# Patient Record
Sex: Male | Born: 1950 | Race: White | Hispanic: No | Marital: Married | Smoking: Current every day smoker
Health system: Southern US, Community
[De-identification: ages and names within clinical notes are randomized; demographics above are authoritative.]

---

## 2000-04-16 ENCOUNTER — Inpatient Hospital Stay (HOSPITAL_COMMUNITY): Admission: EM | Admit: 2000-04-16 | Discharge: 2000-04-17 | Payer: Self-pay | Admitting: Emergency Medicine

## 2000-04-16 ENCOUNTER — Encounter: Payer: Self-pay | Admitting: Orthopedic Surgery

## 2000-04-16 ENCOUNTER — Encounter: Payer: Self-pay | Admitting: *Deleted

## 2000-04-17 ENCOUNTER — Encounter: Payer: Self-pay | Admitting: Cardiology

## 2000-11-18 ENCOUNTER — Ambulatory Visit (HOSPITAL_BASED_OUTPATIENT_CLINIC_OR_DEPARTMENT_OTHER): Admission: RE | Admit: 2000-11-18 | Discharge: 2000-11-18 | Payer: Self-pay | Admitting: Internal Medicine

## 2002-12-22 ENCOUNTER — Encounter: Payer: Self-pay | Admitting: Orthopedic Surgery

## 2002-12-22 ENCOUNTER — Encounter: Admission: RE | Admit: 2002-12-22 | Discharge: 2002-12-22 | Payer: Self-pay | Admitting: Orthopedic Surgery

## 2002-12-23 ENCOUNTER — Ambulatory Visit (HOSPITAL_BASED_OUTPATIENT_CLINIC_OR_DEPARTMENT_OTHER): Admission: RE | Admit: 2002-12-23 | Discharge: 2002-12-23 | Payer: Self-pay | Admitting: Orthopedic Surgery

## 2003-03-02 ENCOUNTER — Encounter (INDEPENDENT_AMBULATORY_CARE_PROVIDER_SITE_OTHER): Payer: Self-pay | Admitting: *Deleted

## 2003-03-02 ENCOUNTER — Ambulatory Visit (HOSPITAL_BASED_OUTPATIENT_CLINIC_OR_DEPARTMENT_OTHER): Admission: RE | Admit: 2003-03-02 | Discharge: 2003-03-02 | Payer: Self-pay | Admitting: Orthopedic Surgery

## 2003-07-20 ENCOUNTER — Ambulatory Visit (HOSPITAL_COMMUNITY): Admission: RE | Admit: 2003-07-20 | Discharge: 2003-07-20 | Payer: Self-pay | Admitting: General Surgery

## 2003-07-20 ENCOUNTER — Encounter (INDEPENDENT_AMBULATORY_CARE_PROVIDER_SITE_OTHER): Payer: Self-pay | Admitting: *Deleted

## 2004-01-02 ENCOUNTER — Inpatient Hospital Stay (HOSPITAL_COMMUNITY): Admission: EM | Admit: 2004-01-02 | Discharge: 2004-01-09 | Payer: Self-pay | Admitting: Emergency Medicine

## 2004-01-09 ENCOUNTER — Inpatient Hospital Stay (HOSPITAL_COMMUNITY)
Admission: RE | Admit: 2004-01-09 | Discharge: 2004-01-13 | Payer: Self-pay | Admitting: Physical Medicine & Rehabilitation

## 2004-01-26 ENCOUNTER — Encounter: Admission: RE | Admit: 2004-01-26 | Discharge: 2004-01-26 | Payer: Self-pay | Admitting: Internal Medicine

## 2004-01-27 ENCOUNTER — Inpatient Hospital Stay (HOSPITAL_COMMUNITY): Admission: EM | Admit: 2004-01-27 | Discharge: 2004-02-02 | Payer: Self-pay | Admitting: Emergency Medicine

## 2004-02-21 ENCOUNTER — Ambulatory Visit (HOSPITAL_COMMUNITY): Admission: RE | Admit: 2004-02-21 | Discharge: 2004-02-21 | Payer: Self-pay | Admitting: Orthopedic Surgery

## 2004-08-28 ENCOUNTER — Ambulatory Visit (HOSPITAL_COMMUNITY): Admission: RE | Admit: 2004-08-28 | Discharge: 2004-08-28 | Payer: Self-pay | Admitting: Orthopedic Surgery

## 2005-05-12 ENCOUNTER — Encounter: Admission: RE | Admit: 2005-05-12 | Discharge: 2005-05-12 | Payer: Self-pay | Admitting: Orthopedic Surgery

## 2005-05-15 ENCOUNTER — Ambulatory Visit (HOSPITAL_BASED_OUTPATIENT_CLINIC_OR_DEPARTMENT_OTHER): Admission: RE | Admit: 2005-05-15 | Discharge: 2005-05-15 | Payer: Self-pay | Admitting: *Deleted

## 2005-05-15 ENCOUNTER — Ambulatory Visit (HOSPITAL_COMMUNITY): Admission: RE | Admit: 2005-05-15 | Discharge: 2005-05-15 | Payer: Self-pay | Admitting: *Deleted

## 2005-06-29 IMAGING — CR DG CHEST 2V
3 series · 3 of 3 positions shown · non-contrast
Comparison: none

CLINICAL DATA: Shortness of breath.
TWO-VIEW CHEST
AP upright and lateral views compared to an earlier portable.
Heart size upper normal.  No congestive heart failure.  No definite right lower lobe pneumonia, as was questioned previously.
IMPRESSION 
No definite active process.  No definite  right lower lobe pneumonia .

[view not recorded (1 of 3)]
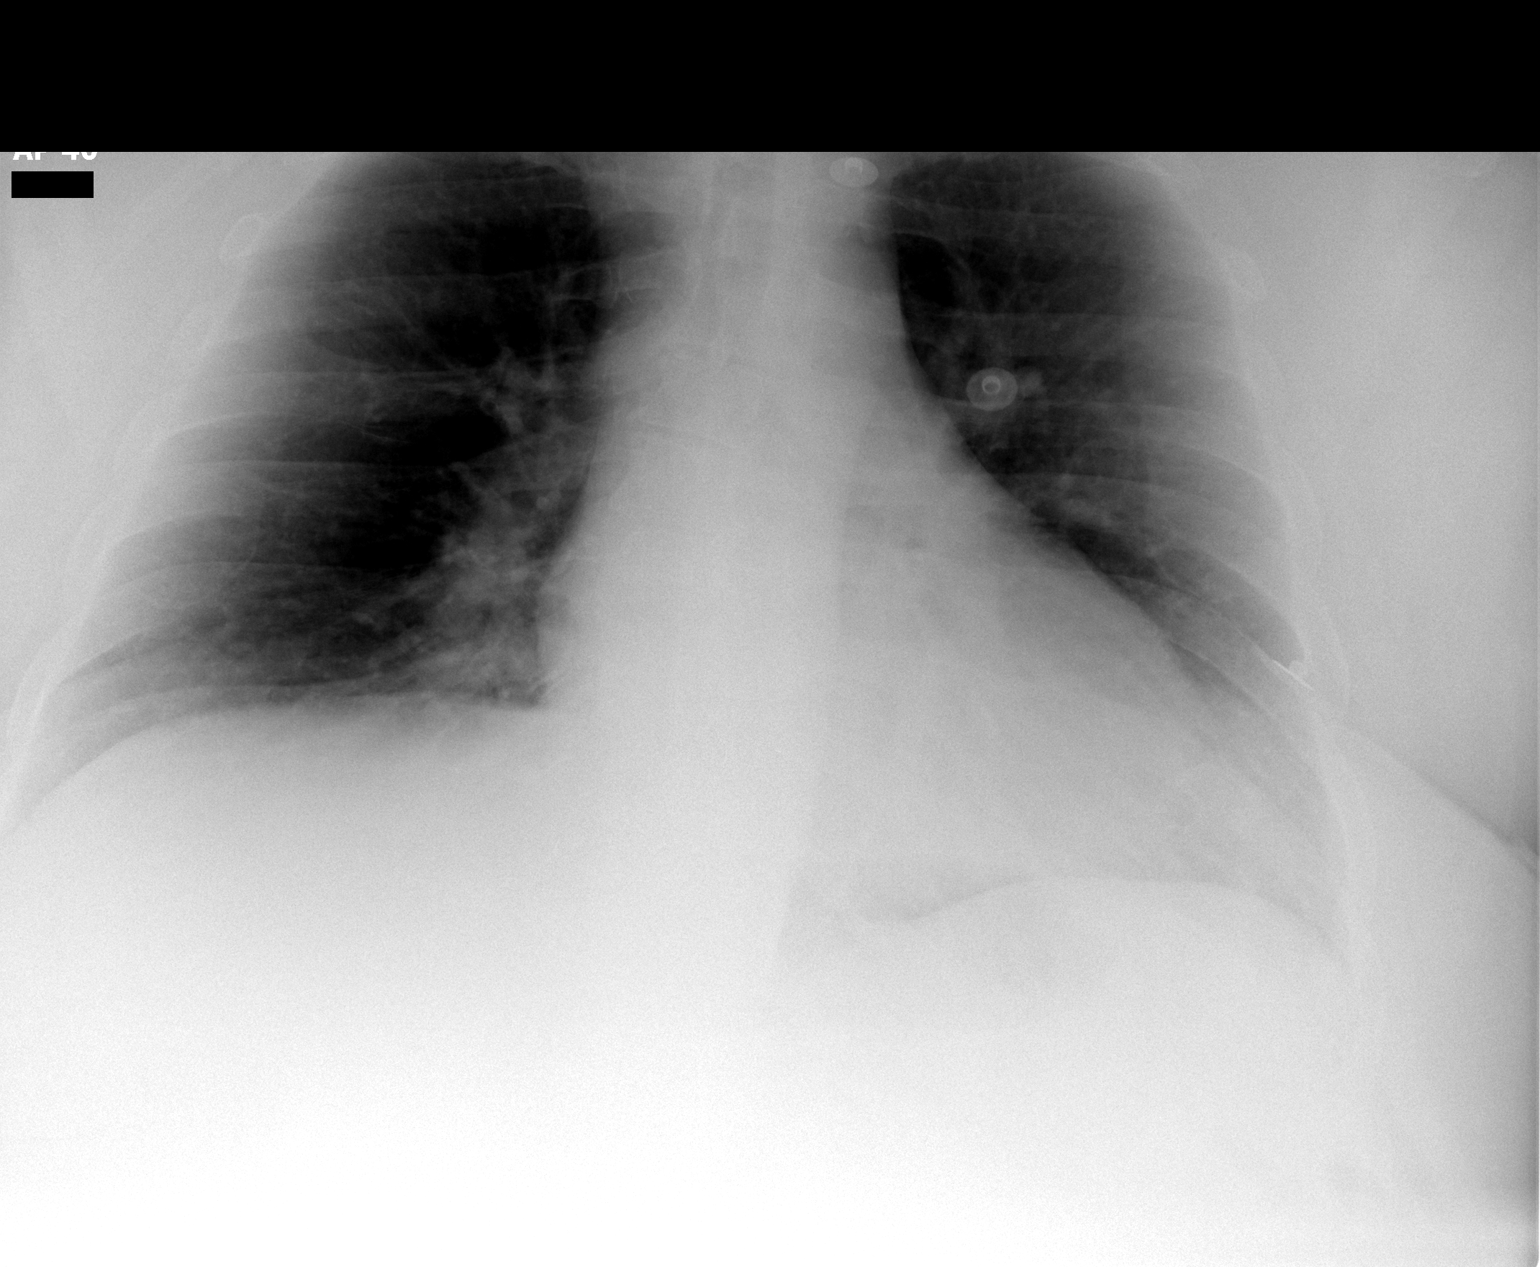

[view not recorded (2 of 3)]
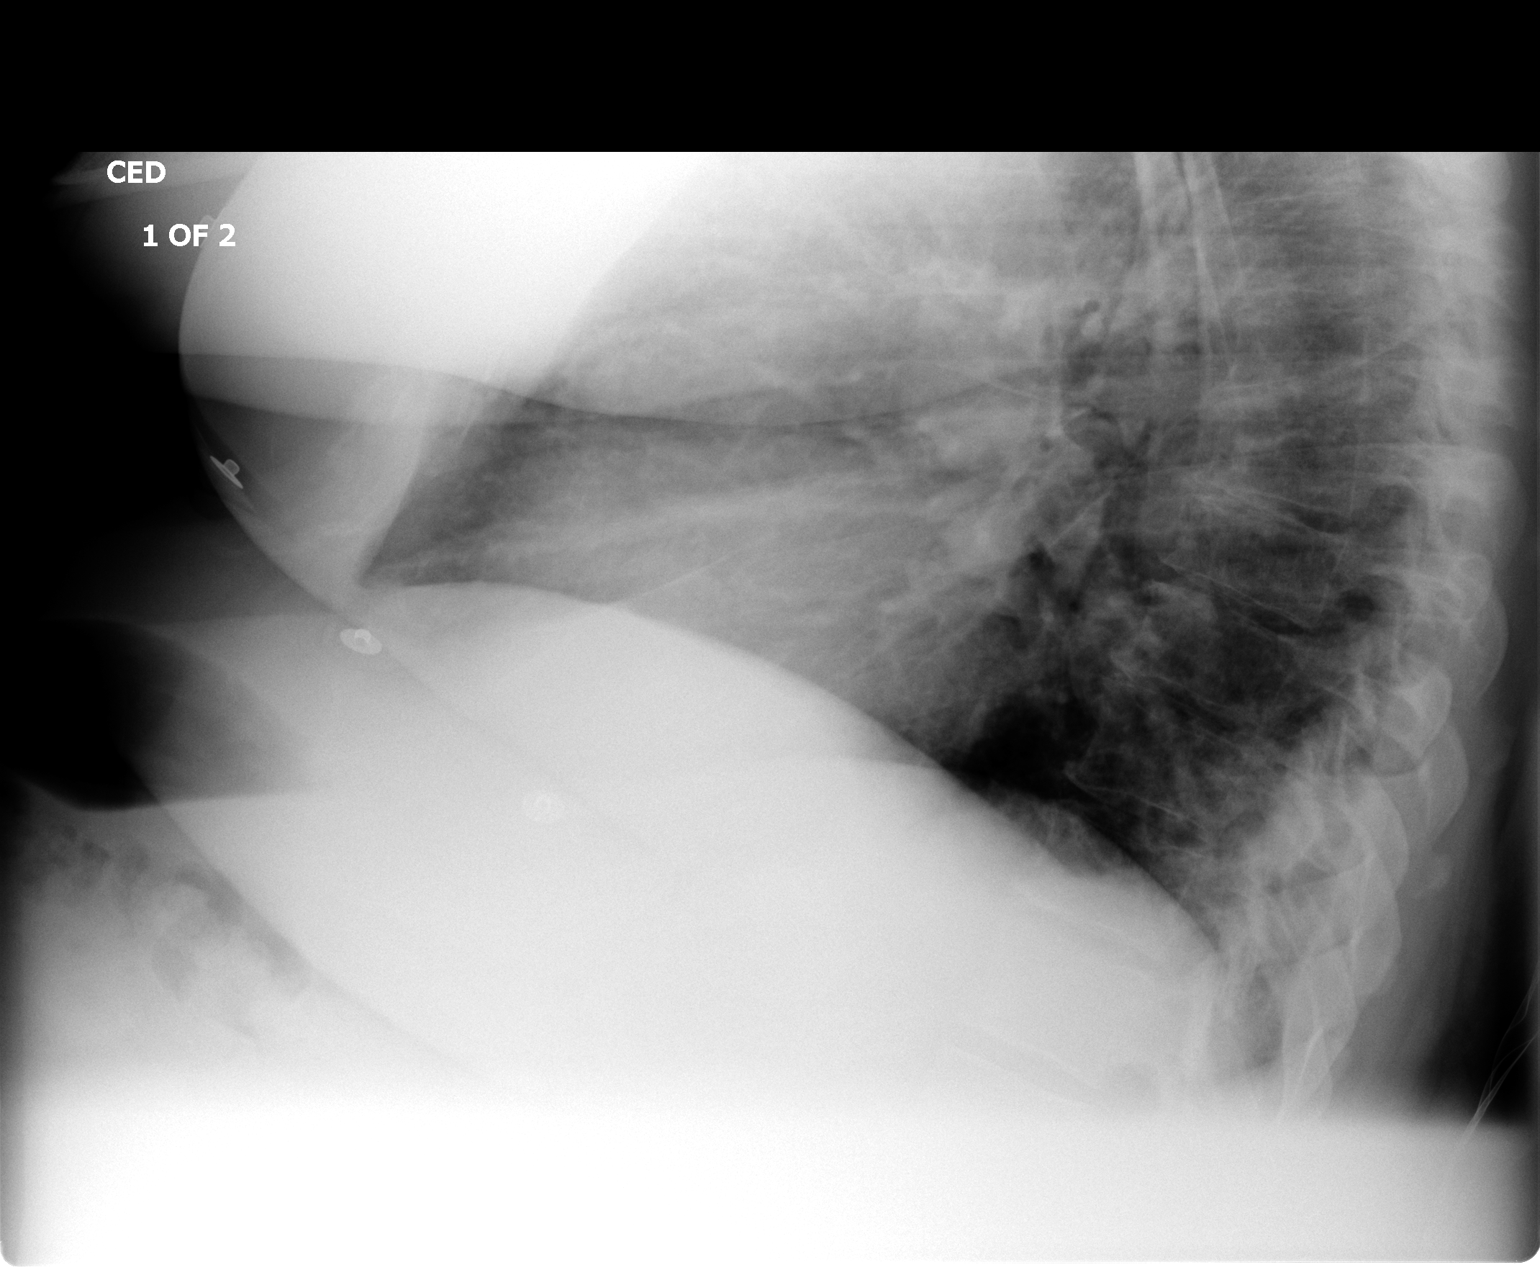

[view not recorded (3 of 3)]
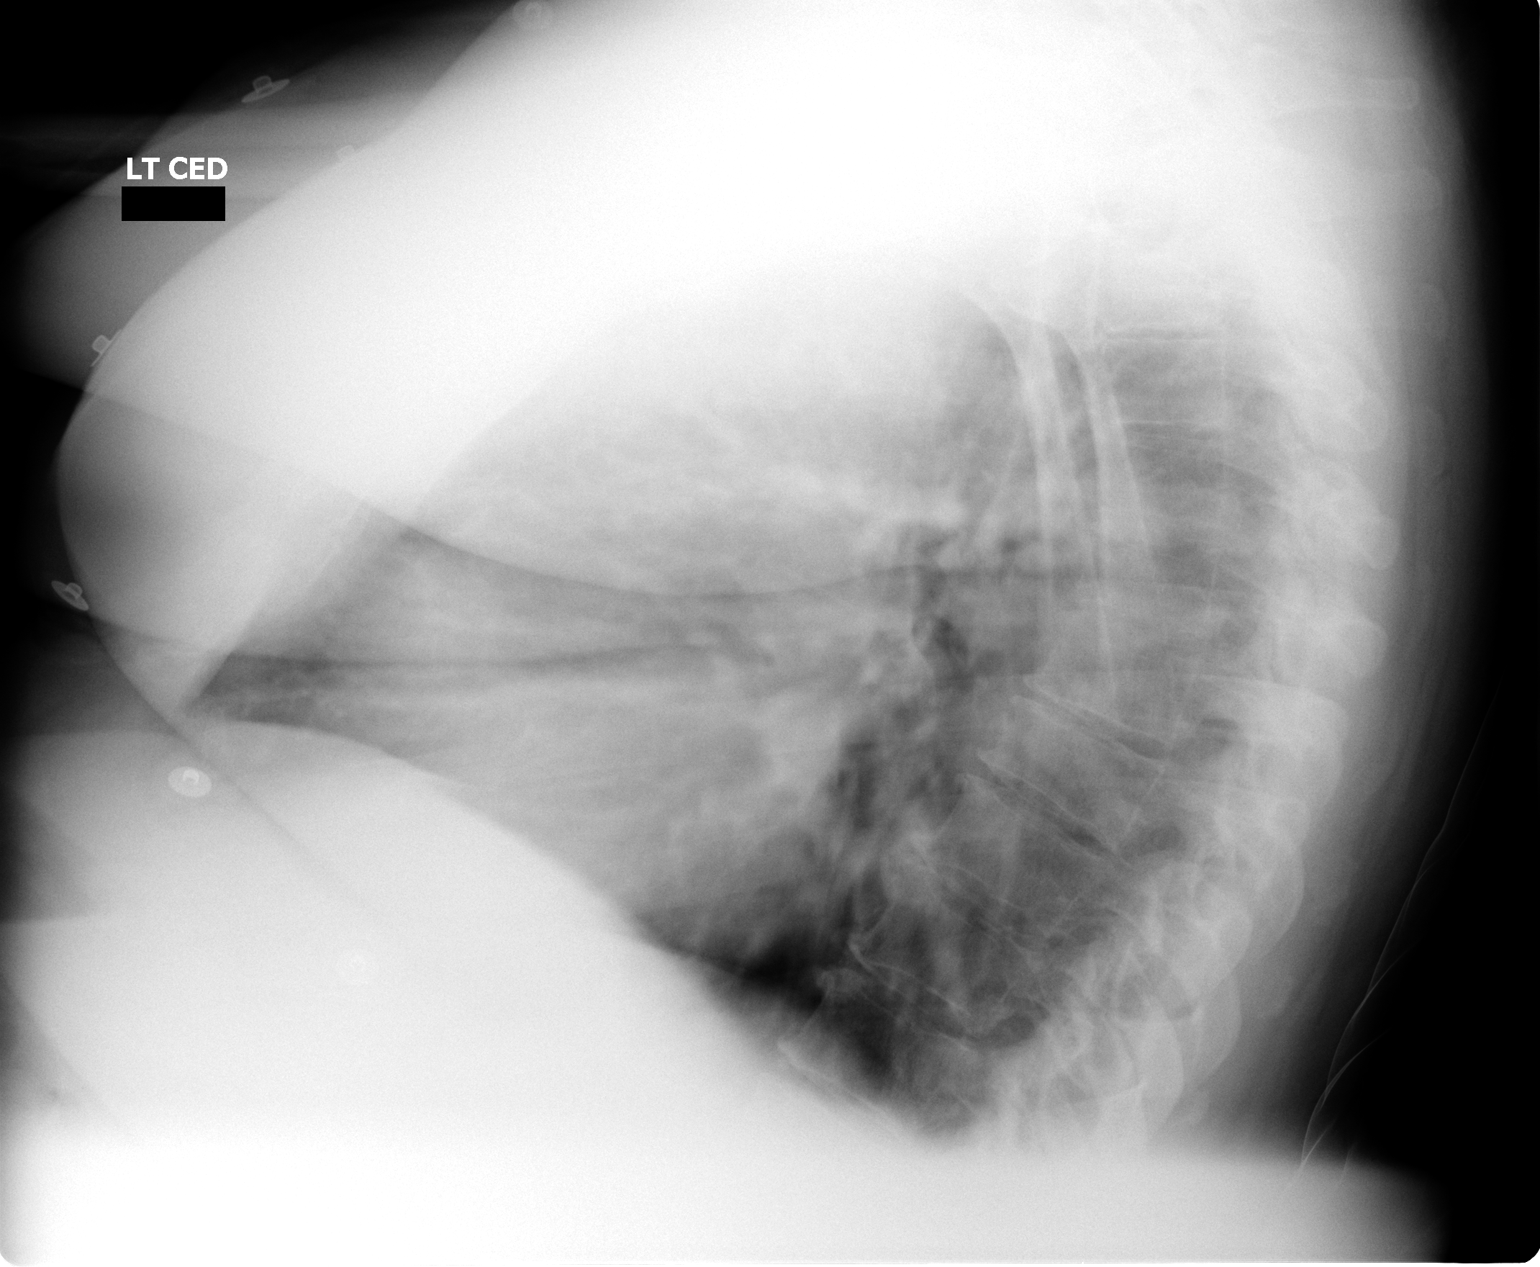

[3 of 3 positions shown; findings below may reference images not displayed]

## 2005-07-04 IMAGING — CR DG TIBIA/FIBULA 2V*L*
2 series · 2 of 2 positions shown · non-contrast
Comparison: none

CLINICAL DATA: Tibial fracture with rod placement.
 LEFT TIBIA AND FIBULA
 There is an oblique fracture of the proximal shaft of the left tibia with an intramedullary rod present.  When compared with the prior study dated 01/04/04, there has been no significant change in position and alignment of the tibial fracture.  There is mild callus formation now seen.  There is also a fracture of the left fibular head which is near anatomically aligned in position with slight callus formation seen associated with this fracture as well.  Intramedullary rod appears intact.  
 IMPRESSION
 1.  Fracture of the proximal left tibial shaft without significant change in positioning and alignment when compared with the 01/04/04 study.  There is mild callus formation noted.  
 2.  Proximal left fibular fracture with mild callus formation present as well.

[view not recorded (1 of 2)]
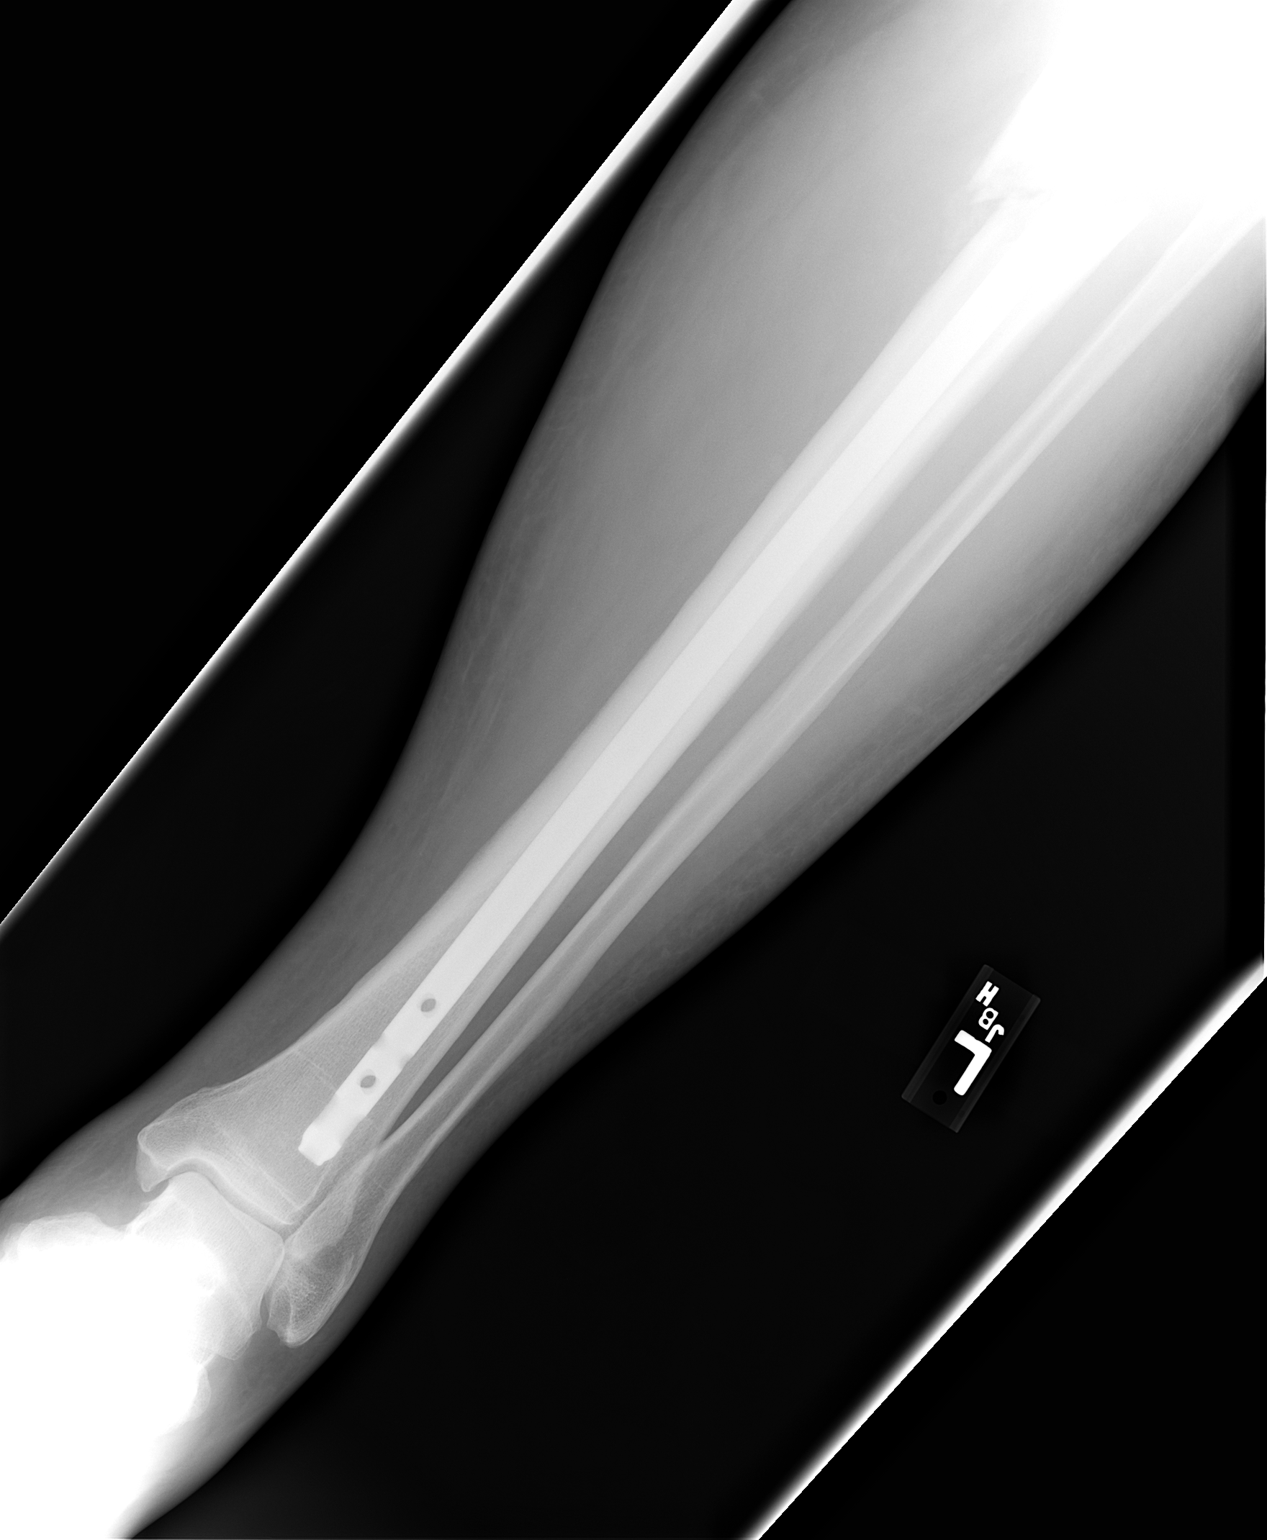

[view not recorded (2 of 2)]
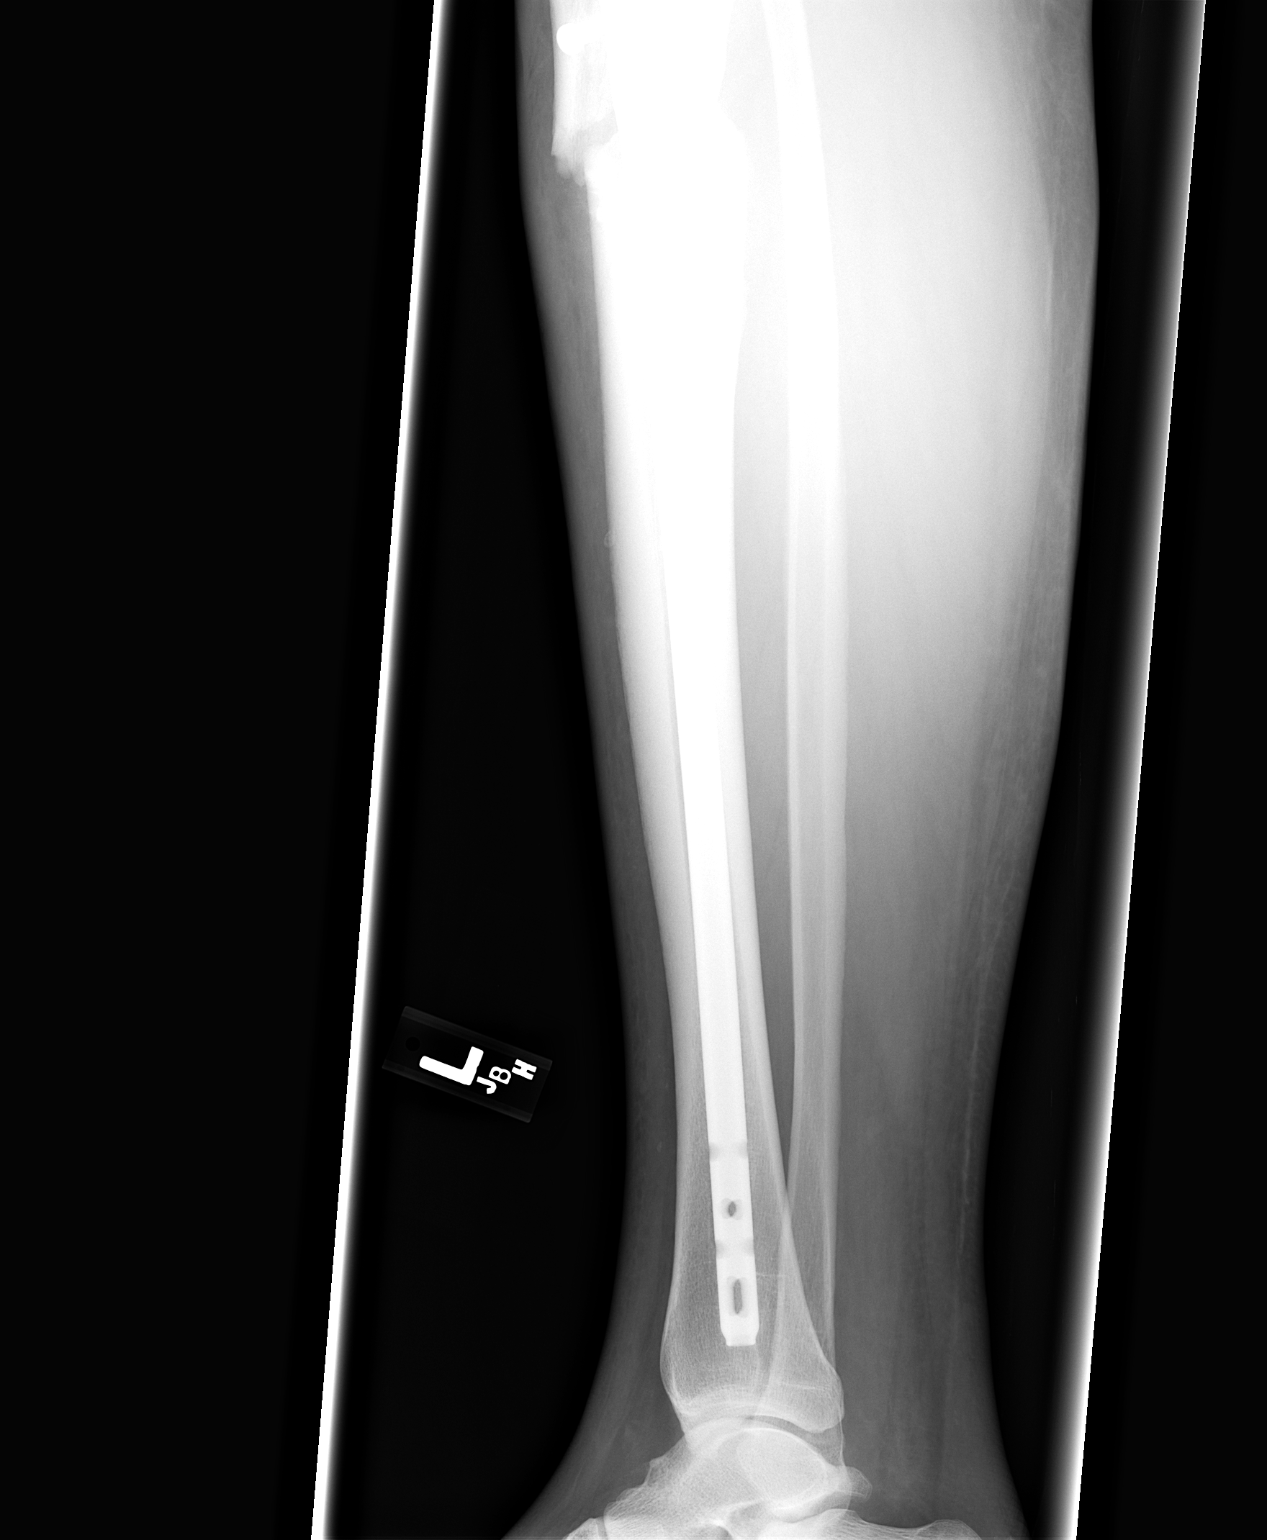

[2 of 2 positions shown; findings below may reference images not displayed]

## 2005-12-08 ENCOUNTER — Emergency Department (HOSPITAL_COMMUNITY): Admission: EM | Admit: 2005-12-08 | Discharge: 2005-12-08 | Payer: Self-pay | Admitting: Emergency Medicine

## 2009-04-24 ENCOUNTER — Encounter: Admission: RE | Admit: 2009-04-24 | Discharge: 2009-04-24 | Payer: Self-pay | Admitting: Orthopedic Surgery

## 2010-12-13 NOTE — Op Note (Signed)
NAMEMAOR, MECKEL NO.:  [id number]   MEDICAL RECORD NO.:  [id number]          PATIENT TYPE:  OIB   LOCATION:  2889                         FACILITY:  [facility]   PHYSICIAN:  Loreta Ave, M.D. DATE OF BIRTH:  [birth year]   DATE OF PROCEDURE:  08/28/2004  DATE OF DISCHARGE:                                 OPERATIVE REPORT   PREOPERATIVE DIAGNOSES:  1.  Status post open reduction, internal fixation left proximal tibial      fracture with an IM rod with 2 proximal interlock screws.  2.  Posttraumatic arthrofibrosis left knee.   POSTOPERATIVE DIAGNOSES:  1.  Status post open reduction, internal fixation left proximal tibial      fracture with an IM rod with 2 proximal interlock screws.  2.  Posttraumatic arthrofibrosis left knee.  3.  Healed fracture.   PROCEDURE:  1.  Left knee exam under anesthesia.  2.  Gentle manipulation.  3.  Open removal of symptomatic proximal interlocking screws x2.   SURGEON:  Loreta Ave, M.D.   ASSISTANT:  Genene Churn. Denton Meek.   ANESTHESIA:  General.   BLOOD LOSS:  Minimal.   SPECIMENS:  None.   CULTURES:  None.   COMPLICATIONS:  None.   DRESSINGS:  Sterile compressive dressing.   Note:  Removed hardware was given to the patient.   PROCEDURE:  The patient was brought to the operating room and after adequate  anesthesia had been obtained his left knee was examined.  Alignment-slight  varus, not at the knee, but at the fracture site and slight flexion of the  fracture site.  The knee, itself had some adhesions, but I could get less  than 5-degrees of flexion contracture, almost full extension, still a little  bit of flexion at the fracture site.  Excellent stability.  Full further  flexion.   Prepped and draped in the usual sterile fashion.  I used the 2 incisions  over the proximal, medial, tibial screws.  The skin and subcutaneous tissue  divided.  Blunt and sharp dissection used to expose the tibia.  A  little  clear fluid around the distal screw from bursal swelling which was excised.  Both screws were then identified and removed in their entirety without  difficulty.   The wounds were irrigated and closed with Vicryl and Nylon.  Marginal edges  of the wound injected with Marcaine.  Sterile compressive dressing applied.  Anesthesia reversed.  Brought to the recovery room.  Tolerated surgery well,  no complications.      DFM/MEDQ  D:  08/28/2004  T:  08/28/2004  Job:  [id number]

## 2010-12-13 NOTE — Op Note (Signed)
NAME:  Darren Hunt, Darren Hunt                           ACCOUNT NO.:  [id number]   MEDICAL RECORD NO.:  [id number]                   PATIENT TYPE:  INP   LOCATION:  5742                                 FACILITY:  [facility]   PHYSICIAN:  Loreta Ave, M.D.              DATE OF BIRTH:  November 13, 1950   DATE OF PROCEDURE:  01/04/2004  DATE OF DISCHARGE:                                 OPERATIVE REPORT   PREOPERATIVE DIAGNOSES:  1. Displaced unstable proximal tibial shaft and fibular shaft fractures,     left.  2. Nondisplaced fibular shaft fracture, right.   POSTOPERATIVE DIAGNOSES:  1. Displaced unstable proximal tibial shaft and fibular shaft fractures,     left.  2. Nondisplaced fibular shaft fracture, right.   OPERATION PERFORMED:  1. Examination under anesthesia, right knee and assessment of fracture there     with closed treatment.  2. Reduction and fixation of tibial shaft fracture on the left with a Smith-     Nephew 10 mm by 38 cm intramedullary nail with proximal interlock screws     times two.  Closed reduction of fibular shaft fracture.   SURGEON:  Loreta Ave, M.D.   ASSISTANT:  Arlys John D. Petrarca, P.A.-C.   ANESTHESIA:  General.   ESTIMATED BLOOD LOSS:  Minimal.   TOURNIQUET TIME:  One hour.   SPECIMENS:  None.   CULTURES:  None.   COMPLICATIONS:  None.   DRESSING:  Soft compressive with short leg splint on the left.   DESCRIPTION OF PROCEDURE:  The patient was brought to the operating room and  after adequate anesthesia had been obtained, the right knee examined.  Full  motion, good stability.  All ligaments stable.  Attention was turned to the  left where there was posterior instability of the proximal tibia and fibular  shaft fracture.  Tourniquet applied upper aspect of leg.  Prepped and draped  in the usual sterile fashion.  Exsanguinated with elevation and Esmarch.  Tourniquet inflated to 350 mmHg.  Straight incision of patella to tibial  tubercle.  Skin  and subcutaneous tissue, patellar tendon incised.  Tibia  exposed.  The canal entered.  A guidewire was passed across the proximal  tibia and the fracture reduced and then down into the distal tibia with  fluoroscopic guidance.  Reaming up to 11.5 mm for a 10 mm rod which was  measured at 38 cm.  Holding the fracture reduced, the rod was driven down  across the fracture giving good fixation within the shaft distally.  With  the appropriate guides, two interlocking screws were placed proximally under  fluoroscopic guidance, predrilled and then two 30 mm screws were utilized.  The overall construct was examined.  Very good reduction of the tibia and  the fibular fractures restoring normal rotation and length.  Although a  little bit of rotation was allowable distally, I elected not to interlock  this distally so that I could get compression at the fracture site where he  would be at risk for nonunion if I locked it statically.  I had reasonable  enough rotational stability and anatomic alignment to allow this approach.  The wound was irrigated.  The patellar tendon was closed with 0 Vicryl.  Skin and subcutaneous tissue with Vicryl and staples.  Other wounds were  closed with staples as well.  Margins of the wound were injected with  Marcaine.  Sterile compressive dressing and short leg splint applied.  Tourniquet deflated and removed.  Anesthesia reversed.  Brought to recovery  room.  Tolerated surgery well.  No complications.                                               Loreta Ave, M.D.    DFM/MEDQ  D:  01/04/2004  T:  01/05/2004  Job:  [id number]
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
## 2010-12-13 NOTE — Op Note (Signed)
NAME:  Darren Hunt, Darren Hunt                           ACCOUNT NO.:  [id number]   MEDICAL RECORD NO.:  [id number]                   PATIENT TYPE:  AMB   LOCATION:  DSC                                  FACILITY:  [facility]   PHYSICIAN:  Loreta Ave, M.D.              DATE OF BIRTH:  Jul 12, 1951   DATE OF PROCEDURE:  03/02/2003  DATE OF DISCHARGE:                                 OPERATIVE REPORT   PREOPERATIVE DIAGNOSES:  1. Right carpal tunnel syndrome.  2. Symptomatic ganglion cyst, dorsal aspect phalangeal interphalangeal joint     right long finger.   POSTOPERATIVE DIAGNOSES:  1. Right carpal tunnel syndrome.  2. Symptomatic ganglion cyst, dorsal aspect phalangeal interphalangeal joint     right long finger.   PROCEDURE:  1. Right carpal tunnel release.  2. Excision ganglion cyst, PIP joint long finger, dorsal aspect PIP joint.   SURGEON:  Loreta Ave, M.D.   ASSISTANT:  Arlys John D. Petrarca, P.A.-C.   ANESTHESIA:  IV regional.   SPECIMENS:  Excised ganglion.   CULTURES:  None.   COMPLICATIONS:  None.   DRESSING:  Soft compressive with a bulky hand dressing and splint.   DESCRIPTION OF PROCEDURE:  The patient was brought to the operating room and  placed on the operating table in the supine position. After adequate  anesthesia had been obtained the tourniquet was applied to the upper  aspect  of the right arm. The patient was prepped and draped in the usual sterile  fashion and exsanguinated with elevation and Esmarch. The tourniquet was  inflated.   The carpal tunnel was approached with a curved incision of the carpal tunnel  extending slightly ulnarward at the distal wrist crease. The skin and  subcutaneous tissue were divided. The retinaculum over the  carpal tunnel  were visualized and incised under direct visualization from the forearm  fascia proximally to the palmar arch distally.   Thickened chronic  tenosynovial tissue  in the canal was decompressed. The  median nerve had moderate to marked constriction with hourglass constriction  to the middle of the canal. This improved  after  carpal tunnel release and  epineurotomy. The entire median nerve, digital branches and bundle branches  were identified, protected and decompressed. The wound was irrigated. The  wound was closed with nylon. The margins were injected with Marcaine.   Attention was then turned to the long finger. Over the dorsal aspect of the  PIP joint there was a 5 to 6-mm well circumscribed cyst just  ulnar to the  midline. Approached with a transverse incision. The cyst was immediately  evident and excised in its entirety, arising from the PIP joint between  the  central slip and the lateral bands of  the extensor tendon. These were both  protected and maintained throughout.   A small window was made in the capsule at the site of excision  after it was  excised in its entirety. The wound was irrigated and closed with nylon. The  margins were injected with Marcaine without epinephrine. A sterile  compressive dressing with a bulky hand dressing  and splint were applied.   Anesthesia was reversed. The patient was brought to the recovery room. He  tolerated the surgery well without complications.                                                 Loreta Ave, M.D.    DFM/MEDQ  D:  03/02/2003  T:  03/03/2003  Job:  [id number]
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
## 2010-12-13 NOTE — Discharge Summary (Signed)
[facility]  Patient:    Darren Hunt, Darren Hunt                        MRN: [id number] Adm. Date:  [id number] Disc. Date: [id number] Attending:  Colbert Ewing Dictator:   Lavella Hammock, P.A.-C. CC:         Loreta Ave, M.D., [facility]  Dr. Katrinka Blazing, Primary Care, [place], [facility]   Referring Physician Discharge [place]  DATE OF BIRTH:  06-08-1951  PROCEDURE:  Dobutamine echocardiogram.  HOSPITAL COURSE:  [name] is a 60 year old male with no known history of coronary artery disease who had knee surgery on the day of [facility].  He denied feeling short of breath; he just remembers waking up with knee pain.  His oxygen saturation decreased into the 80s and he was wheezing. Chest x-ray showed mild CHF.  He was transported to [facility] and received 20 mg of IV Lasix.  He was seen by the cardiologist per Dr. Helaine Chess request and admitted for further evaluation.  He ruled out for an MI and he lost approximately 1.7 pounds in 24 hours.  It was felt that he has possibly sleep apnea as well as obesity hypoventilation syndrome.  He also had mild pulmonary edema.  His EKG was repeated and had no significant abnormalities.  He had a dobutamine echo, which showed no significant abnormalities.  Because of this and because his symptoms had resolved he was considered stable for discharge on April 17, 2000.  He was seen by Dr. Eulah Pont in the hospital and is to follow up with Dr. Eulah Pont next week.  LABORATORY DATA:  Chest x-ray:  Heart size upper limits of normal, currently no edema.  Hemoglobin 17.1, hematocrit 49.1, WBC 11.4 and platelets 229,000.  Sodium 135, potassium 4.1, chloride 95, CO2 30, BUN 15, creatinine 0.8, and glucose 104. Serial CK MBs and troponin Is negative.  Total cholesterol 162, triglycerides 158, HDL 32 and LDL 98.  TSH 0.648.  DISCHARGE CONDITION:  Improved.  CONSULTS:  Loreta Ave, M.D. with orthopedics.  COMPLICATIONS:  None.  DISCHARGE DIAGNOSES: 1. Mild congestive heart failure, probably multifactorial. 2. Possible obstructive sleep apnea. 3. 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 4. Status post arthroscopic knee surgery on the right, April 17, 2000. 5. History of two previous knee surgeries secondary to degenerative joint    disease. 6. History of asthma. 7. History of nausea secondary to Vicodin. 8. Ongoing tobacco use.  DISCHARGE INSTRUCTIONS: 1. His activity level is to be determined by orthopedics. 2. He is to follow a low-fat diet. 3. He is to call the office if he has any additional difficulties. 4. He is to follow up with Dr. Eulah Pont as scheduled. 5. He is to have a sleep study arranged through Dr. Katrinka Blazing. 6. He is to follow up with Dr. Katrinka Blazing and call for an appointment. 7. He is to follow up with Dr. Antoine Poche and call for an appointment.  DISCHARGE MEDICATIONS: 1. Lortab p.r.n. 2. Coated aspirin 325 mg q. day. DD:  04/17/00 TD:  04/17/00 Job: [id number]

## 2010-12-13 NOTE — Discharge Summary (Signed)
NAME:  [name], Darren Hunt                           ACCOUNT NO.:  [id number]   MEDICAL RECORD NO.:  [id number]                   PATIENT TYPE:  IPS   LOCATION:  4006                                 FACILITY:  [facility]   PHYSICIAN:  Ellwood Dense, M.D.                DATE OF BIRTH:  [birth year]   DATE OF ADMISSION:  01/09/2004  DATE OF DISCHARGE:  01/12/2004                                 DISCHARGE SUMMARY   ADDENDUM:  Admission date remains the same, June 14. Discharge was changed  from June 17 to June 18 to entertain the transportation needed for discharge  to home. His remaining hospital course remained unchanged.      Mariam Dollar, P.A.                     Ellwood Dense, M.D.    DA/MEDQ  D:  01/11/2004  T:  01/12/2004  Job:  [id number]

## 2010-12-13 NOTE — Op Note (Signed)
   NAME:  Darren Hunt, Darren Hunt                           ACCOUNT NO.:  [id number]   MEDICAL RECORD NO.:  [id number]                   PATIENT TYPE:  AMB   LOCATION:  DSC                                  FACILITY:  [facility]   PHYSICIAN:  Loreta Ave, M.D.              DATE OF BIRTH:  April 22, 1951   DATE OF PROCEDURE:  12/23/2002  DATE OF DISCHARGE:                                 OPERATIVE REPORT   PREOPERATIVE DIAGNOSIS:  Left carpal tunnel syndrome.   POSTOPERATIVE DIAGNOSIS:  Left carpal tunnel syndrome.   OPERATIVE PROCEDURE:  Left carpal tunnel release.   SURGEON:  Loreta Ave, M.D.   ASSISTANT:  Arlys John D. Petrarca, P.A.-C.   ANESTHESIA:  IV regional.   SPECIMENS:  None.   CULTURES:  None.   COMPLICATIONS:  None.   DRESSING:  Soft compressive with bulky hand dressing and splint.   PROCEDURE:  The patient was brought to the operating room and placed on the  operating table in the supine position. After adequate anesthesia had been  obtained, the left arm was prepped and draped in the usual sterile fashion.  A small incision over the thenar eminence margin heading slightly ulnarward  at the distal wrist crease.  Skin and subcutaneous tissue divided avoiding  injury to the palmar branch of the median nerve.  Retinaculum over the  carpal tunnel identified and incised under direct visualization from the  forearm fascia to approximately the palmar arch distally.  Digital branch  and motor branches identified, protected, and decompressed.  Epineurotomy.  Moderate-to-marked constriction of the nerve in the middle of the canal  improved after epineurotomy and carpal tunnel release.  No other  abnormalities seen.  Wound irrigated  Skin closed with nylon.  Margins of  the wound injected with Marcaine.  Sterile compressive dressing with bulky  hand dressing and splint applied.  Anesthesia reversed.  Brought to the  recovery room.  Tolerated surgery well with no complications.                                               Loreta Ave, M.D.    DFM/MEDQ  D:  12/23/2002  T:  12/24/2002  Job:  [id number]

## 2010-12-13 NOTE — Op Note (Signed)
NAME:  Darren Hunt, Darren Hunt                           ACCOUNT NO.:  [id number]   MEDICAL RECORD NO.:  [id number]                   PATIENT TYPE:  AMB   LOCATION:  DAY                                  FACILITY:  [facility]   PHYSICIAN:  Angelia Mould. Derrell Lolling, M.D.             DATE OF BIRTH:  1950-10-16   DATE OF PROCEDURE:  07/20/2003  DATE OF DISCHARGE:                                 OPERATIVE REPORT   PREOPERATIVE DIAGNOSIS:  Chronic soft tissue infection, abdominal wall.   POSTOPERATIVE DIAGNOSIS:  Chronic soft tissue infection, abdominal wall.   OPERATION PERFORMED:  Excision of 8 x 2.5 cm area of skin and subcutaneous  tissue of the abdominal wall (left lower quadrant).   SURGEON:  Angelia Mould. Derrell Lolling, M.D.   OPERATIVE INDICATION:  This is a 60 year old morbidly obese white man who  has had intermittent pain, swelling, and drainage from an area of the skin  in the left lower quadrant of his abdominal wall for 1-1/2 years.  He has  been on antibiotics at least twice, and it will improve but then will recur.  On exam he is a pleasant gentleman who weighs 350 pounds.  His abdomen is  obese, soft, and nontender.  In the left lower quadrant above the inguinal  crease there is an area of chronic skin discoloration and an open draining  sinus.  I do not see any active cellulitis or inguinal adenopathy, and there  is no hernia.  He is brought to the operating room electively for excision  of this area.   OPERATIVE TECHNIQUE:  The patient was placed supine on the operating table.  He was monitored and sedated by the anesthesia department.  The abdominal  wall was prepped and draped in a sterile fashion.  The open area had minimal  drainage but was cultured for routine culture.  A transverse elliptical  incision was made 8 cm long by 2.5 cm wide.  We excised skin and  subcutaneous tissue.  The infection process was relatively superficial.  There was no underlying necrotic fat and no adenopathy.  The  specimen was  sent for routine histology.  Hemostasis was excellent and achieved with  electrocautery.  The wound was copiously irrigated with saline.  I closed  the wound loosely with three interrupted sutures of 3-0 nylon.  Clean  bandages were placed and the patient taken to the recovery room in stable  condition.  Estimated blood loss was about 10 mL.  Complications:  None.  Sponge, needle, and instrument counts were correct.                                               Angelia Mould. Derrell Lolling, M.D.    HMI/MEDQ  D:  07/20/2003  T:  07/20/2003  Job:  [id number]   cc:   Ike Bene, M.D.  [address]  [place]  [place] [address]  Fax: [phone]

## 2010-12-13 NOTE — Op Note (Signed)
Darren Hunt, NIJJAR NO.:  [id number]   MEDICAL RECORD NO.:  [id number]          PATIENT TYPE:  AMB   LOCATION:  DSC                          FACILITY:  [facility]   PHYSICIAN:  Loreta Ave, M.D. DATE OF BIRTH:  Dec 29, 1950   DATE OF PROCEDURE:  05/15/2005  DATE OF DISCHARGE:                                 OPERATIVE REPORT   PREOPERATIVE DIAGNOSIS:  Symptomatic painful ganglion cyst, dorsal aspect  right hand ring finger PIP joint.   POSTOPERATIVE DIAGNOSIS:  Symptomatic painful ganglion cyst, dorsal aspect  right hand ring finger PIP joint.   OPERATIVE PROCEDURE:  Excision of symptomatic painful cyst, dorsal aspect  PIP joint right ring finger.   SURGEON:  Loreta Ave, M.D.   ASSISTANT:  Genene Churn. Barry Dienes, PA.   ANESTHESIA:  IV regional.   SPECIMENS:  Excised ganglion.   CULTURES:  None.   COMPLICATIONS:  None.   DRESSING:  Self-compressive.   DESCRIPTION OF PROCEDURE:  The patient was brought to the operating room and  after adequate anesthesia had been obtained, he was prepped and draped in  the usual sterile fashion.  The cyst, which was on the dorsal aspect of the  PIP joint ring finger, slightly ulnar, was approached with a transverse  incision and excised in it's entirety.  This was traced down through a  longitudinal split in the central slip extensor tendon apparatus and  completely excised the cyst and the neck coming out of the joint.  Extensor  tendon protected throughout.  Care was taken to be sure the cyst was excised  in it's entirety, a very benign appearance.  The wound was irrigated and  then closed with interrupted nylon.  A sterile compression dressing was  applied.  Anesthesia was reversed.  He was brought to the recovery room.  He  tolerated the surgery well with no complications.      Loreta Ave, M.D.  Electronically Signed     DFM/MEDQ  D:  05/15/2005  T:  05/15/2005  Job:  [id number]

## 2010-12-13 NOTE — Discharge Summary (Signed)
Darren Hunt, Darren Hunt                           ACCOUNT NO.:  [id number]   MEDICAL RECORD NO.:  [id number]                   PATIENT TYPE:  INP   LOCATION:  5523                                 FACILITY:  [facility]   PHYSICIAN:  Ike Bene, M.D.              DATE OF BIRTH:  October 27, 1950   DATE OF ADMISSION:  01/27/2004  DATE OF DISCHARGE:  02/02/2004                                 DISCHARGE SUMMARY   DISCHARGE DIAGNOSES:  1. Deep venous thrombosis.  2. Pulmonary embolism.  3. Congestive heart failure.  4. Hypertension.  5. Morbid obesity.   Mr. Centrella was admitted acutely on July 2 after developing fairly severe  dyspnea.  His acute illness was predated by having bilateral tibial  fractures with surgical repair.  He was on home therapy.  He had been on  Coumadin prior to admission to the hospital.   Despite Coumadin anticoagulation, the patient still developed deep venous  thrombosis and subsequently pulmonary embolism.  It was felt that his INR  was subtherapeutic prior to admission.  Although it was certainly somewhat  elevated, his INR was 1.8 at the time of admission.   The patient was maintained with oxygen support and heparin until his  Coumadin could be increased to the range of 3-4 INR.   The patient had no significant complications.   He has significant sleep apnea and sleeps with CPAP at night that was  arranged during hospitalization.   The patient did develop a thrombocytopenia secondary to heparin and was  changed to Refludan and was maintained on that until his INR was  therapeutic.   The patient had overlapped three full days with therapeutic INR and systemic  anticoagulation.  Decision was made on July 8 that he had been adequately  covered and could be discharged to home.  He is having no significant  dyspnea.  Lung exam was completely normal.  He has been able to maintain  good O2 saturations.   The patient was discharged on the following meds:  1.  Coumadin 10 mg daily except Monday and Thursday he takes 7.5.  2. Lasix 40 mg daily.  3. Kay Ciel  20 mEq daily.  4. Cymbalta 60 mg daily.  5. Atacan 16 mg daily.  6. Morphine 15 mg twice daily as before hospitalization.  7. Oxygen two liters per minute.   The patient was to have his pro time checked by Dr. Merril Abbe four days  after discharge.  He was discharged in markedly improved condition.                                                Ike Bene, M.D.    RM/MEDQ  D:  03/12/2004  T:  03/12/2004  Job:  [id number]
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
## 2012-02-10 DIAGNOSIS — I10 Essential (primary) hypertension: Secondary | ICD-10-CM

## 2012-02-10 DIAGNOSIS — R0602 Shortness of breath: Secondary | ICD-10-CM

## 2012-02-10 DIAGNOSIS — E119 Type 2 diabetes mellitus without complications: Secondary | ICD-10-CM

## 2012-02-10 HISTORY — DX: Shortness of breath: R06.02

## 2012-02-10 HISTORY — DX: Type 2 diabetes mellitus without complications: E11.9

## 2012-02-10 HISTORY — DX: Essential (primary) hypertension: I10

## 2016-07-11 DIAGNOSIS — E1140 Type 2 diabetes mellitus with diabetic neuropathy, unspecified: Secondary | ICD-10-CM

## 2016-07-11 DIAGNOSIS — E782 Mixed hyperlipidemia: Secondary | ICD-10-CM

## 2016-07-11 DIAGNOSIS — G8929 Other chronic pain: Secondary | ICD-10-CM

## 2016-07-11 DIAGNOSIS — E785 Hyperlipidemia, unspecified: Secondary | ICD-10-CM | POA: Insufficient documentation

## 2016-07-11 DIAGNOSIS — M25561 Pain in right knee: Secondary | ICD-10-CM | POA: Insufficient documentation

## 2016-07-11 HISTORY — DX: Other chronic pain: G89.29

## 2016-07-11 HISTORY — DX: Mixed hyperlipidemia: E78.2

## 2016-07-11 HISTORY — DX: Type 2 diabetes mellitus with diabetic neuropathy, unspecified: E11.40

## 2016-08-12 DIAGNOSIS — I723 Aneurysm of iliac artery: Secondary | ICD-10-CM | POA: Insufficient documentation

## 2016-08-12 DIAGNOSIS — I6523 Occlusion and stenosis of bilateral carotid arteries: Secondary | ICD-10-CM | POA: Insufficient documentation

## 2016-08-12 DIAGNOSIS — I878 Other specified disorders of veins: Secondary | ICD-10-CM

## 2016-08-12 HISTORY — DX: Occlusion and stenosis of bilateral carotid arteries: I65.23

## 2016-08-12 HISTORY — DX: Aneurysm of iliac artery: I72.3

## 2016-08-12 HISTORY — DX: Other specified disorders of veins: I87.8

## 2017-05-26 DIAGNOSIS — K529 Noninfective gastroenteritis and colitis, unspecified: Secondary | ICD-10-CM | POA: Insufficient documentation

## 2017-05-26 DIAGNOSIS — G4733 Obstructive sleep apnea (adult) (pediatric): Secondary | ICD-10-CM | POA: Insufficient documentation

## 2017-05-26 HISTORY — DX: Noninfective gastroenteritis and colitis, unspecified: K52.9

## 2017-05-26 HISTORY — DX: Obstructive sleep apnea (adult) (pediatric): G47.33

## 2018-02-01 DIAGNOSIS — M6701 Short Achilles tendon (acquired), right ankle: Secondary | ICD-10-CM

## 2018-02-01 DIAGNOSIS — L84 Corns and callosities: Secondary | ICD-10-CM | POA: Insufficient documentation

## 2018-02-01 DIAGNOSIS — M2040 Other hammer toe(s) (acquired), unspecified foot: Secondary | ICD-10-CM | POA: Insufficient documentation

## 2018-02-01 HISTORY — DX: Corns and callosities: L84

## 2018-02-01 HISTORY — DX: Short Achilles tendon (acquired), right ankle: M67.01

## 2018-02-26 ENCOUNTER — Other Ambulatory Visit: Payer: Self-pay | Admitting: Orthopedic Surgery

## 2018-02-26 DIAGNOSIS — M542 Cervicalgia: Secondary | ICD-10-CM

## 2018-02-26 DIAGNOSIS — M25511 Pain in right shoulder: Secondary | ICD-10-CM

## 2018-02-26 DIAGNOSIS — M75100 Unspecified rotator cuff tear or rupture of unspecified shoulder, not specified as traumatic: Secondary | ICD-10-CM

## 2018-03-05 ENCOUNTER — Ambulatory Visit
Admission: RE | Admit: 2018-03-05 | Discharge: 2018-03-05 | Disposition: A | Discharge status: Home / Self Care | Payer: Self-pay | Source: Ambulatory Visit | Attending: Orthopedic Surgery | Admitting: Orthopedic Surgery

## 2018-03-05 DIAGNOSIS — M542 Cervicalgia: Secondary | ICD-10-CM

## 2018-03-05 DIAGNOSIS — M25511 Pain in right shoulder: Secondary | ICD-10-CM

## 2018-03-05 DIAGNOSIS — M75100 Unspecified rotator cuff tear or rupture of unspecified shoulder, not specified as traumatic: Secondary | ICD-10-CM

## 2018-05-10 DIAGNOSIS — M21071 Valgus deformity, not elsewhere classified, right ankle: Secondary | ICD-10-CM

## 2018-05-10 HISTORY — DX: Valgus deformity, not elsewhere classified, right ankle: M21.071

## 2018-09-28 DIAGNOSIS — B351 Tinea unguium: Secondary | ICD-10-CM | POA: Insufficient documentation

## 2018-09-28 HISTORY — DX: Tinea unguium: B35.1

## 2018-12-29 DIAGNOSIS — G8929 Other chronic pain: Secondary | ICD-10-CM | POA: Insufficient documentation

## 2018-12-29 DIAGNOSIS — M25512 Pain in left shoulder: Secondary | ICD-10-CM | POA: Insufficient documentation

## 2018-12-29 HISTORY — DX: Other chronic pain: G89.29

## 2019-07-06 ENCOUNTER — Encounter: Payer: Self-pay | Admitting: Physician Assistant

## 2019-07-06 ENCOUNTER — Other Ambulatory Visit: Payer: Self-pay

## 2019-07-06 ENCOUNTER — Ambulatory Visit: Payer: Medicare Other | Admitting: Physician Assistant

## 2019-07-06 ENCOUNTER — Ambulatory Visit: Payer: Self-pay

## 2019-07-06 DIAGNOSIS — M1711 Unilateral primary osteoarthritis, right knee: Secondary | ICD-10-CM | POA: Diagnosis not present

## 2019-07-06 DIAGNOSIS — J449 Chronic obstructive pulmonary disease, unspecified: Secondary | ICD-10-CM

## 2019-07-06 DIAGNOSIS — I509 Heart failure, unspecified: Secondary | ICD-10-CM

## 2019-07-06 DIAGNOSIS — Z720 Tobacco use: Secondary | ICD-10-CM

## 2019-07-06 DIAGNOSIS — I2699 Other pulmonary embolism without acute cor pulmonale: Secondary | ICD-10-CM

## 2019-07-06 HISTORY — DX: Other pulmonary embolism without acute cor pulmonale: I26.99

## 2019-07-06 HISTORY — DX: Chronic obstructive pulmonary disease, unspecified: J44.9

## 2019-07-06 HISTORY — DX: Morbid (severe) obesity due to excess calories: E66.01

## 2019-07-06 HISTORY — DX: Heart failure, unspecified: I50.9

## 2019-07-06 HISTORY — DX: Tobacco use: Z72.0

## 2019-07-06 MED ORDER — METHYLPREDNISOLONE ACETATE 40 MG/ML IJ SUSP
40.0000 mg | INTRAMUSCULAR | Status: AC | PRN
  Administered 2019-07-06: 40 mg via INTRA_ARTICULAR

## 2019-07-06 MED ORDER — LIDOCAINE HCL 1 % IJ SOLN
5.0000 mL | INTRAMUSCULAR | Status: AC | PRN
  Administered 2019-07-06: 5 mL

## 2019-07-06 NOTE — Progress Notes (Signed)
Office Visit Note   Patient: Darren Hunt           Date of Birth: 13-Sep-1950           MRN: [id number] Visit Date: 07/06/2019              Requested by: No referring provider defined for this encounter. PCP: Patient, No Pcp Per   Assessment & Plan: Visit Diagnoses:  1. Primary osteoarthritis of right knee     Plan: We will have him monitor his glucose levels over the next few days closely.  He will follow-up as needed for the right knee.  He may benefit from a supplemental injection in the near future.  Understands he needs to wait least 3 months between cortisone injections in the knee.  Questions encouraged and answered.  Follow-Up Instructions: Return if symptoms worsen or fail to improve.   Orders:  Orders Placed This Encounter  Procedures  . Large Joint Inj: R knee  . XR Knee 1-2 Views Right   No orders of the defined types were placed in this encounter.     Procedures: Large Joint Inj: R knee on 07/06/2019 4:26 PM Indications: pain Details: 22 G 1.5 in needle, superolateral approach  Arthrogram: No  Medications: 5 mL lidocaine 1 %; 40 mg methylPREDNISolone acetate 40 MG/ML Aspirate: 30 mL yellow Outcome: tolerated well, no immediate complications Procedure, treatment alternatives, risks and benefits explained, specific risks discussed. Consent was given by the patient. Immediately prior to procedure a time out was called to verify the correct patient, procedure, equipment, support staff and site/side marked as required. Patient was prepped and draped in the usual sterile fashion.       Clinical Data: No additional findings.   Subjective: Chief Complaint  Patient presents with  . Right Knee - Pain    HPI Patient is a 68 year old male were seen for the first time for right knee pain.  He states 1 month ago with his father and that he was spending more time working on cleaning his and developed knee pain.  States knee pain radiates down to his right  foot.  Has a history of right knee arthroscopy.  He is also had cortisone injections in both knees..  He describes no mechanical symptoms of the right knee.  Medical history is pertinent for morbid obesity, obstructive lung disease, sleep apnea, diabetes, tobacco abuse, congestive heart failure, iliac artery aneurysm and bilateral carotid artery stenosis.  He states that his diabetes is under good control with hemoglobin A1c around 6. Review of Systems Denies any fevers chills.  Objective: Vital Signs: There were no vitals taken for this visit.  Physical Exam Constitutional:      Appearance: He is not ill-appearing or diaphoretic.  Pulmonary:     Effort: Pulmonary effort is normal.  Neurological:     Mental Status: He is alert and oriented to person, place, and time.     Ortho Exam Bilateral knees good range of motion.  Patellofemoral crepitus bilaterally.  He has tenderness along medial joint line of the right knee.  No instability valgus varus stressing of either knee.  Slight effusion right knee only.  Brawny changes of both lower legs.  Right foot no impending ulcers rashes or skin lesions. Significant callus under the first metatarsal head region.  No abnormal warmth of either foot. Specialty Comments:  No specialty comments available.  Imaging: Xr Knee 1-2 Views Right  Result Date: 07/06/2019 Right knee varus deformity.  Medial joint line with near bone-on-bone changes.  Osteophytes of the lateral compartment.  Moderate patellofemoral changes.  No acute fractures.  Knee is well located    PMFS History: Patient Active Problem List   Diagnosis Date Noted  . Morbid obesity ([place]) 07/06/2019  . Congestive heart failure ([place]) 07/06/2019  . Obstructive lung disease ([place]) 07/06/2019  . Pulmonary embolism ([place]) 07/06/2019  . Tobacco abuse 07/06/2019  . Chronic pain of both hips 12/29/2018  . Chronic pain of both shoulders 12/29/2018  . Onychomycosis due to dermatophyte 09/28/2018   . Abduction deformity of foot, right 05/10/2018  . Contracture of both Achilles tendons 02/01/2018  . Other acquired hammer toe 02/01/2018  . Pre-ulcerative calluses 02/01/2018  . Chronic diarrhea 05/26/2017  . Obstructive sleep apnea (adult) (pediatric) 05/26/2017  . Aneurysm of iliac artery ([facility]) 08/12/2016  . Bilateral carotid artery stenosis 08/12/2016  . Venous stasis 08/12/2016  . Chronic pain of both knees 07/11/2016  . Mixed hyperlipidemia 07/11/2016  . Type 2 diabetes mellitus ([place]) 02/10/2012  . Essential hypertension 02/10/2012  . Shortness of breath 02/10/2012   History reviewed. No pertinent past medical history.  History reviewed. No pertinent family history.  History reviewed. No pertinent surgical history. Social History   Occupational History  . Not on file  Tobacco Use  . Smoking status: Not on file  Substance and Sexual Activity  . Alcohol use: Not on file  . Drug use: Not on file  . Sexual activity: Not on file

## 2019-12-21 ENCOUNTER — Telehealth: Payer: Self-pay | Admitting: Orthopaedic Surgery

## 2019-12-22 ENCOUNTER — Telehealth: Payer: Self-pay | Admitting: Orthopaedic Surgery

## 2019-12-22 NOTE — Telephone Encounter (Signed)
Can you do me a favor and schedule him sometime next week with Magnus Ivan

## 2019-12-22 NOTE — Telephone Encounter (Signed)
Patient's daughter called.   She is not satisfied with the next available being 6/7. Said the fluid build up in his knee needs to be checked on sooner.   Call back: [phone]

## 2019-12-29 ENCOUNTER — Telehealth: Payer: Self-pay | Admitting: Orthopaedic Surgery

## 2019-12-29 NOTE — Telephone Encounter (Signed)
Patient's daughter called. Would like for patient to have gel injections. Her call back number is [phone]

## 2019-12-29 NOTE — Telephone Encounter (Signed)
Right knee please

## 2019-12-30 NOTE — Telephone Encounter (Signed)
Noted  

## 2020-01-02 ENCOUNTER — Telehealth: Payer: Self-pay

## 2020-01-02 NOTE — Telephone Encounter (Signed)
Approved for Monovisc, right knee. [place] Once OOP has been met, patient is covered at 100%. Co-pay of $40.00 PA required PA Approval# [id number] Valid 01/02/2020- 07/03/2020  Appt. 01/04/2020

## 2020-01-02 NOTE — Telephone Encounter (Signed)
Submitted VOB for Monovisc, right knee. 

## 2020-01-04 ENCOUNTER — Other Ambulatory Visit: Payer: Self-pay

## 2020-01-04 ENCOUNTER — Ambulatory Visit (INDEPENDENT_AMBULATORY_CARE_PROVIDER_SITE_OTHER): Payer: Medicare PPO | Admitting: Physician Assistant

## 2020-01-04 ENCOUNTER — Encounter: Payer: Self-pay | Admitting: Physician Assistant

## 2020-01-04 DIAGNOSIS — M1711 Unilateral primary osteoarthritis, right knee: Secondary | ICD-10-CM

## 2020-01-04 MED ORDER — HYALURONAN 88 MG/4ML IX SOSY
88.0000 mg | PREFILLED_SYRINGE | INTRA_ARTICULAR | Status: AC | PRN
  Administered 2020-01-04: 88 mg via INTRA_ARTICULAR

## 2020-01-04 NOTE — Progress Notes (Signed)
   Procedure Note  Patient: Darren Hunt             Date of Birth: 1950/12/30           MRN: [id number]             Visit Date: 01/04/2020 HPI: Darren Hunt comes in today for right knee Monovisc injection.  He does state that the cortisone injection helped for some time.  He has had no new injury to the right knee.  He is having severe pain in the knee. He had significant arthritis of the right knee He also ask about his right foot which he has an ulcer on and has seen a podiatrist for.  Patient has diabetic.  Reports that he had cellulitis involving the right foot at home was recently on antibiotics for this.  Physical exam: Right knee positive effusion no abnormal warmth erythema.  Overall good range of motion Right foot plantar aspect of foot first metatarsal head region he has a grade 1 ulceration.  There is no expressible purulence.  Significant callus over this area.  Procedures: Visit Diagnoses:  1. Primary osteoarthritis of right knee     Large Joint Inj on 01/04/2020 1:23 PM Indications: pain Details: 22 G 1.5 in needle, anterolateral approach  Arthrogram: No  Medications: 88 mg Hyaluronan 88 MG/4ML Outcome: tolerated well, no immediate complications Procedure, treatment alternatives, risks and benefits explained, specific risks discussed. Consent was given by the patient. Immediately prior to procedure a time out was called to verify the correct patient, procedure, equipment, support staff and site/side marked as required. Patient was prepped and draped in the usual sterile fashion.     Plan: I have him follow-up with Dr. Lajoyce Corners for treatment of the right foot ulceration under the first metatarsal head.  In the interim I would like for him to soak the foot once daily in solution of Dial soap soaks for 15 to 20 minutes.  We will see him back in 6 weeks for follow-up of the right knee.  He did ask about evaluation left knee.  At next office visit would like AP and lateral views of  the left knee.  Questions were encouraged and

## 2020-01-09 ENCOUNTER — Ambulatory Visit (INDEPENDENT_AMBULATORY_CARE_PROVIDER_SITE_OTHER): Payer: Medicare PPO | Admitting: Orthopedic Surgery

## 2020-01-09 ENCOUNTER — Other Ambulatory Visit: Payer: Self-pay

## 2020-01-09 DIAGNOSIS — L97411 Non-pressure chronic ulcer of right heel and midfoot limited to breakdown of skin: Secondary | ICD-10-CM | POA: Diagnosis not present

## 2020-01-09 DIAGNOSIS — I872 Venous insufficiency (chronic) (peripheral): Secondary | ICD-10-CM | POA: Diagnosis not present

## 2020-01-09 DIAGNOSIS — M6701 Short Achilles tendon (acquired), right ankle: Secondary | ICD-10-CM

## 2020-01-09 DIAGNOSIS — E08621 Diabetes mellitus due to underlying condition with foot ulcer: Secondary | ICD-10-CM | POA: Diagnosis not present

## 2020-01-10 ENCOUNTER — Encounter: Payer: Self-pay | Admitting: Orthopedic Surgery

## 2020-01-10 NOTE — Progress Notes (Signed)
Office Visit Note   Patient: Darren Hunt           Date of Birth: [birth year]           MRN: [id number] Visit Date: 01/09/2020              Requested by: No referring provider defined for this encounter. PCP: Patient, No Pcp Per  Chief Complaint  Patient presents with   Right Foot - Pain      HPI: Patient is a 69 year old gentleman who is seen for initial evaluation for a diabetic insensate neuropathic ulcer beneath the right first metatarsal head.  Ulcer has been present for 6 months.  Patient denies any drainage she states it is painful.  Patient states he has been treated with antibiotics for cellulitis from a podiatrist.  Patient states his last hemoglobin A1c was 7.4.  Assessment & Plan: Visit Diagnoses:  1. Diabetic ulcer of right midfoot associated with diabetes mellitus due to underlying condition, limited to breakdown of skin (HCC)   2. Achilles tendon contracture, right   3. Venous stasis dermatitis of both lower extremities     Plan: Ulcer was debrided of skin and soft tissue.  Patient was given instructions and demonstrated Achilles stretching to do 5 times a day a minute at a time reevaluate in 4 weeks patient may require a gastrocnemius recession to prevent recurrent ulceration.  Follow-Up Instructions: Return in about 4 weeks (around 02/06/2020).   Ortho Exam  Patient is alert, oriented, no adenopathy, well-dressed, normal affect, normal respiratory effort. Examination patient has a palpable pulse he has brawny edema in both legs with venous insufficiency his calf is 49 cm in circumference patient has a Wagner grade 1 ulcer beneath the right first metatarsal head.  Ulcer is 10 mm in diameter prior to debridement.  After informed consent a 10 blade knife was used to debride the skin and soft tissue back to healthy viable granulation tissue after debridement the ulcer is 2 cm in diameter 3 mm deep.  Patient does have Achilles contracture with dorsiflexion 20  degrees short of neutral with the knee extended.  Imaging: No results found. No images are attached to the encounter.  Labs: No results found for: HGBA1C, ESRSEDRATE, CRP, LABURIC, REPTSTATUS, GRAMSTAIN, CULT, LABORGA   No results found for: ALBUMIN, PREALBUMIN, LABURIC  No results found for: MG No results found for: VD25OH  No results found for: PREALBUMIN No flowsheet data found.   There is no height or weight on file to calculate BMI.  Orders:  No orders of the defined types were placed in this encounter.  No orders of the defined types were placed in this encounter.    Procedures: No procedures performed  Clinical Data: No additional findings.  ROS:  All other systems negative, except as noted in the HPI. Review of Systems  Objective: Vital Signs: There were no vitals taken for this visit.  Specialty Comments:  No specialty comments available.  PMFS History: Patient Active Problem List   Diagnosis Date Noted   Morbid obesity (HCC) 07/06/2019   Congestive heart failure (HCC) 07/06/2019   Obstructive lung disease (HCC) 07/06/2019   Pulmonary embolism (HCC) 07/06/2019   Tobacco abuse 07/06/2019   Chronic pain of both hips 12/29/2018   Chronic pain of both shoulders 12/29/2018   Onychomycosis due to dermatophyte 09/28/2018   Abduction deformity of foot, right 05/10/2018   Contracture of both Achilles tendons 02/01/2018   Other acquired hammer toe 02/01/2018  Pre-ulcerative calluses 02/01/2018   Chronic diarrhea 05/26/2017   Obstructive sleep apnea (adult) (pediatric) 05/26/2017   Aneurysm of iliac artery (HCC) 08/12/2016   Bilateral carotid artery stenosis 08/12/2016   Venous stasis 08/12/2016   Chronic pain of both knees 07/11/2016   Mixed hyperlipidemia 07/11/2016   Type 2 diabetes mellitus ([place]) 02/10/2012   Essential hypertension 02/10/2012   Shortness of breath 02/10/2012   History reviewed. No pertinent past medical  history.  History reviewed. No pertinent family history.  History reviewed. No pertinent surgical history. Social History   Occupational History   Not on file  Tobacco Use   Smoking status: Not on file  Substance and Sexual Activity   Alcohol use: Not on file   Drug use: Not on file   Sexual activity: Not on file

## 2020-02-06 ENCOUNTER — Ambulatory Visit: Payer: Medicare PPO | Admitting: Orthopedic Surgery

## 2020-02-23 ENCOUNTER — Telehealth: Payer: Self-pay

## 2020-02-23 ENCOUNTER — Encounter: Payer: Self-pay | Admitting: Orthopaedic Surgery

## 2020-02-23 ENCOUNTER — Ambulatory Visit: Payer: Medicare PPO | Admitting: Orthopaedic Surgery

## 2020-02-23 DIAGNOSIS — M1711 Unilateral primary osteoarthritis, right knee: Secondary | ICD-10-CM | POA: Diagnosis not present

## 2020-02-23 NOTE — Telephone Encounter (Signed)
Can we get auth for left knee gel injection? 

## 2020-02-23 NOTE — Progress Notes (Signed)
Office Visit Note   Patient: Darren Hunt           Date of Birth: [birth year]           MRN: [id number] Visit Date: 02/23/2020              Requested by: No referring provider defined for this encounter. PCP: Patient, No Pcp Per   Assessment & Plan: Visit Diagnoses:  1. Primary osteoarthritis of right knee     Plan: I would not recommend any more steroid injections and for his knee since they have not really helped much and they do increase his blood glucose.  He has done well with his Monovisc injection for the right knee so I do feel it is appropriate to order this for his left knee to treat the pain from osteoarthritis given the failure of other conservative treatment measures.  He will continue to work on blood glucose control.  Hopefully we will see him back in the near future for a Monovisc injection in the left knee.  At that visit I would like him also to be weighed in a BMI calculated.  Follow-Up Instructions: No follow-ups on file.   Orders:  No orders of the defined types were placed in this encounter.  No orders of the defined types were placed in this encounter.     Procedures: No procedures performed   Clinical Data: No additional findings.   Subjective: Chief Complaint  Patient presents with  . Right Foot - Follow-up  . Right Leg - Follow-up  . Left Leg - Follow-up  Patient is a very pleasant individual with bilateral knee osteoarthritis.  We actually placed a Monovisc hyaluronic acid injection in the right knee in June of this year.  He is requesting something similar for his left knee.  He does have a history of a left tibial shaft fracture and there is an intramedullary nail in his left kidney.  X-rays previously of the left knee also shows tricompartment arthritic changes.  He does have venous stasis changes in both legs.  He is a diabetic and reports not great control.  Anytime he had steroid injections that is really bumped up his blood glucose.  He  reports bilateral knee stiffness.  HPI  Review of Systems He currently denies any headache, chest pain, shortness of breath, fever, chills, nausea, vomiting  Objective: Vital Signs: There were no vitals taken for this visit.  Physical Exam He is alert and orient x3 and in no acute distress Ortho Exam Examination of both knees shows varus malalignment with patellofemoral crepitation and significant medial joint line tenderness of both knees.  Both legs have significant venous stasis changes with no open wounds.  He does have neuropathy of both feet. Specialty Comments:  No specialty comments available.  Imaging: No results found. Previous x-rays of both knees show tricompartmental arthritis.  PMFS History: Patient Active Problem List   Diagnosis Date Noted  . Morbid obesity (HCC) 07/06/2019  . Congestive heart failure (HCC) 07/06/2019  . Obstructive lung disease (HCC) 07/06/2019  . Pulmonary embolism (HCC) 07/06/2019  . Tobacco abuse 07/06/2019  . Chronic pain of both hips 12/29/2018  . Chronic pain of both shoulders 12/29/2018  . Onychomycosis due to dermatophyte 09/28/2018  . Abduction deformity of foot, right 05/10/2018  . Contracture of both Achilles tendons 02/01/2018  . Other acquired hammer toe 02/01/2018  . Pre-ulcerative calluses 02/01/2018  . Chronic diarrhea 05/26/2017  . Obstructive sleep apnea (adult) (pediatric)  05/26/2017  . Aneurysm of iliac artery (HCC) 08/12/2016  . Bilateral carotid artery stenosis 08/12/2016  . Venous stasis 08/12/2016  . Chronic pain of both knees 07/11/2016  . Mixed hyperlipidemia 07/11/2016  . Type 2 diabetes mellitus (HCC) 02/10/2012  . Essential hypertension 02/10/2012  . Shortness of breath 02/10/2012   History reviewed. No pertinent past medical history.  History reviewed. No pertinent family history.  History reviewed. No pertinent surgical history. Social History   Occupational History  . Not on file  Tobacco Use  .  Smoking status: Not on file  Substance and Sexual Activity  . Alcohol use: Not on file  . Drug use: Not on file  . Sexual activity: Not on file

## 2020-02-24 NOTE — Telephone Encounter (Signed)
Noted  

## 2020-02-27 ENCOUNTER — Telehealth: Payer: Self-pay

## 2020-02-27 NOTE — Telephone Encounter (Signed)
Submitted for VOB for Monovisc-Left knee 

## 2020-02-28 ENCOUNTER — Telehealth: Payer: Self-pay

## 2020-02-28 NOTE — Telephone Encounter (Signed)
Approved for Monovisc-Left knee Dr. Kathrin Greathouse and Bill $40 copay No OOP [employer] auth # [id number] Dates: 07/31/19-07/30/20

## 2020-02-28 NOTE — Telephone Encounter (Signed)
Tried calling pt to schedule...line was busy

## 2020-03-01 NOTE — Telephone Encounter (Signed)
FYI  Unable to reach pt to schedule. Every time I call line is always busy

## 2020-06-20 ENCOUNTER — Telehealth: Payer: Self-pay | Admitting: Nurse Practitioner

## 2020-06-20 NOTE — Telephone Encounter (Signed)
Called to Discuss with patient about Covid symptoms and the use of the monoclonal antibody infusion for those with mild to moderate Covid symptoms and at a high risk of hospitalization.     Pt appears to qualify for this infusion due to co-morbid conditions and/or a member of an at-risk group in accordance with the FDA Emergency Use Authorization. (BMI >25, hypertension, and diabetes).   Unable to reach pt. Voicemail left.   Willette Alma, NP [facility]  [phone]

## 2020-06-30 ENCOUNTER — Telehealth: Payer: Self-pay | Admitting: Infectious Diseases

## 2020-06-30 ENCOUNTER — Ambulatory Visit (HOSPITAL_COMMUNITY)
Admission: RE | Admit: 2020-06-30 | Discharge: 2020-06-30 | Disposition: A | Discharge status: Home / Self Care | Payer: Medicare Other | Source: Ambulatory Visit | Attending: Pulmonary Disease | Admitting: Pulmonary Disease

## 2020-06-30 ENCOUNTER — Other Ambulatory Visit: Payer: Self-pay | Admitting: Infectious Diseases

## 2020-06-30 ENCOUNTER — Telehealth (HOSPITAL_COMMUNITY): Payer: Self-pay

## 2020-06-30 DIAGNOSIS — E119 Type 2 diabetes mellitus without complications: Secondary | ICD-10-CM | POA: Diagnosis not present

## 2020-06-30 DIAGNOSIS — I10 Essential (primary) hypertension: Secondary | ICD-10-CM | POA: Insufficient documentation

## 2020-06-30 DIAGNOSIS — Z6825 Body mass index (BMI) 25.0-25.9, adult: Secondary | ICD-10-CM | POA: Diagnosis not present

## 2020-06-30 DIAGNOSIS — U071 COVID-19: Secondary | ICD-10-CM | POA: Diagnosis present

## 2020-06-30 DIAGNOSIS — Z23 Encounter for immunization: Secondary | ICD-10-CM | POA: Insufficient documentation

## 2020-06-30 DIAGNOSIS — R54 Age-related physical debility: Secondary | ICD-10-CM | POA: Insufficient documentation

## 2020-06-30 MED ORDER — ONDANSETRON HCL 4 MG/2ML IJ SOLN
4.0000 mg | Freq: Once | INTRAMUSCULAR | Status: AC
  Administered 2020-06-30: 4 mg via INTRAVENOUS
  Filled 2020-06-30: qty 2

## 2020-06-30 MED ORDER — SODIUM CHLORIDE 0.9 % IV SOLN: INTRAVENOUS | Status: DC | PRN

## 2020-06-30 MED ORDER — DIPHENHYDRAMINE HCL 50 MG/ML IJ SOLN: 50.0000 mg | Freq: Once | INTRAMUSCULAR | Status: DC | PRN

## 2020-06-30 MED ORDER — METHYLPREDNISOLONE SODIUM SUCC 125 MG IJ SOLR: 125.0000 mg | Freq: Once | INTRAMUSCULAR | Status: DC | PRN

## 2020-06-30 MED ORDER — FAMOTIDINE IN NACL 20-0.9 MG/50ML-% IV SOLN: 20.0000 mg | Freq: Once | INTRAVENOUS | Status: DC | PRN

## 2020-06-30 MED ORDER — EPINEPHRINE 0.3 MG/0.3ML IJ SOAJ: 0.3000 mg | Freq: Once | INTRAMUSCULAR | Status: DC | PRN

## 2020-06-30 MED ORDER — ALBUTEROL SULFATE HFA 108 (90 BASE) MCG/ACT IN AERS: 2.0000 | INHALATION_SPRAY | Freq: Once | RESPIRATORY_TRACT | Status: DC | PRN

## 2020-06-30 MED ORDER — SODIUM CHLORIDE 0.9 % IV SOLN: Freq: Once | INTRAVENOUS | Status: AC

## 2020-06-30 MED ORDER — ONDANSETRON 4 MG PO TBDP: 4.0000 mg | ORAL_TABLET | Freq: Three times a day (TID) | ORAL | 0 refills | Status: DC | PRN

## 2020-06-30 NOTE — Telephone Encounter (Signed)
Called to Discuss with patient about Covid symptoms and the use of the monoclonal antibody infusion for those with mild to moderate Covid symptoms and at a high risk of hospitalization.     Pt appears to qualify for this infusion due to co-morbid conditions and/or a member of an at-risk group in accordance with the FDA Emergency Use Authorization.    Asked to review patient for candidacy while a family member is coming in for treatment also.   Multiple qualifying risk factors medically. Symptoms started about 1 week ago.  He is having significant nausea and difficulty holding things down. Will give Rx for ODT zofran also    [name], MSN, NP-C [facility]ious [facility]  [email or web address] Pager: [phone] Office: [phone] [facility] Main Line: [phone]

## 2020-06-30 NOTE — Telephone Encounter (Signed)
Called to discuss with patient about Covid symptoms and the use of the monoclonal antibody infusion for those with mild to moderate Covid symptoms and at a high risk of hospitalization.     Pt appears to qualify for this infusion due to co-morbid conditions and/or a member of an at-risk group in accordance with the FDA Emergency Use Authorization.    Risk factors include: Age over 32, DM , HTN, sleep apnea, COPD   Symptom onset: fatigue, diarrhea, fever, chills, vomiting, stomach pain, no weakness.    Tested positive for COVID 19: 12/2, copy given to [place] by his wife on her apt on 12/4.    Discussed information regarding costs of monoclonal antibody treatment, given both CPT & REV codes, and encouraged patient to call their health insurance company to verify cost of treatment that pt will be financially responsible for.  Patient aware they will receive a call from APP for further information and to schedule appointment. All questions answered.   Gave patient information the address to clinic, [address] and phone number to call once patient arrives, [phone].

## 2020-06-30 NOTE — Discharge Instructions (Signed)
10 Things You Can Do to Manage Your COVID-19 Symptoms at Home If you have possible or confirmed COVID-19: 1. Stay home from work and school. And stay away from other public places. If you must go out, avoid using any kind of public transportation, ridesharing, or taxis. 2. Monitor your symptoms carefully. If your symptoms get worse, call your healthcare provider immediately. 3. Get rest and stay hydrated. 4. If you have a medical appointment, call the healthcare provider ahead of time and tell them that you have or may have COVID-19. 5. For medical emergencies, call 911 and notify the dispatch personnel that you have or may have COVID-19. 6. Cover your cough and sneezes with a tissue or use the inside of your elbow. 7. Wash your hands often with soap and water for at least 20 seconds or clean your hands with an alcohol-based hand sanitizer that contains at least 60% alcohol. 8. As much as possible, stay in a specific room and away from other people in your home. Also, you should use a separate bathroom, if available. If you need to be around other people in or outside of the home, wear a mask. 9. Avoid sharing personal items with other people in your household, like dishes, towels, and bedding. 10. Clean all surfaces that are touched often, like counters, tabletops, and doorknobs. Use household cleaning sprays or wipes according to the label instructions.  What types of side effects do monoclonal antibody drugs cause?  Common side effects  In general, the more common side effects caused by monoclonal antibody drugs include: . Allergic reactions, such as hives or itching . Flu-like signs and symptoms, including chills, fatigue, fever, and muscle aches and pains . Nausea, vomiting . Diarrhea . Skin rashes . Low blood pressure   The CDC is recommending patients who receive monoclonal antibody treatments wait at least 90 days before being vaccinated.  Currently, there are no data on the safety  and efficacy of mRNA COVID-19 vaccines in persons who received monoclonal antibodies or convalescent plasma as part of COVID-19 treatment. Based on the estimated half-life of such therapies as well as evidence suggesting that reinfection is uncommon in the 90 days after initial infection, vaccination should be deferred for at least 90 days, as a precautionary measure until additional information becomes available, to avoid interference of the antibody treatment with vaccine-induced immune responses.  If you have any questions or concerns after the infusion please call the Advanced Practice Provider on call at [phone]. This number is ONLY intended for your use regarding questions or concerns about the infusion post-treatment side-effects.  Please do not provide this number to others for use. For return to work notes please contact your primary care provider.   If someone you know is interested in receiving treatment please have them call the COVID hotline at [phone].   Marland Kitchenm [email or web address] 01/26/2019 This information is not intended to replace advice given to you by your health care provider. Make sure you discuss any questions you have with your health care provider. Document Revised: 06/30/2019 Document Reviewed: 06/30/2019 Elsevier Patient Education  2020 [employer].

## 2020-06-30 NOTE — Progress Notes (Signed)
I connected by phone with Darren Hunt on 06/30/2020 at 3:50 PM to discuss the potential use of a new treatment for mild to moderate COVID-19 viral infection in non-hospitalized patients.  This patient is a 69 y.o. male that meets the FDA criteria for Emergency Use Authorization of COVID monoclonal antibody casirivimab/imdevimab, bamlanivimab/eteseviamb, or sotrovimab.  Has a (+) direct SARS-CoV-2 viral test result  Has mild or moderate COVID-19   Is NOT hospitalized due to COVID-19  Is within 10 days of symptom onset  Has at least one of the high risk factor(s) for progression to severe COVID-19 and/or hospitalization as defined in EUA.  Specific high risk criteria : Older age (>/= 68 yo), Diabetes and Cardiovascular disease or hypertension   I have spoken and communicated the following to the patient or parent/caregiver regarding COVID monoclonal antibody treatment:  1. FDA has authorized the emergency use for the treatment of mild to moderate COVID-19 in adults and pediatric patients with positive results of direct SARS-CoV-2 viral testing who are 53 years of age and older weighing at least 40 kg, and who are at high risk for progressing to severe COVID-19 and/or hospitalization.  2. The significant known and potential risks and benefits of COVID monoclonal antibody, and the extent to which such potential risks and benefits are unknown.  3. Information on available alternative treatments and the risks and benefits of those alternatives, including clinical trials.  4. Patients treated with COVID monoclonal antibody should continue to self-isolate and use infection control measures (e.g., wear mask, isolate, social distance, avoid sharing personal items, clean and disinfect "high touch" surfaces, and frequent handwashing) according to CDC guidelines.   5. The patient or parent/caregiver has the option to accept or refuse COVID monoclonal antibody treatment.  After reviewing this  information with the patient, the patient has agreed to receive one of the available covid 19 monoclonal antibodies and will be provided an appropriate fact sheet prior to infusion. [name], NP 06/30/2020 3:50 PM

## 2020-06-30 NOTE — Progress Notes (Addendum)
Patient reviewed Fact Sheet for Patients, Parents, and Caregivers for Emergency Use Authorization (EUA) of REGEN-COV (casirivimab and imdevimab)  for the Treatment of Coronavirus. Patient also reviewed and is agreeable to the estimated cost of treatment. Patient is agreeable to proceed.    

## 2020-06-30 NOTE — Progress Notes (Signed)
  Diagnosis: COVID-19  Physician: Dr Delford Field  Procedure: [facility] Infusion [facility] Med: remdesivir infusion - Provided patient with remdesivir fact sheet for patients, parents and caregivers prior to infusion.  Complications: No immediate complications noted.  Discharge: Discharged home   Darren Hunt 06/30/2020

## 2021-08-05 DIAGNOSIS — I872 Venous insufficiency (chronic) (peripheral): Secondary | ICD-10-CM | POA: Insufficient documentation

## 2021-08-05 DIAGNOSIS — L03115 Cellulitis of right lower limb: Secondary | ICD-10-CM

## 2021-08-05 HISTORY — DX: Cellulitis of right lower limb: L03.115

## 2021-08-05 HISTORY — DX: Venous insufficiency (chronic) (peripheral): I87.2

## 2021-08-27 DIAGNOSIS — M2550 Pain in unspecified joint: Secondary | ICD-10-CM

## 2021-08-27 DIAGNOSIS — L97519 Non-pressure chronic ulcer of other part of right foot with unspecified severity: Secondary | ICD-10-CM

## 2021-08-27 DIAGNOSIS — J449 Chronic obstructive pulmonary disease, unspecified: Secondary | ICD-10-CM

## 2021-08-27 DIAGNOSIS — I77810 Thoracic aortic ectasia: Secondary | ICD-10-CM

## 2021-08-27 DIAGNOSIS — Z86711 Personal history of pulmonary embolism: Secondary | ICD-10-CM | POA: Insufficient documentation

## 2021-08-27 DIAGNOSIS — E113591 Type 2 diabetes mellitus with proliferative diabetic retinopathy without macular edema, right eye: Secondary | ICD-10-CM

## 2021-08-27 HISTORY — DX: Personal history of pulmonary embolism: Z86.711

## 2021-08-27 HISTORY — DX: Non-pressure chronic ulcer of other part of right foot with unspecified severity: L97.519

## 2021-08-27 HISTORY — DX: Pain in unspecified joint: M25.50

## 2021-08-27 HISTORY — DX: Chronic obstructive pulmonary disease, unspecified: J44.9

## 2021-08-27 HISTORY — DX: Type 2 diabetes mellitus with proliferative diabetic retinopathy without macular edema, right eye: E11.3591

## 2021-08-27 HISTORY — DX: Thoracic aortic ectasia: I77.810

## 2021-12-03 DIAGNOSIS — E11621 Type 2 diabetes mellitus with foot ulcer: Secondary | ICD-10-CM

## 2021-12-03 DIAGNOSIS — L97409 Non-pressure chronic ulcer of unspecified heel and midfoot with unspecified severity: Secondary | ICD-10-CM | POA: Insufficient documentation

## 2021-12-03 DIAGNOSIS — Z9189 Other specified personal risk factors, not elsewhere classified: Secondary | ICD-10-CM

## 2021-12-03 DIAGNOSIS — I700 Atherosclerosis of aorta: Secondary | ICD-10-CM

## 2021-12-03 HISTORY — DX: Other specified personal risk factors, not elsewhere classified: Z91.89

## 2021-12-03 HISTORY — DX: Atherosclerosis of aorta: I70.0

## 2021-12-03 HISTORY — DX: Type 2 diabetes mellitus with foot ulcer: E11.621

## 2021-12-24 DIAGNOSIS — M1711 Unilateral primary osteoarthritis, right knee: Secondary | ICD-10-CM

## 2021-12-24 HISTORY — DX: Unilateral primary osteoarthritis, right knee: M17.11

## 2021-12-31 DIAGNOSIS — Z789 Other specified health status: Secondary | ICD-10-CM

## 2021-12-31 HISTORY — DX: Other specified health status: Z78.9

## 2022-01-29 DIAGNOSIS — Z9189 Other specified personal risk factors, not elsewhere classified: Secondary | ICD-10-CM

## 2022-01-29 HISTORY — DX: Other specified personal risk factors, not elsewhere classified: Z91.89

## 2022-02-06 DIAGNOSIS — L0291 Cutaneous abscess, unspecified: Secondary | ICD-10-CM

## 2022-02-06 DIAGNOSIS — B372 Candidiasis of skin and nail: Secondary | ICD-10-CM | POA: Insufficient documentation

## 2022-02-06 HISTORY — DX: Candidiasis of skin and nail: B37.2

## 2022-02-06 HISTORY — DX: Cutaneous abscess, unspecified: L02.91

## 2022-07-25 DIAGNOSIS — E1129 Type 2 diabetes mellitus with other diabetic kidney complication: Secondary | ICD-10-CM

## 2022-07-25 DIAGNOSIS — R809 Proteinuria, unspecified: Secondary | ICD-10-CM | POA: Insufficient documentation

## 2022-07-25 HISTORY — DX: Proteinuria, unspecified: E11.29

## 2023-05-27 ENCOUNTER — Other Ambulatory Visit (INDEPENDENT_AMBULATORY_CARE_PROVIDER_SITE_OTHER): Payer: Medicare PPO

## 2023-05-27 ENCOUNTER — Other Ambulatory Visit (INDEPENDENT_AMBULATORY_CARE_PROVIDER_SITE_OTHER): Payer: Self-pay

## 2023-05-27 ENCOUNTER — Encounter: Payer: Self-pay | Admitting: Orthopaedic Surgery

## 2023-05-27 ENCOUNTER — Ambulatory Visit (INDEPENDENT_AMBULATORY_CARE_PROVIDER_SITE_OTHER): Payer: Medicare PPO | Admitting: Orthopaedic Surgery

## 2023-05-27 VITALS — Ht 72.5 in | Wt 327.0 lb

## 2023-05-27 DIAGNOSIS — G8929 Other chronic pain: Secondary | ICD-10-CM

## 2023-05-27 DIAGNOSIS — M25561 Pain in right knee: Secondary | ICD-10-CM | POA: Diagnosis not present

## 2023-05-27 DIAGNOSIS — M25562 Pain in left knee: Secondary | ICD-10-CM

## 2023-05-27 MED ORDER — METHYLPREDNISOLONE ACETATE 40 MG/ML IJ SUSP
40.0000 mg | INTRAMUSCULAR | Status: AC | PRN
  Administered 2023-05-27: 40 mg via INTRA_ARTICULAR

## 2023-05-27 MED ORDER — LIDOCAINE HCL 1 % IJ SOLN
3.0000 mL | INTRAMUSCULAR | Status: AC | PRN
  Administered 2023-05-27: 3 mL

## 2023-05-27 NOTE — Progress Notes (Signed)
The patient is someone we have seen in the past.  He has debilitating arthritis in both of his knees and is requesting a steroid injection in both knees today.  He is a diabetic but reports that hemoglobin A1c of 6.3.  We did x-ray both knees today.  Both knees shows severe end-stage arthritis with bone-on-bone wear of the medial compartment, varus malalignment and osteophytes in all 3 compartments.  There is a retained tibial nail from an accident many years ago in the left tibia.  He is morbidly obese with a BMI of 43.74.  He is on insulin and is able to regulate his blood glucose levels.  Both knees were examined and have varus malalignment.  Both knees have patellofemoral crepitation and medial as well as patellofemoral joint pain.  There is venous stasis changes in both legs.  I did place a steroid injection in both knees today which she tolerated well.  He knows he can have these again in 3 to 4 months if needed.  He will watch his blood glucose closely.  He still needs to work on weight loss before proceeding with any type of knee replacement surgery and he understands this as well.     Procedure Note  Patient: Darren Hunt             Date of Birth: [birth year]           MRN: [id number]             Visit Date: 05/27/2023  Procedures: Visit Diagnoses:  1. Chronic pain of right knee   2. Chronic pain of left knee   3. Severe obesity (BMI >= 40) (HCC)     Large Joint Inj: R knee on 05/27/2023 2:02 PM Indications: diagnostic evaluation and pain Details: 22 G 1.5 in needle, superolateral approach  Arthrogram: No  Medications: 3 mL lidocaine 1 %; 40 mg methylPREDNISolone acetate 40 MG/ML Outcome: tolerated well, no immediate complications Procedure, treatment alternatives, risks and benefits explained, specific risks discussed. Consent was given by the patient. Immediately prior to procedure a time out was called to verify the correct patient, procedure, equipment, support staff and  site/side marked as required. Patient was prepped and draped in the usual sterile fashion.    Large Joint Inj: L knee on 05/27/2023 2:02 PM Indications: diagnostic evaluation and pain Details: 22 G 1.5 in needle, superolateral approach  Arthrogram: No  Medications: 3 mL lidocaine 1 %; 40 mg methylPREDNISolone acetate 40 MG/ML Outcome: tolerated well, no immediate complications Procedure, treatment alternatives, risks and benefits explained, specific risks discussed. Consent was given by the patient. Immediately prior to procedure a time out was called to verify the correct patient, procedure, equipment, support staff and site/side marked as required. Patient was prepped and draped in the usual sterile fashion.

## 2023-08-10 DIAGNOSIS — F1120 Opioid dependence, uncomplicated: Secondary | ICD-10-CM | POA: Insufficient documentation

## 2023-08-10 HISTORY — DX: Opioid dependence, uncomplicated: F11.20

## 2023-08-11 DIAGNOSIS — J9611 Chronic respiratory failure with hypoxia: Secondary | ICD-10-CM | POA: Insufficient documentation

## 2023-08-11 HISTORY — DX: Chronic respiratory failure with hypoxia: J96.11

## 2023-09-03 DIAGNOSIS — R011 Cardiac murmur, unspecified: Secondary | ICD-10-CM | POA: Insufficient documentation

## 2023-09-03 HISTORY — DX: Cardiac murmur, unspecified: R01.1

## 2023-10-14 DIAGNOSIS — I350 Nonrheumatic aortic (valve) stenosis: Secondary | ICD-10-CM | POA: Insufficient documentation

## 2023-10-14 HISTORY — DX: Nonrheumatic aortic (valve) stenosis: I35.0

## 2023-12-03 ENCOUNTER — Ambulatory Visit

## 2024-01-11 ENCOUNTER — Ambulatory Visit

## 2024-01-11 ENCOUNTER — Telehealth: Payer: Self-pay | Admitting: Emergency Medicine

## 2024-01-11 VITALS — BP 124/66 | HR 65 | Ht 72.5 in | Wt 331.0 lb

## 2024-01-11 DIAGNOSIS — I77810 Thoracic aortic ectasia: Secondary | ICD-10-CM

## 2024-01-11 DIAGNOSIS — I10 Essential (primary) hypertension: Secondary | ICD-10-CM

## 2024-01-11 DIAGNOSIS — I350 Nonrheumatic aortic (valve) stenosis: Secondary | ICD-10-CM

## 2024-01-11 DIAGNOSIS — I6523 Occlusion and stenosis of bilateral carotid arteries: Secondary | ICD-10-CM

## 2024-01-11 DIAGNOSIS — I2510 Atherosclerotic heart disease of native coronary artery without angina pectoris: Secondary | ICD-10-CM

## 2024-01-11 DIAGNOSIS — I723 Aneurysm of iliac artery: Secondary | ICD-10-CM | POA: Diagnosis not present

## 2024-01-11 DIAGNOSIS — F17200 Nicotine dependence, unspecified, uncomplicated: Secondary | ICD-10-CM | POA: Insufficient documentation

## 2024-01-11 DIAGNOSIS — E782 Mixed hyperlipidemia: Secondary | ICD-10-CM

## 2024-01-11 HISTORY — DX: Atherosclerotic heart disease of native coronary artery without angina pectoris: I25.10

## 2024-01-11 HISTORY — DX: Nicotine dependence, unspecified, uncomplicated: F17.200

## 2024-01-11 MED ORDER — FUROSEMIDE 20 MG PO TABS: 20.0000 mg | ORAL_TABLET | Freq: Every day | ORAL | 3 refills | Status: DC | PRN

## 2024-01-11 MED ORDER — EZETIMIBE 10 MG PO TABS: 10.0000 mg | ORAL_TABLET | Freq: Every day | ORAL | 3 refills | Status: DC

## 2024-01-11 NOTE — Patient Instructions (Signed)
 Medication Instructions:  Ezetimibe (Zetia) 10 mg every morning Furosemide (Lasix) 20 mg Daily IF NEEDED- for weight gain of 2-3 pounds overnight or 5 pounds in one week.   *If you need a refill on your cardiac medications before your next appointment, please call your pharmacy*  Testing/Procedures: Your physician has requested that you have an echocardiogram. Echocardiography is a painless test that uses sound waves to create images of your heart. It provides your doctor with information about the size and shape of your heart and how well your heart's chambers and valves are working. This procedure takes approximately one hour. There are no restrictions for this procedure. Please do NOT wear cologne, perfume, aftershave, or lotions (deodorant is allowed). Please arrive 15 minutes prior to your appointment time.  Please note: We ask at that you not bring children with you during ultrasound (echo/ vascular) testing. Due to room size and safety concerns, children are not allowed in the ultrasound rooms during exams. Our front office staff cannot provide observation of children in our lobby area while testing is being conducted. An adult accompanying a patient to their appointment will only be allowed in the ultrasound room at the discretion of the ultrasound technician under special circumstances. We apologize for any inconvenience.   Follow-Up: At [facility], you and your health needs are our priority.  As part of our continuing mission to provide you with exceptional heart care, our providers are all part of one team.  This team includes your primary Cardiologist (physician) and Advanced Practice Providers or APPs (Physician Assistants and Nurse Practitioners) who all work together to provide you with the care you need, when you need it.  Your next appointment:   2 month(s)  Provider:   Bertha Broad, MD    We recommend signing up for the patient portal called MyChart.  Sign up  information is provided on this After Visit Summary.  MyChart is used to connect with patients for Virtual Visits (Telemedicine).  Patients are able to view lab/test results, encounter notes, upcoming appointments, etc.  Non-urgent messages can be sent to your provider as well.   To learn more about what you can do with MyChart, go to [email or web address].       Low-Sodium Eating Plan Salt (sodium) helps you keep a healthy balance of fluids in your body. Too much sodium can raise your blood pressure. It can also cause fluid and waste to be held in your body. Your health care provider or dietitian may recommend a low-sodium eating plan if you have high blood pressure (hypertension), kidney disease, liver disease, or heart failure. Eating less sodium can help lower your blood pressure and reduce swelling. It can also protect your heart, liver, and kidneys. What are tips for following this plan? Reading food labels  Check food labels for the amount of sodium per serving. If you eat more than one serving, you must multiply the listed amount by the number of servings. Choose foods with less than 140 milligrams (mg) of sodium per serving. Avoid foods with 300 mg of sodium or more per serving. Always check how much sodium is in a product, even if the label says unsalted or no salt added. Shopping  Buy products labeled as low-sodium or no salt added. Buy fresh foods. Avoid canned foods and pre-made or frozen meals. Avoid canned, cured, or processed meats. Buy breads that have less than 80 mg of sodium per slice. Cooking  Eat more home-cooked food. Try to eat less  restaurant, buffet, and fast food. Try not to add salt when you cook. Use salt-free seasonings or herbs instead of table salt or sea salt. Check with your provider or pharmacist before using salt substitutes. Cook with plant-based oils, such as canola, sunflower, or olive oil. Meal planning When eating at a restaurant, ask if  your food can be made with less salt or no salt. Avoid dishes labeled as brined, pickled, cured, or smoked. Avoid dishes made with soy sauce, miso, or teriyaki sauce. Avoid foods that have monosodium glutamate (MSG) in them. MSG may be added to some restaurant food, sauces, soups, bouillon, and canned foods. Make meals that can be grilled, baked, poached, roasted, or steamed. These are often made with less sodium. General information Try to limit your sodium intake to 1,500-2,300 mg each day, or the amount told by your provider. What foods should I eat? Fruits Fresh, frozen, or canned fruit. Fruit juice. Vegetables Fresh or frozen vegetables. No salt added canned vegetables. No salt added tomato sauce and paste. Low-sodium or reduced-sodium tomato and vegetable juice. Grains Low-sodium cereals, such as oats, puffed wheat and rice, and shredded wheat. Low-sodium crackers. Unsalted rice. Unsalted pasta. Low-sodium bread. Whole grain breads and whole grain pasta. Meats and other proteins Fresh or frozen meat, poultry, seafood, and fish. These should have no added salt. Low-sodium canned tuna and salmon. Unsalted nuts. Dried peas, beans, and lentils without added salt. Unsalted canned beans. Eggs. Unsalted nut butters. Dairy Milk. Soy milk. Cheese that is naturally low in sodium, such as ricotta cheese, fresh mozzarella, or Swiss cheese. Low-sodium or reduced-sodium cheese. Cream cheese. Yogurt. Seasonings and condiments Fresh and dried herbs and spices. Salt-free seasonings. Low-sodium mustard and ketchup. Sodium-free salad dressing. Sodium-free light mayonnaise. Fresh or refrigerated horseradish. Lemon juice. Vinegar. Other foods Homemade, reduced-sodium, or low-sodium soups. Unsalted popcorn and pretzels. Low-salt or salt-free chips. The items listed above may not be all the foods and drinks you can have. Talk to a dietitian to learn more. What foods should I avoid? Vegetables Sauerkraut,  pickled vegetables, and relishes. Olives. [place] fries. Onion rings. Regular canned vegetables, except low-sodium or reduced-sodium items. Regular canned tomato sauce and paste. Regular tomato and vegetable juice. Frozen vegetables in sauces. Grains Instant hot cereals. Bread stuffing, pancake, and biscuit mixes. Croutons. Seasoned rice or pasta mixes. Noodle soup cups. Boxed or frozen macaroni and cheese. Regular salted crackers. Self-rising flour. Meats and other proteins Meat or fish that is salted, canned, smoked, spiced, or pickled. Precooked or cured meat, such as sausages or meat loaves. Helene Loader. Ham. Pepperoni. Hot dogs. Corned beef. Chipped beef. Salt pork. Jerky. Pickled herring, anchovies, and sardines. Regular canned tuna. Salted nuts. Dairy Processed cheese and cheese spreads. Hard cheeses. Cheese curds. Blue cheese. Feta cheese. String cheese. Regular cottage cheese. Buttermilk. Canned milk. Fats and oils Salted butter. Regular margarine. Ghee. Bacon fat. Seasonings and condiments Onion salt, garlic salt, seasoned salt, table salt, and sea salt. Canned and packaged gravies. Worcestershire sauce. Tartar sauce. Barbecue sauce. Teriyaki sauce. Soy sauce, including reduced-sodium soy sauce. Steak sauce. Fish sauce. Oyster sauce. Cocktail sauce. Horseradish that you find on the shelf. Regular ketchup and mustard. Meat flavorings and tenderizers. Bouillon cubes. Hot sauce. Pre-made or packaged marinades. Pre-made or packaged taco seasonings. Relishes. Regular salad dressings. Salsa. Other foods Salted popcorn and pretzels. Corn chips and puffs. Potato and tortilla chips. Canned or dried soups. Pizza. Frozen entrees and pot pies. The items listed above may not be all the foods  and drinks you should avoid. Talk to a dietitian to learn more. This information is not intended to replace advice given to you by your health care provider. Make sure you discuss any questions you have with your health care  provider. Document Revised: 07/31/2022 Document Reviewed: 07/31/2022 Elsevier Patient Education  2024 [employer].

## 2024-01-11 NOTE — Assessment & Plan Note (Signed)
 Limited functional status with underlying COPD and morbid obesity. 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 Right heart cath at the time with cardiac index 2.7, PCWP 17 and mean PA pressure 24 mmHg.  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  Unable to tolerate statins, will start Zetia and further escalate to PCSK9 inhibitors if lipid levels are not at goal on follow-up visits.

## 2024-01-11 NOTE — Progress Notes (Addendum)
 Cardiology Consultation:    Date:  01/11/2024   ID:  Darren Hunt, DOB 1950/10/25, MRN [id number]  PCP:  Patient, No Pcp Per  Cardiologist:  Alean JONELLE Kobus, MD   Referring MD: Stephen Rojelio PARAS, NP   No chief complaint on file.    ASSESSMENT AND PLAN:   Darren Hunt  73 year old male with history of carotid artery disease s/p left carotid stent, aortoiliac disease with left iliac aneurysm, diabetes mellitus, hypertension, hyperlipidemia, obstructive sleep apnea, smokes tobacco [2 packs/day], history of foreign years gangrene in 2019, COPD on nocturnal oxygen 3 L per nasal cannula flow, aortic atherosclerosis and aortic root dilatation on prior imaging, coronary artery disease with CTO of RCA with collaterals from left to right on cardiac cath August 2013 at [facility] with mean PA pressure at the time 24 mmHg with PCWP 17 and cardiac index 2.7 and PVR 1 Woods units.  Recently identified with aortic stenosis based on echocardiogram through PCPs office at [facility] per patient, results of this not available for my review currently. Also reports history of DVT/PE many years ago at the time of fracture of lower extremity requiring surgery and was on warfarin for few months.  Now remains on high-dose aspirin 325 mg he takes 1 to 2 pills a day. Also reports statin intolerance.  Here for further evaluation in the setting of aortic dilatation and aortic stenosis.   Problem List Items Addressed This Visit     Aneurysm of iliac artery (HCC)   Previous history per imaging, will refer to vascular surgery to establish care and follow-up.       Relevant Medications   ezetimibe  (ZETIA ) 10 MG tablet   furosemide  (LASIX ) 20 MG tablet   Other Relevant Orders   Ambulatory referral to Vascular Surgery   Bilateral carotid artery stenosis   History of bilateral carotid artery stenosis s/p left carotid artery stent done through [facility] vascular team Dr. Sheela. I do not have any  significant information available for follow-up since then. He mentions they have moved to a different fast area and he would like to establish care with [facility] health vascular team. Will place a referral.       Relevant Medications   ezetimibe  (ZETIA ) 10 MG tablet   furosemide  (LASIX ) 20 MG tablet   Essential hypertension - Primary   Well-controlled.  Continue current medications hydrochlorothiazide 25 mg once daily and losartan 100 mg once daily. Target below 130/80 mmHg.      Relevant Medications   ezetimibe  (ZETIA ) 10 MG tablet   furosemide  (LASIX ) 20 MG tablet   Other Relevant Orders   EKG 12-Lead (Completed)   Mixed hyperlipidemia   Associated with statin intolerance. Last lipid panel February 2025 with HDL 46 LDL 85, triglycerides 133 and total cholesterol 154. Target LDL below 70 mg/dL.  Reportedly tried various statins and did not tolerate due to muscle aches. Never tried Zetia . Start Zetia  10 mg once daily. If LDL not at goal below 70 mg/dL will proceed with PCSK9 inhibitors.       Relevant Medications   ezetimibe  (ZETIA ) 10 MG tablet   furosemide  (LASIX ) 20 MG tablet   Aortic root dilatation (HCC)   Information per PCPs office referral notes. I do not have any information with regards to the imaging.  Will obtain prior imaging test results from PCPs office and will obtain transthoracic echocardiogram through our office scheduled.       Relevant Medications   ezetimibe  (ZETIA )  10 MG tablet   furosemide  (LASIX ) 20 MG tablet   Other Relevant Orders   Ambulatory referral to Vascular Surgery   Aortic valve stenosis   Physical exam does have harsh systolic murmur consistent with aortic stenosis.  Will review echocardiogram results from outside facility once available.  I would repeat a transthoracic echocardiogram for comprehensive assessment and image review myself.  Briefly discussed with him and his wife today about potential signs and symptoms of severe aortic  stenosis such as chest pain, shortness of breath, lightheadedness, syncopal episodes.  Also briefly discussed about potential interventions if the valve is severely stenotic, such as surgical aortic valve replacement versus TAVI.  Given his morbid obesity and pulmonary issues would be a poor candidate for surgical aortic valve replacement and likely option would be TAVI if he does have severe aortic stenosis.        Relevant Medications   ezetimibe  (ZETIA ) 10 MG tablet   furosemide  (LASIX ) 20 MG tablet   Other Relevant Orders   ECHOCARDIOGRAM COMPLETE   CAD (coronary artery disease)   Limited functional status with underlying COPD and morbid obesity. Last cath at [facility] from August 2013 reportedly CTO RCA with distal left to right collaterals. Right heart cath at the time with cardiac index 2.7, PCWP 17 and mean PA pressure 24 mmHg.  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  Unable to tolerate statins, will start Zetia  and further escalate to PCSK9 inhibitors if lipid levels are not at goal on follow-up visits.       Relevant Medications   ezetimibe  (ZETIA ) 10 MG tablet   furosemide  (LASIX ) 20 MG tablet   Return to clinic tentatively in 2 months.  Addendum: Echocardiogram from 10/12/2023 at outside health facility in [facility] to PCPs office reported LVEF 55 to 60%, mild concentric LVH, grade 1 diastolic dysfunction, moderate aortic stenosis V-max 3.7 m/s, mean gradient 31 mmHg and mild to moderate aortic insufficiency  History of Present Illness:    Darren Hunt is a 73 y.o. male who is being seen today for the evaluation of aortic stenosis and establishing care at the request of Stephen Rojelio PARAS, NP. Last visit with cardiologist was at [facility] 04/22/2021 with Dr. Sheela  Has history of carotid artery disease s/p left carotid stent, aortoiliac disease with left iliac  aneurysm, diabetes mellitus, hypertension, hyperlipidemia, obstructive sleep apnea, smokes tobacco [2 packs/day], history of foreign years gangrene in 2019, COPD on nocturnal oxygen 3 L per nasal cannula flow, aortic atherosclerosis and aortic root dilatation on prior imaging, coronary artery disease with CTO of RCA with collaterals from left to right on cardiac cath August 2013 at [facility] with mean PA pressure at the time 24 mmHg with PCWP 17 and cardiac index 2.7 and PVR 1 Woods units.  Recently identified with aortic stenosis based on echocardiogram through PCPs office at [facility] per patient, results of this not available for my review currently  Pleasant man here for the visit today accompanied by his wife.  Mentions has had longstanding history of mobility due to COPD and obesity issues.  Continues to walk up and down basement stairs without any significant limitation, uses a ride on lawnmower. Uses CPAP with oxygen flow through the night at 3 L/min.  Denies any chest pain. Noticing occasional flutter like sensation in the chest at night but as he lays down to  sleep not anything sustained and no association with lightheadedness dizziness. No syncopal episodes. Has chronic shortness of breath with his underlying COPD issues.  No significant change.  Has been aware of heart murmur for 4 to 5 years.  I do not have any prior echocardiogram results available to review.  Mentions weight has been fluctuating has been able to lose some weight with Ozempic recently.  Not very diligent about salt restriction currently.  Mentions good once with medications.  Smokes 2 packs/day cigarettes. Alcohol quit many years ago. No other substance abuse.  EKG from clinic visit today normal sinus rhythm heart rate 82/min, PR interval 196 ms, QRS duration 130 ms consistent with nonspecific intraventricular conduction delay.  Nonspecific ST-T changes.  Lipid panel from 09/02/2023 noted total  cholesterol 154, HDL 46, LDL 85, triglycerides 133 BUN 9, creatinine 0.7, EGFR 99 Normal transaminases and alkaline phosphatase Hemoglobin 14.7, hematocrit 44.6, WBC 8, platelets 179 Hemoglobin A1c 7.4 Past Medical History:  Diagnosis Date   Abduction deformity of foot, right 05/10/2018   Abscess 02/06/2022   Aneurysm of iliac artery (HCC) 08/12/2016   Aortic root dilatation (HCC) 08/27/2021   Aortic valve stenosis 10/14/2023   At risk for heart failure 01/29/2022   Bilateral carotid artery stenosis 08/12/2016   Candidal intertrigo 02/06/2022   Cellulitis of right leg 08/05/2021   Chronic diarrhea 05/26/2017   Chronic hypoxemic respiratory failure (HCC) 08/11/2023   Chronic obstructive lung disease (HCC) 08/27/2021   Chronic pain of both hips 12/29/2018   Chronic pain of both knees 07/11/2016   Chronic pain of both shoulders 12/29/2018   Chronic ulcer of right foot (HCC) 08/27/2021   Congestive heart failure (HCC) 07/06/2019   Continuous opioid dependence (HCC) 08/10/2023   Contracture of both Achilles tendons 02/01/2018   Essential hypertension 02/10/2012   Hardening of the aorta (main artery of the heart) (HCC) 12/03/2021   Heart murmur 09/03/2023   History of pulmonary embolism 08/27/2021   Joint pain 08/27/2021   Microalbuminuria due to type 2 diabetes mellitus (HCC) 07/25/2022   Mixed hyperlipidemia 07/11/2016   Morbid obesity (HCC) 07/06/2019   Obstructive lung disease (HCC) 07/06/2019   Obstructive sleep apnea (adult) (pediatric) 05/26/2017   Onychomycosis due to dermatophyte 09/28/2018   Osteoarthritis of right knee 12/24/2021   Other specified personal risk factors, not elsewhere classified 12/03/2021   Pre-ulcerative calluses 02/01/2018   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 On coumadin for 3 months.      Last Assessment & Plan:   Relevant Hx:  Course:  Daily Update:  Today's Plan:     Shortness of breath 02/10/2012   Statin not tolerated 12/31/2021   Tobacco abuse 07/06/2019   Active tobacco use 2 packs/day x 34 years     Type 2 diabetes mellitus (HCC) 02/10/2012   A. With retinopathy and neuropathy  B. Long-term use of insulin     Type 2 diabetes mellitus with diabetic neuropathy, with long-term current use of insulin (HCC) 07/11/2016   Ulcer of midfoot due to diabetes mellitus (HCC) 12/03/2021   Venous insufficiency of right leg 08/05/2021   Venous stasis 08/12/2016    No past surgical history on file.  Current Medications: Current Meds  Medication Sig   albuterol  (VENTOLIN  HFA) 108 (90 Base) MCG/ACT inhaler Inhale into the lungs.   aspirin 325 MG tablet  Take by mouth.   BD INSULIN SYRINGE U/F 31G X 5/16 0.5 ML MISC See admin instructions.   BD PEN NEEDLE NANO U/F 32G X 4 MM MISC USE DAILY WITH TOUJEO   Blood Glucose Calibration (LIBERTY GLUCOSE CONTROL) Normal LIQD 1 each by Other route as directed.   Blood Glucose Monitoring Suppl (FIFTY50 GLUCOSE METER 2.0) w/Device KIT Use as instructed   Continuous Glucose Receiver (DEXCOM G6 RECEIVER) DEVI 1 each by Other route as directed.   Continuous Glucose Sensor (DEXCOM G6 SENSOR) MISC 1 each by Other route as directed.   Continuous Glucose Transmitter (DEXCOM G6 TRANSMITTER) MISC 1 each by Other route as directed.   DULoxetine (CYMBALTA) 30 MG capsule    ezetimibe  (ZETIA ) 10 MG tablet Take 1 tablet (10 mg total) by mouth daily.   famotidine  (PEPCID ) 20 MG tablet Take 20 mg by mouth daily.   furosemide  (LASIX ) 20 MG tablet Take 1 tablet (20 mg total) by mouth daily as needed for fluid or edema (take 20 mg if you have a weight gain of 2-3 pounds overnight or 5 pounds in one week).   gabapentin (NEURONTIN) 300 MG capsule Take 300 mg by mouth at bedtime.   Gabapentin, Once-Daily, 300 MG TABS Take by mouth.   glucose blood (ONETOUCH ULTRA  TEST) test strip 1 each by Other route as directed.   HUMALOG 100 UNIT/ML injection INJECT SUBCUTANEOUSLY 20 UNITS BEFORE BREAKFAST AND LUNCH, AND 30 UNITS BEFORE SUPPER   hydrochlorothiazide (HYDRODIURIL) 25 MG tablet TAKE 1 TABLET BY MOUTH  DAILY   HYDROcodone-acetaminophen (NORCO) 7.5-325 MG tablet Take by mouth.   insulin aspart (NOVOLOG) 100 UNIT/ML injection Inject 20 Units into the skin 3 (three) times daily. INJECT 20 UNIT SUBCUTANEOUSLY TWICE A DAY 20 UNITS TWO TIMES A DAY AND 30 UNITS ONCE AT NIGHT   Insulin Glargine, 2 Unit Dial, (TOUJEO MAX SOLOSTAR) 300 UNIT/ML SOPN INJECT SUBCUTANEOUSLY 100  UNITS NIGHTLY.   Insulin Pen Needle (MM PEN NEEDLES) 32G X 4 MM MISC USE DAILY WITH TOUJEO   losartan (COZAAR) 100 MG tablet TAKE 1 TABLET BY MOUTH  DAILY   melatonin 3 MG TABS tablet Take 6 mg by mouth at bedtime.   metFORMIN (GLUCOPHAGE) 500 MG tablet TAKE 1 TABLET BY MOUTH  TWICE DAILY WITH FOOD   Semaglutide, 2 MG/DOSE, (OZEMPIC, 2 MG/DOSE,) 8 MG/3ML SOPN Inject 2 mg into the skin once a week.     Allergies:   Heparin   Social History   Socioeconomic History   Marital status: Married    Spouse name: Not on file   Number of children: Not on file   Years of education: Not on file   Highest education level: Not on file  Occupational History   Not on file  Tobacco Use   Smoking status: Not on file   Smokeless tobacco: Not on file  Substance and Sexual Activity   Alcohol use: Not on file   Drug use: Not on file   Sexual activity: Not on file  Other Topics Concern   Not on file  Social History Narrative   Not on file   Social Drivers of Health   Financial Resource Strain: Not on file  Food Insecurity: No Food Insecurity (04/22/2021)   Received from [facility]   Hunger Vital Sign    Within the past 12 months, you worried that your food would run out before you got the money to buy more.: Never true    Within the past  12 months, the food you bought just didn't last and you  didn't have money to get more.: Never true  Transportation Needs: Not on file  Physical Activity: Not on file  Stress: Not on file  Social Connections: Unknown (12/06/2021)   Received from [facility]   Social Network    Social Network: Not on file     Family History: The patient's family history is not on file. ROS:   Please see the history of present illness.    All 14 point review of systems negative except as described per history of present illness.  EKGs/Labs/Other Studies Reviewed:    The following studies were reviewed today:   EKG:  EKG Interpretation Date/Time:  Monday January 11 2024 14:21:50 EDT Ventricular Rate:  82 PR Interval:  196 QRS Duration:  130 QT Interval:  368 QTC Calculation: 429 R Axis:   73  Text Interpretation: Sinus rhythm with marked sinus arrhythmia Non-specific intra-ventricular conduction block Possible Inferior infarct , age undetermined When compared with ECG of 12-May-2005 11:51, QRS duration has increased Borderline criteria for Inferior infarct are now Present T wave inversion now evident in Inferior leads T wave inversion now evident in Lateral leads Confirmed by Liborio Hai reddy [phone]) on 01/11/2024 2:46:08 PM    Recent Labs: No results found for requested labs within last 365 days.  Recent Lipid Panel No results found for: CHOL, TRIG, HDL, CHOLHDL, VLDL, LDLCALC, LDLDIRECT  Physical Exam:    VS:  BP 124/66   Pulse 65   Ht 6' 0.5 (1.842 m)   Wt (!) 331 lb (150.1 kg)   SpO2 96%   BMI 44.27 kg/m     Wt Readings from Last 3 Encounters:  01/11/24 (!) 331 lb (150.1 kg)  05/27/23 (!) 327 lb (148.3 kg)     GENERAL:  Well nourished, well developed in no acute distress NECK: No JVD; No carotid bruits CARDIAC: RRR, S1 and S2 present, 4/6 harsh ejection systolic murmur best heard in right upper sternal border. CHEST:  Clear to auscultation without rales, wheezing or rhonchi  Extremities: No pitting pedal edema.  Pulses bilaterally symmetric with radial 2+ and dorsalis pedis 2+.  Chronic venous insufficiency changes bilateral lower extremities without any significant pitting pedal edema today. NEUROLOGIC:  Alert and oriented x 3  Medication Adjustments/Labs and Tests Ordered: Current medicines are reviewed at length with the patient today.  Concerns regarding medicines are outlined above.  Orders Placed This Encounter  Procedures   Ambulatory referral to Vascular Surgery   EKG 12-Lead   ECHOCARDIOGRAM COMPLETE   Meds ordered this encounter  Medications   ezetimibe  (ZETIA ) 10 MG tablet    Sig: Take 1 tablet (10 mg total) by mouth daily.    Dispense:  90 tablet    Refill:  3   furosemide  (LASIX ) 20 MG tablet    Sig: Take 1 tablet (20 mg total) by mouth daily as needed for fluid or edema (take 20 mg if you have a weight gain of 2-3 pounds overnight or 5 pounds in one week).    Dispense:  75 tablet    Refill:  3    Signed, Jamile Rekowski reddy Niambi Smoak, MD, MPH, [facility]. 01/11/2024 3:19 PM    [facility]

## 2024-01-11 NOTE — Assessment & Plan Note (Signed)
 Associated with statin intolerance. Last lipid panel February 2025 with HDL 46 LDL 85, triglycerides 133 and total cholesterol 154. Target LDL below 70 mg/dL.  Reportedly tried various statins and did not tolerate due to muscle aches. Never tried Zetia. Start Zetia 10 mg once daily. If LDL not at goal below 70 mg/dL will proceed with PCSK9 inhibitors.

## 2024-01-11 NOTE — Assessment & Plan Note (Signed)
 Information per PCPs office referral notes. I do not have any information with regards to the imaging.  Will obtain prior imaging test results from PCPs office and will obtain transthoracic echocardiogram through our office scheduled.

## 2024-01-11 NOTE — Assessment & Plan Note (Signed)
 History of bilateral carotid artery stenosis s/p left carotid artery stent done through [facility] vascular team Dr. Annalee Barren. I do not have any significant information available for follow-up since then. He mentions they have moved to a different fast area and he would like to establish care with [facility] health vascular team. Will place a referral.

## 2024-01-11 NOTE — Assessment & Plan Note (Signed)
 Advise strongly to quit smoking. He is not yet willing but is understanding of the risks and will think about it.

## 2024-01-11 NOTE — Assessment & Plan Note (Addendum)
 Physical exam does have harsh systolic murmur consistent with aortic stenosis.  Will review echocardiogram results from outside facility once available.  I would repeat a transthoracic echocardiogram for comprehensive assessment and image review myself.  Briefly discussed with him and his wife today about potential signs and symptoms of severe aortic stenosis such as chest pain, shortness of breath, lightheadedness, syncopal episodes.  Also briefly discussed about potential interventions if the valve is severely stenotic, such as surgical aortic valve replacement versus TAVI.  Given his morbid obesity and pulmonary issues would be a poor candidate for surgical aortic valve replacement and likely option would be TAVI if he does have severe aortic stenosis.

## 2024-01-11 NOTE — Telephone Encounter (Signed)
 Spoke with [facility] crew. Requested patient's Echocardiogram to be faxed to our office at [phone].  Rep- said they would send this information to the care team and would fax the Echo to us 

## 2024-01-11 NOTE — Assessment & Plan Note (Signed)
 Previous history per imaging, will refer to vascular surgery to establish care and follow-up.

## 2024-01-11 NOTE — Assessment & Plan Note (Signed)
 Well-controlled.  Continue current medications hydrochlorothiazide 25 mg once daily and losartan 100 mg once daily. Target below 130/80 mmHg.

## 2024-01-19 ENCOUNTER — Encounter: Payer: Self-pay | Admitting: Cardiology

## 2024-02-18 ENCOUNTER — Ambulatory Visit (HOSPITAL_BASED_OUTPATIENT_CLINIC_OR_DEPARTMENT_OTHER)
Admission: RE | Admit: 2024-02-18 | Discharge: 2024-02-18 | Disposition: A | Discharge status: Home / Self Care | Source: Ambulatory Visit

## 2024-02-18 DIAGNOSIS — I350 Nonrheumatic aortic (valve) stenosis: Secondary | ICD-10-CM | POA: Insufficient documentation

## 2024-02-19 ENCOUNTER — Other Ambulatory Visit: Payer: Self-pay | Admitting: Surgery

## 2024-02-19 DIAGNOSIS — M79606 Pain in leg, unspecified: Secondary | ICD-10-CM

## 2024-02-19 DIAGNOSIS — I723 Aneurysm of iliac artery: Secondary | ICD-10-CM

## 2024-02-19 DIAGNOSIS — I6523 Occlusion and stenosis of bilateral carotid arteries: Secondary | ICD-10-CM

## 2024-02-20 ENCOUNTER — Ambulatory Visit: Payer: Self-pay

## 2024-02-20 LAB — ECHOCARDIOGRAM COMPLETE
AR max vel: 1.5 cm2
AV Area VTI: 1.49 cm2
AV Area mean vel: 1.6 cm2
AV Mean grad: 25.4 mmHg
AV Peak grad: 41 mmHg
Ao pk vel: 3.2 m/s
Area-P 1/2: 4.19 cm2
Calc EF: 55.7 %
S' Lateral: 4.9 cm
Single Plane A2C EF: 63 %
Single Plane A4C EF: 48.9 %

## 2024-02-24 NOTE — Telephone Encounter (Signed)
 Left message to return call

## 2024-02-24 NOTE — Telephone Encounter (Signed)
-----   Message from [place] R Madireddy sent at 02/20/2024  8:22 PM EDT ----- Please inform him results from the echocardiogram study show normal pumping function of the heart, the aortic valve stenosis that we discussed in the office appears to be moderate in severity.  This  needs to be followed up and would recommend repeat echocardiogram in 12 months. Please also forward this to his PCPs office. Will review further with him at his office visit follow-up.  Thank you ----- Message ----- From: Interface, [employer] Sent: 02/20/2024   8:14 PM EDT To: Alean JONELLE Kobus, MD

## 2024-02-26 ENCOUNTER — Other Ambulatory Visit: Payer: Self-pay

## 2024-02-26 ENCOUNTER — Telehealth: Payer: Self-pay

## 2024-02-26 DIAGNOSIS — I350 Nonrheumatic aortic (valve) stenosis: Secondary | ICD-10-CM

## 2024-02-26 NOTE — Telephone Encounter (Signed)
 Called the patient's wife [name] and reviewed the patient's echo results with her. [name] verbalized understanding and had no further questions at this time.

## 2024-02-26 NOTE — Telephone Encounter (Signed)
 Pt's wife is returning a call regarding ECHO results. Please advise.

## 2024-03-07 ENCOUNTER — Telehealth: Payer: Self-pay

## 2024-03-07 NOTE — Telephone Encounter (Signed)
 Echo Results reviewed with pt as per Dr. Madireddy's note.  Pt verbalized understanding and had no additional questions. Routed to PCP

## 2024-03-08 ENCOUNTER — Ambulatory Visit: Admitting: Cardiology

## 2024-03-14 ENCOUNTER — Encounter: Payer: Self-pay | Admitting: Surgery

## 2024-03-14 ENCOUNTER — Ambulatory Visit (HOSPITAL_COMMUNITY)
Admission: RE | Admit: 2024-03-14 | Discharge: 2024-03-14 | Disposition: A | Discharge status: Home / Self Care | Source: Ambulatory Visit | Attending: Surgery | Admitting: Surgery

## 2024-03-14 ENCOUNTER — Ambulatory Visit (HOSPITAL_BASED_OUTPATIENT_CLINIC_OR_DEPARTMENT_OTHER)
Admission: RE | Admit: 2024-03-14 | Discharge: 2024-03-14 | Discharge status: Home / Self Care | Source: Ambulatory Visit | Attending: Surgery | Admitting: Surgery

## 2024-03-14 ENCOUNTER — Ambulatory Visit (HOSPITAL_BASED_OUTPATIENT_CLINIC_OR_DEPARTMENT_OTHER)
Admission: RE | Admit: 2024-03-14 | Discharge: 2024-03-14 | Disposition: A | Discharge status: Home / Self Care | Source: Ambulatory Visit | Attending: Surgery | Admitting: Surgery

## 2024-03-14 ENCOUNTER — Ambulatory Visit (HOSPITAL_COMMUNITY): Admitting: Surgery

## 2024-03-14 VITALS — BP 146/72 | HR 78 | Temp 98.2°F | Resp 24 | Ht 73.0 in | Wt 327.9 lb

## 2024-03-14 DIAGNOSIS — I6523 Occlusion and stenosis of bilateral carotid arteries: Secondary | ICD-10-CM | POA: Diagnosis not present

## 2024-03-14 DIAGNOSIS — I723 Aneurysm of iliac artery: Secondary | ICD-10-CM | POA: Diagnosis not present

## 2024-03-14 DIAGNOSIS — M79606 Pain in leg, unspecified: Secondary | ICD-10-CM | POA: Diagnosis not present

## 2024-03-14 DIAGNOSIS — I7143 Infrarenal abdominal aortic aneurysm, without rupture: Secondary | ICD-10-CM

## 2024-03-14 LAB — VAS US ABI WITH/WO TBI
Left ABI: 0.95
Right ABI: 0.96

## 2024-03-14 NOTE — Progress Notes (Signed)
 Vascular and Vein Specialist of [facility]  Patient name: Darren Hunt MRN: [id number] DOB: [birth year] Sex: male   REQUESTING PROVIDER:    Dr. Liborio   REASON FOR CONSULT:    Vascular eval  HISTORY OF PRESENT ILLNESS:   Darren Hunt is a 73 y.o. male, who is referred for vascular evaluation.  He was previously a patient of Dr. Sheela in [facility].  He has undergone left carotid stenting.  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  The patient suffers from COPD and is on 3 L oxygen at night.  He has known coronary artery disease with history of PCI.  He reports a history of DVT and PE around the time of a lower extremity surgery.  PAST MEDICAL HISTORY    Past Medical History:  Diagnosis Date   Abduction deformity of foot, right 05/10/2018   Abscess 02/06/2022   Aneurysm of iliac artery (HCC) 08/12/2016   Aortic root dilatation (HCC) 08/27/2021   Aortic valve stenosis 10/14/2023   At risk for heart failure 01/29/2022   Bilateral carotid artery stenosis 08/12/2016   Candidal intertrigo 02/06/2022   Cellulitis of right leg 08/05/2021   Chronic diarrhea 05/26/2017   Chronic hypoxemic respiratory failure (HCC) 08/11/2023   Chronic obstructive lung disease (HCC) 08/27/2021   Chronic pain of both hips 12/29/2018   Chronic pain of both knees 07/11/2016   Chronic pain of both shoulders 12/29/2018   Chronic ulcer of right foot (HCC) 08/27/2021   Congestive heart failure (HCC) 07/06/2019   Continuous opioid dependence (HCC) 08/10/2023   Contracture of both Achilles tendons 02/01/2018   Essential hypertension 02/10/2012   Hardening of the aorta (main artery of the heart) (HCC) 12/03/2021   Heart murmur 09/03/2023   History of pulmonary embolism 08/27/2021   Joint pain 08/27/2021   Microalbuminuria due to type 2 diabetes mellitus (HCC) 07/25/2022   Mixed hyperlipidemia  07/11/2016   Morbid obesity (HCC) 07/06/2019   Obstructive lung disease (HCC) 07/06/2019   Obstructive sleep apnea (adult) (pediatric) 05/26/2017   Onychomycosis due to dermatophyte 09/28/2018   Osteoarthritis of right knee 12/24/2021   Other specified personal risk factors, not elsewhere classified 12/03/2021   Pre-ulcerative calluses 02/01/2018   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 On coumadin for 3 months.     Last Assessment & Plan:   Relevant Hx:  Course:  Daily Update:  Today's Plan:     Shortness of breath 02/10/2012   Statin not tolerated 12/31/2021   Tobacco abuse 07/06/2019   Active tobacco use 2 packs/day x 34 years     Type 2 diabetes mellitus (HCC) 02/10/2012   A. With retinopathy and neuropathy  B. Long-term use of insulin     Type 2 diabetes mellitus with diabetic neuropathy, with long-term current use of insulin (HCC) 07/11/2016   Ulcer of midfoot due to diabetes mellitus (HCC) 12/03/2021   Venous insufficiency of right leg 08/05/2021   Venous stasis 08/12/2016     FAMILY HISTORY   History reviewed. No pertinent family history.  SOCIAL HISTORY:   Social History   Socioeconomic History   Marital status: Married    Spouse name: Not on file   Number of children: Not on file   Years of education: Not on file  Highest education level: Not on file  Occupational History   Not on file  Tobacco Use   Smoking status: Every Day    Current packs/day: 2.00    Types: Cigarettes   Smokeless tobacco: Never  Vaping Use   Vaping status: Never Used  Substance and Sexual Activity   Alcohol use: Not on file   Drug use: Not on file   Sexual activity: Not on file  Other Topics Concern   Not on file  Social History Narrative   Not on file   Social Drivers of Health   Financial Resource Strain: Not on file   Food Insecurity: No Food Insecurity (04/22/2021)   [facility]   Hunger Vital Sign    Within the past 12 months, you worried that your food would run out before you got the money to buy more.: Never true    Within the past 12 months, the food you bought just didn't last and you didn't have money to get more.: Never true  Transportation Needs: Not on file  Physical Activity: Not on file  Stress: Not on file  Social Connections: Unknown (12/06/2021)   [facility]   Social Network    Social Network: Not on file  Intimate Partner Violence: Unknown (10/29/2021)   Received from [facility]   HITS    Physically Hurt: Not on file    Insult or Talk Down To: Not on file    Threaten Physical Harm: Not on file    Scream or Curse: Not on file    ALLERGIES:    Allergies  Allergen Reactions   Heparin     Other reaction(s): Agitation    CURRENT MEDICATIONS:    Current Outpatient Medications  Medication Sig Dispense Refill   acarbose (PRECOSE) 50 MG tablet Take by mouth. (Patient not taking: Reported on 03/14/2024)     albuterol  (VENTOLIN  HFA) 108 (90 Base) MCG/ACT inhaler Inhale into the lungs.     aspirin 325 MG tablet Take by mouth.     BD INSULIN SYRINGE U/F 31G X 5/16 0.5 ML MISC See admin instructions.     BD PEN NEEDLE NANO U/F 32G X 4 MM MISC USE DAILY WITH TOUJEO     Blood Glucose Calibration (LIBERTY GLUCOSE CONTROL) Normal LIQD 1 each by Other route as directed.     Blood Glucose Monitoring Suppl (FIFTY50 GLUCOSE METER 2.0) w/Device KIT Use as instructed     canagliflozin (INVOKANA) 100 MG TABS tablet daily. (Patient not taking: Reported on 03/14/2024)     ciprofloxacin (CIPRO) 500 MG tablet 2 (two) times daily. (Patient not taking: Reported on 03/14/2024)     clindamycin (CLEOCIN) 300 MG capsule 2 (two) times daily. (Patient not taking: Reported on 03/14/2024)     Continuous Glucose Receiver (DEXCOM G6 RECEIVER) DEVI 1 each by Other route as  directed.     Continuous Glucose Sensor (DEXCOM G6 SENSOR) MISC 1 each by Other route as directed.     Continuous Glucose Transmitter (DEXCOM G6 TRANSMITTER) MISC 1 each by Other route as directed.     cyclobenzaprine (FLEXERIL) 5 MG tablet Take by mouth. (Patient not taking: Reported on 01/11/2024)     DULoxetine (CYMBALTA) 30 MG capsule      erythromycin with ethanol (THERAMYCIN) 2 % external solution Apply topically. (Patient not taking: Reported on 03/14/2024)     ezetimibe  (ZETIA ) 10 MG tablet Take 1 tablet (10 mg total) by mouth daily. 90 tablet 3  famotidine  (PEPCID ) 20 MG tablet Take 20 mg by mouth daily.     fluconazole (DIFLUCAN) 150 MG tablet TK 1 T PO NOW. REPEAT IN 3 DAYS (Patient not taking: Reported on 03/14/2024)     Fluticasone-Umeclidin-Vilant (TRELEGY ELLIPTA) 100-62.5-25 MCG/ACT AEPB Inhale 1 puff into the lungs daily. (Patient not taking: Reported on 03/14/2024)     furosemide  (LASIX ) 20 MG tablet Take 1 tablet (20 mg total) by mouth daily as needed for fluid or edema (take 20 mg if you have a weight gain of 2-3 pounds overnight or 5 pounds in one week). 75 tablet 3   gabapentin (NEURONTIN) 300 MG capsule Take 300 mg by mouth at bedtime.     Gabapentin, Once-Daily, 300 MG TABS Take by mouth.     glipiZIDE (GLUCOTROL XL) 5 MG 24 hr tablet Take 1 tablet by mouth daily. (Patient not taking: Reported on 03/14/2024)     glucose blood (ONETOUCH ULTRA TEST) test strip 1 each by Other route as directed.     HUMALOG 100 UNIT/ML injection INJECT SUBCUTANEOUSLY 20 UNITS BEFORE BREAKFAST AND LUNCH, AND 30 UNITS BEFORE SUPPER     hydrochlorothiazide (HYDRODIURIL) 25 MG tablet TAKE 1 TABLET BY MOUTH  DAILY     HYDROcodone-acetaminophen (NORCO) 7.5-325 MG tablet Take by mouth.     insulin aspart (NOVOLOG) 100 UNIT/ML injection Inject 20 Units into the skin 3 (three) times daily. INJECT 20 UNIT SUBCUTANEOUSLY TWICE A DAY 20 UNITS TWO TIMES A DAY AND 30 UNITS ONCE AT NIGHT     Insulin Glargine, 2  Unit Dial, (TOUJEO MAX SOLOSTAR) 300 UNIT/ML SOPN INJECT SUBCUTANEOUSLY 100  UNITS NIGHTLY.     insulin lispro (HUMALOG) 100 UNIT/ML injection INJECT SUBCUTANEOUSLY 20  UNITS BEFORE BREAKFAST AND  LUNCH, AND 30 UNITS BEFORE  SUPPER (Patient not taking: Reported on 01/11/2024)     insulin NPH-regular Human (NOVOLIN 70/30 RELION) (70-30) 100 UNIT/ML injection 50 Units 3 (three) times a day. (Patient not taking: Reported on 01/11/2024)     Insulin Pen Needle (MM PEN NEEDLES) 32G X 4 MM MISC USE DAILY WITH TOUJEO     latanoprost (XALATAN) 0.005 % ophthalmic solution Apply to eye. (Patient not taking: Reported on 01/11/2024)     losartan (COZAAR) 100 MG tablet TAKE 1 TABLET BY MOUTH  DAILY     melatonin 3 MG TABS tablet Take 6 mg by mouth at bedtime.     metFORMIN (GLUCOPHAGE) 500 MG tablet TAKE 1 TABLET BY MOUTH  TWICE DAILY WITH FOOD     ondansetron  (ZOFRAN  ODT) 4 MG disintegrating tablet Take 1 tablet (4 mg total) by mouth every 8 (eight) hours as needed for nausea or vomiting. (Patient not taking: Reported on 01/11/2024) 20 tablet 0   pravastatin (PRAVACHOL) 20 MG tablet Take by mouth. (Patient not taking: Reported on 01/11/2024)     Semaglutide, 2 MG/DOSE, (OZEMPIC, 2 MG/DOSE,) 8 MG/3ML SOPN Inject 2 mg into the skin once a week.     spironolactone (ALDACTONE) 50 MG tablet Take 1 tablet by mouth daily. (Patient not taking: Reported on 01/11/2024)     tiotropium (SPIRIVA HANDIHALER) 18 MCG inhalation capsule INHALE THE CONTENTS OF 1  CAPSULE BY MOUTH VIA  HANDIHALER DAILY (Patient not taking: Reported on 01/11/2024)     traMADol (ULTRAM) 50 MG tablet Take 50 mg by mouth as directed. (Patient not taking: Reported on 01/11/2024)     No current facility-administered medications for this visit.    REVIEW OF SYSTEMS:   [X]  denotes  positive finding, [ ]  denotes negative finding Cardiac  Comments:  Chest pain or chest pressure:    Shortness of breath upon exertion: x   Short of breath when lying flat:     Irregular heart rhythm:        Vascular    Pain in calf, thigh, or hip brought on by ambulation:    Pain in feet at night that wakes you up from your sleep:     Blood clot in your veins: x   Leg swelling:  x       Pulmonary    Oxygen at home:    Productive cough:     Wheezing:         Neurologic    Sudden weakness in arms or legs:     Sudden numbness in arms or legs:     Sudden onset of difficulty speaking or slurred speech:    Temporary loss of vision in one eye:     Problems with dizziness:         Gastrointestinal    Blood in stool:      Vomited blood:         Genitourinary    Burning when urinating:     Blood in urine:        Psychiatric    Major depression:         Hematologic    Bleeding problems:    Problems with blood clotting too easily:        Skin    Rashes or ulcers:        Constitutional    Fever or chills:     PHYSICAL EXAM:   Vitals:   03/14/24 1100 03/14/24 1104  BP: (!) 154/66 (!) 146/72  Pulse: 78   Resp: (!) 24   Temp: 98.2 F (36.8 C)   TempSrc: Temporal   SpO2: 95%   Weight: (!) 327 lb 14.4 oz (148.7 kg)   Height: 6' 1 (1.854 m)     GENERAL: The patient is a well-nourished male, in no acute distress. The vital signs are documented above. CARDIAC: There is a regular rate and rhythm.  VASCULAR: Bilateral lower extremity edema with hyperpigmentation.  Pedal pulses not palpable.  No carotid bruits. PULMONARY: Nonlabored respirations ABDOMEN: Soft and non-tender with normal pitched bowel sounds.  MUSCULOSKELETAL: There are no major deformities or cyanosis. NEUROLOGIC: No focal weakness or paresthesias are detected. SKIN: There are no ulcers or rashes noted. PSYCHIATRIC: The patient has a normal affect.  STUDIES:   I have reviewed the following: ABI/TBIToday's ABIToday's TBIPrevious ABIPrevious TBI  +-------+-----------+-----------+------------+------------+  Right 0.96       0.93                                  +-------+-----------+-----------+------------+------------+  Left  0.95       0.57                                 +-------+-----------+-----------+------------+------------+   Right toe pressure: 154 Left toe pressure: 94 All waveforms are biphasic   Carotid Right Carotid: Velocities in the right ICA are consistent with a 1-39%  stenosis.   Left Carotid: The ECA appears >50% stenosed. Patent CCA-ICA stent.   Vertebrals:  Bilateral vertebral arteries demonstrate antegrade flow.  Subclavians: Left subclavian artery was stenotic. Normal flow hemodynamics  were  seen in the right subclavian artery.    Aorta: Abdominal Aorta: There is evidence of abnormal dilation of the Left Common  Iliac artery.  IVC/Iliac: There is no evidence of thrombus involving the IVC.  ASSESSMENT and PLAN   Carotid: He is asymptomatic.  He has a history of a left carotid stent placed in [place].  This is widely patent by ultrasound.  I will plan on repeating his carotid in 1 year  Aorta: He allegedly had been followed for a iliac/abdominal aortic aneurysm.  These appear just to be ectatic today.  I will continue to follow these with a repeat ultrasound in 1 year.   Malvina Serene CLORE, MD, FACS Vascular and Vein Specialists of [facility] [phone] Pager [phone]

## 2024-03-22 ENCOUNTER — Other Ambulatory Visit: Payer: Self-pay

## 2024-03-23 ENCOUNTER — Ambulatory Visit: Admitting: Cardiology

## 2024-04-18 ENCOUNTER — Encounter: Payer: Self-pay | Admitting: Orthopaedic Surgery

## 2024-04-18 ENCOUNTER — Ambulatory Visit: Admitting: Orthopaedic Surgery

## 2024-04-18 DIAGNOSIS — M25561 Pain in right knee: Secondary | ICD-10-CM

## 2024-04-18 DIAGNOSIS — M25562 Pain in left knee: Secondary | ICD-10-CM

## 2024-04-18 DIAGNOSIS — G8929 Other chronic pain: Secondary | ICD-10-CM

## 2024-04-18 MED ORDER — LIDOCAINE HCL 1 % IJ SOLN
3.0000 mL | INTRAMUSCULAR | Status: AC | PRN
  Administered 2024-04-18: 3 mL

## 2024-04-18 MED ORDER — METHYLPREDNISOLONE ACETATE 40 MG/ML IJ SUSP
40.0000 mg | INTRAMUSCULAR | Status: AC | PRN
  Administered 2024-04-18: 40 mg via INTRA_ARTICULAR

## 2024-04-18 NOTE — Progress Notes (Signed)
 The patient is a 73 year old gentleman that we have seen before.  He has known arthritis in both of his knees and comes in today requesting a steroid injection in both knees today.  He has someone who is not a surgical candidate given his morbid obesity and other comorbidities but steroid injections have helped him quite a bit in the past.  It has been 11 months since we have injected his knees.  He denies any acute changes in medical status.  He is a type II diabetic but reports good control.  Both knees have slight varus malalignment with some global tenderness and pain.  Both legs have venous stasis changes.  Per the patient's request I did place a steroid injection in both knees today which he tolerated well.  He knows to watch his blood glucose and carbohydrate intake.  Injections can be done about 4 months apart in a diabetic but only if needed.    Procedure Note  Patient: Darren Hunt             Date of Birth: 31-Aug-1950           MRN: [id number]             Visit Date: 04/18/2024  Procedures: Visit Diagnoses:  1. Chronic pain of right knee   2. Chronic pain of left knee     Large Joint Inj: R knee on 04/18/2024 3:15 PM Indications: diagnostic evaluation and pain Details: 22 G 1.5 in needle, superolateral approach  Arthrogram: No  Medications: 3 mL lidocaine  1 %; 40 mg methylPREDNISolone  acetate 40 MG/ML Outcome: tolerated well, no immediate complications Procedure, treatment alternatives, risks and benefits explained, specific risks discussed. Consent was given by the patient. Immediately prior to procedure a time out was called to verify the correct patient, procedure, equipment, support staff and site/side marked as required. Patient was prepped and draped in the usual sterile fashion.    Large Joint Inj: L knee on 04/18/2024 3:16 PM Indications: diagnostic evaluation and pain Details: 22 G 1.5 in needle, superolateral approach  Arthrogram: No  Medications: 3 mL  lidocaine  1 %; 40 mg methylPREDNISolone  acetate 40 MG/ML Outcome: tolerated well, no immediate complications Procedure, treatment alternatives, risks and benefits explained, specific risks discussed. Consent was given by the patient. Immediately prior to procedure a time out was called to verify the correct patient, procedure, equipment, support staff and site/side marked as required. Patient was prepped and draped in the usual sterile fashion.

## 2024-04-28 ENCOUNTER — Telehealth: Payer: Self-pay

## 2024-04-28 NOTE — Telephone Encounter (Signed)
 Pt's spouse Norma is requesting a callback regarding pt being in [place]  right now and pt had a stroke. She stated they've been trying to get him back home back but the hospital [facility]) he's in is requesting certain information regarding them wanting to schedule a 2 week appt for vascular surgery and him being able to go to [facility]. She'd like to discuss further once a nurse calls her back. Please advise

## 2024-05-04 HISTORY — PX: IVC FILTER INSERTION: CATH118245

## 2024-05-07 ENCOUNTER — Observation Stay (HOSPITAL_COMMUNITY): Admission: EM | Admit: 2024-05-07 | Discharge: 2024-05-10 | Attending: Internal Medicine | Admitting: Internal Medicine

## 2024-05-07 ENCOUNTER — Encounter (HOSPITAL_COMMUNITY): Payer: Self-pay

## 2024-05-07 ENCOUNTER — Other Ambulatory Visit: Payer: Self-pay

## 2024-05-07 ENCOUNTER — Inpatient Hospital Stay (HOSPITAL_COMMUNITY)

## 2024-05-07 ENCOUNTER — Emergency Department (HOSPITAL_COMMUNITY)

## 2024-05-07 ENCOUNTER — Encounter (HOSPITAL_COMMUNITY): Payer: Self-pay | Admitting: Emergency Medicine

## 2024-05-07 ENCOUNTER — Inpatient Hospital Stay: Admit: 2024-05-07 | Admitting: Internal Medicine

## 2024-05-07 DIAGNOSIS — I2510 Atherosclerotic heart disease of native coronary artery without angina pectoris: Secondary | ICD-10-CM | POA: Diagnosis not present

## 2024-05-07 DIAGNOSIS — I82432 Acute embolism and thrombosis of left popliteal vein: Secondary | ICD-10-CM | POA: Diagnosis not present

## 2024-05-07 DIAGNOSIS — E119 Type 2 diabetes mellitus without complications: Secondary | ICD-10-CM | POA: Diagnosis not present

## 2024-05-07 DIAGNOSIS — I639 Cerebral infarction, unspecified: Secondary | ICD-10-CM | POA: Diagnosis not present

## 2024-05-07 DIAGNOSIS — R509 Fever, unspecified: Secondary | ICD-10-CM | POA: Diagnosis present

## 2024-05-07 DIAGNOSIS — Z8679 Personal history of other diseases of the circulatory system: Secondary | ICD-10-CM | POA: Insufficient documentation

## 2024-05-07 DIAGNOSIS — R1310 Dysphagia, unspecified: Secondary | ICD-10-CM

## 2024-05-07 DIAGNOSIS — I6389 Other cerebral infarction: Secondary | ICD-10-CM | POA: Diagnosis not present

## 2024-05-07 DIAGNOSIS — F17210 Nicotine dependence, cigarettes, uncomplicated: Secondary | ICD-10-CM | POA: Diagnosis not present

## 2024-05-07 DIAGNOSIS — I609 Nontraumatic subarachnoid hemorrhage, unspecified: Secondary | ICD-10-CM

## 2024-05-07 DIAGNOSIS — R432 Parageusia: Secondary | ICD-10-CM

## 2024-05-07 DIAGNOSIS — I10 Essential (primary) hypertension: Secondary | ICD-10-CM | POA: Diagnosis not present

## 2024-05-07 DIAGNOSIS — J449 Chronic obstructive pulmonary disease, unspecified: Secondary | ICD-10-CM | POA: Diagnosis not present

## 2024-05-07 DIAGNOSIS — J9611 Chronic respiratory failure with hypoxia: Secondary | ICD-10-CM | POA: Diagnosis not present

## 2024-05-07 DIAGNOSIS — E785 Hyperlipidemia, unspecified: Secondary | ICD-10-CM | POA: Diagnosis not present

## 2024-05-07 DIAGNOSIS — Z86718 Personal history of other venous thrombosis and embolism: Secondary | ICD-10-CM | POA: Insufficient documentation

## 2024-05-07 DIAGNOSIS — F39 Unspecified mood [affective] disorder: Secondary | ICD-10-CM | POA: Diagnosis not present

## 2024-05-07 DIAGNOSIS — I82409 Acute embolism and thrombosis of unspecified deep veins of unspecified lower extremity: Secondary | ICD-10-CM

## 2024-05-07 DIAGNOSIS — Z9889 Other specified postprocedural states: Secondary | ICD-10-CM

## 2024-05-07 DIAGNOSIS — I2699 Other pulmonary embolism without acute cor pulmonale: Secondary | ICD-10-CM | POA: Diagnosis not present

## 2024-05-07 DIAGNOSIS — Z95828 Presence of other vascular implants and grafts: Secondary | ICD-10-CM | POA: Insufficient documentation

## 2024-05-07 DIAGNOSIS — F319 Bipolar disorder, unspecified: Secondary | ICD-10-CM | POA: Insufficient documentation

## 2024-05-07 DIAGNOSIS — E7849 Other hyperlipidemia: Secondary | ICD-10-CM

## 2024-05-07 DIAGNOSIS — Z959 Presence of cardiac and vascular implant and graft, unspecified: Secondary | ICD-10-CM | POA: Diagnosis not present

## 2024-05-07 DIAGNOSIS — G8194 Hemiplegia, unspecified affecting left nondominant side: Secondary | ICD-10-CM

## 2024-05-07 DIAGNOSIS — I2601 Septic pulmonary embolism with acute cor pulmonale: Secondary | ICD-10-CM

## 2024-05-07 DIAGNOSIS — Z86711 Personal history of pulmonary embolism: Secondary | ICD-10-CM | POA: Diagnosis present

## 2024-05-07 DIAGNOSIS — L89159 Pressure ulcer of sacral region, unspecified stage: Secondary | ICD-10-CM | POA: Diagnosis not present

## 2024-05-07 DIAGNOSIS — I82402 Acute embolism and thrombosis of unspecified deep veins of left lower extremity: Secondary | ICD-10-CM

## 2024-05-07 DIAGNOSIS — E782 Mixed hyperlipidemia: Secondary | ICD-10-CM | POA: Diagnosis present

## 2024-05-07 DIAGNOSIS — I629 Nontraumatic intracranial hemorrhage, unspecified: Secondary | ICD-10-CM

## 2024-05-07 LAB — COMPREHENSIVE METABOLIC PANEL WITH GFR
ALT: 38 U/L (ref 0–44)
AST: 40 U/L (ref 15–41)
Albumin: 3.2 g/dL — ABNORMAL LOW (ref 3.5–5.0)
Alkaline Phosphatase: 55 U/L (ref 38–126)
Anion gap: 13 (ref 5–15)
BUN: 21 mg/dL (ref 8–23)
CO2: 29 mmol/L (ref 22–32)
Calcium: 9.2 mg/dL (ref 8.9–10.3)
Chloride: 95 mmol/L — ABNORMAL LOW (ref 98–111)
Creatinine, Ser: 0.98 mg/dL (ref 0.61–1.24)
GFR, Estimated: >60 mL/min (ref >60)
Glucose, Bld: 164 mg/dL — ABNORMAL HIGH (ref 70–99)
Potassium: 4.5 mmol/L (ref 3.5–5.1)
Sodium: 137 mmol/L (ref 135–145)
Total Bilirubin: 2.2 mg/dL — ABNORMAL HIGH (ref 0.0–1.2)
Total Protein: 7 g/dL (ref 6.5–8.1)

## 2024-05-07 LAB — CBC WITH DIFFERENTIAL/PLATELET
Abs Immature Granulocytes: 0.05 K/uL (ref 0.00–0.07)
Basophils Absolute: 0.1 K/uL (ref 0.0–0.1)
Basophils Relative: 1 %
Eosinophils Absolute: 0.2 K/uL (ref 0.0–0.5)
Eosinophils Relative: 2 %
HCT: 47.4 % (ref 39.0–52.0)
Hemoglobin: 16.1 g/dL (ref 13.0–17.0)
Immature Granulocytes: 1 %
Lymphocytes Relative: 19 %
Lymphs Abs: 1.9 K/uL (ref 0.7–4.0)
MCH: 30.8 pg (ref 26.0–34.0)
MCHC: 34 g/dL (ref 30.0–36.0)
MCV: 90.8 fL (ref 80.0–100.0)
Monocytes Absolute: 0.8 K/uL (ref 0.1–1.0)
Monocytes Relative: 8 %
Neutro Abs: 7.1 K/uL (ref 1.7–7.7)
Neutrophils Relative %: 69 %
Platelets: 284 K/uL (ref 150–400)
RBC: 5.22 MIL/uL (ref 4.22–5.81)
RDW: 13.2 % (ref 11.5–15.5)
WBC: 10.1 K/uL (ref 4.0–10.5)
nRBC: 0 % (ref 0.0–0.2)

## 2024-05-07 LAB — HEMOGLOBIN A1C
Hgb A1c MFr Bld: 6.7 % — ABNORMAL HIGH (ref 4.8–5.6)
Mean Plasma Glucose: 145.59 mg/dL

## 2024-05-07 LAB — CBG MONITORING, ED: Glucose-Capillary: 157 mg/dL — ABNORMAL HIGH (ref 70–99)

## 2024-05-07 MED ORDER — HYDROCODONE-ACETAMINOPHEN 7.5-325 MG PO TABS
1.0000 | ORAL_TABLET | Freq: Four times a day (QID) | ORAL | Status: DC | PRN
Start: 2024-05-07 — End: 2024-05-10
  Administered 2024-05-08 – 2024-05-10 (×8): 1 via ORAL
  Filled 2024-05-07 (×8): qty 1

## 2024-05-07 MED ORDER — IPRATROPIUM-ALBUTEROL 0.5-2.5 (3) MG/3ML IN SOLN: 3.0000 mL | RESPIRATORY_TRACT | Status: DC | PRN

## 2024-05-07 MED ORDER — ASPIRIN 81 MG PO TBEC
81.0000 mg | DELAYED_RELEASE_TABLET | Freq: Every day | ORAL | Status: DC
  Administered 2024-05-07 – 2024-05-08 (×2): 81 mg via ORAL
  Filled 2024-05-07 (×3): qty 1

## 2024-05-07 MED ORDER — ACETAMINOPHEN 325 MG PO TABS: 650.0000 mg | ORAL_TABLET | Freq: Four times a day (QID) | ORAL | Status: DC | PRN

## 2024-05-07 MED ORDER — UMECLIDINIUM BROMIDE 62.5 MCG/ACT IN AEPB
1.0000 | INHALATION_SPRAY | Freq: Every day | RESPIRATORY_TRACT | Status: DC
  Filled 2024-05-07 (×2): qty 7

## 2024-05-07 MED ORDER — POLYETHYLENE GLYCOL 3350 17 G PO PACK
17.0000 g | PACK | Freq: Every day | ORAL | Status: DC
  Administered 2024-05-08 – 2024-05-10 (×3): 17 g via ORAL
  Filled 2024-05-07 (×3): qty 1

## 2024-05-07 MED ORDER — INSULIN ASPART 100 UNIT/ML IJ SOLN
0.0000 [IU] | Freq: Three times a day (TID) | INTRAMUSCULAR | Status: DC
  Administered 2024-05-08 – 2024-05-09 (×4): 1 [IU] via SUBCUTANEOUS

## 2024-05-07 MED ORDER — FUROSEMIDE 40 MG PO TABS
40.0000 mg | ORAL_TABLET | Freq: Every day | ORAL | Status: DC
  Administered 2024-05-08 – 2024-05-10 (×3): 40 mg via ORAL
  Filled 2024-05-07: qty 2
  Filled 2024-05-07 (×2): qty 1

## 2024-05-07 MED ORDER — DULOXETINE HCL 30 MG PO CPEP
30.0000 mg | ORAL_CAPSULE | Freq: Every day | ORAL | Status: DC
  Administered 2024-05-08 – 2024-05-10 (×3): 30 mg via ORAL
  Filled 2024-05-07 (×3): qty 1

## 2024-05-07 MED ORDER — PANTOPRAZOLE SODIUM 40 MG PO TBEC
40.0000 mg | DELAYED_RELEASE_TABLET | Freq: Every day | ORAL | Status: DC
  Administered 2024-05-08 – 2024-05-10 (×3): 40 mg via ORAL
  Filled 2024-05-07 (×3): qty 1

## 2024-05-07 MED ORDER — ATORVASTATIN CALCIUM 40 MG PO TABS
40.0000 mg | ORAL_TABLET | Freq: Every day | ORAL | Status: DC
Start: 2024-05-08 — End: 2024-05-10
  Administered 2024-05-08 – 2024-05-10 (×3): 40 mg via ORAL
  Filled 2024-05-07 (×3): qty 1

## 2024-05-07 MED ORDER — SODIUM CHLORIDE 0.9% FLUSH
3.0000 mL | Freq: Two times a day (BID) | INTRAVENOUS | Status: DC
  Administered 2024-05-08 – 2024-05-09 (×4): 3 mL via INTRAVENOUS

## 2024-05-07 MED ORDER — INSULIN ASPART 100 UNIT/ML IJ SOLN: 0.0000 [IU] | Freq: Every day | INTRAMUSCULAR | Status: DC

## 2024-05-07 MED ORDER — ENOXAPARIN SODIUM 80 MG/0.8ML IJ SOSY
75.0000 mg | PREFILLED_SYRINGE | INTRAMUSCULAR | Status: DC
  Administered 2024-05-08: 75 mg via SUBCUTANEOUS
  Filled 2024-05-07: qty 0.75

## 2024-05-07 MED ORDER — SODIUM CHLORIDE 0.9 % IV SOLN: 250.0000 mL | INTRAVENOUS | Status: AC | PRN

## 2024-05-07 MED ORDER — FLUOXETINE HCL 20 MG PO CAPS
20.0000 mg | ORAL_CAPSULE | Freq: Every day | ORAL | Status: DC
  Administered 2024-05-08 – 2024-05-10 (×3): 20 mg via ORAL
  Filled 2024-05-07 (×3): qty 1

## 2024-05-07 MED ORDER — ACETAMINOPHEN 650 MG RE SUPP: 650.0000 mg | Freq: Four times a day (QID) | RECTAL | Status: DC | PRN

## 2024-05-07 MED ORDER — ONDANSETRON HCL 4 MG/2ML IJ SOLN: 4.0000 mg | Freq: Four times a day (QID) | INTRAMUSCULAR | Status: DC | PRN

## 2024-05-07 MED ORDER — INSULIN GLARGINE 100 UNIT/ML ~~LOC~~ SOLN
10.0000 [IU] | Freq: Every day | SUBCUTANEOUS | Status: DC
  Administered 2024-05-07 – 2024-05-09 (×3): 10 [IU] via SUBCUTANEOUS
  Filled 2024-05-07 (×4): qty 0.1

## 2024-05-07 MED ORDER — LOSARTAN POTASSIUM 50 MG PO TABS
50.0000 mg | ORAL_TABLET | Freq: Every day | ORAL | Status: DC
  Administered 2024-05-08 – 2024-05-10 (×3): 50 mg via ORAL
  Filled 2024-05-07 (×3): qty 1

## 2024-05-07 MED ORDER — ONDANSETRON HCL 4 MG PO TABS: 4.0000 mg | ORAL_TABLET | Freq: Four times a day (QID) | ORAL | Status: DC | PRN

## 2024-05-07 MED ORDER — SODIUM CHLORIDE 0.9% FLUSH: 3.0000 mL | INTRAVENOUS | Status: DC | PRN

## 2024-05-07 MED ORDER — SODIUM CHLORIDE 0.9% FLUSH
3.0000 mL | Freq: Two times a day (BID) | INTRAVENOUS | Status: DC
  Administered 2024-05-07 – 2024-05-09 (×5): 3 mL via INTRAVENOUS

## 2024-05-07 NOTE — ED Triage Notes (Signed)
 Pt BIB [facility] due to SNF placement as patient had stroke.  Pt was on vacation at [facility] [place] when stroke occurred.  Left sided deficits; completely flaccid on left side.  Pt was at [facility] (in [place], [place]) and was being transferred to SNF when facility denied him.  As family lives here, he was transferred to us  for SNF placement. Pt currently on 3L nasal cannula at baseline.  Hx hypoxemia. VS BP 146/71, HR 88, Resp 18, SpO2 99% 3 Nasal Cannula.

## 2024-05-07 NOTE — ED Provider Notes (Signed)
 [place] EMERGENCY DEPARTMENT AT [facility] Provider Note   CSN: [id number] Arrival date & time: 05/07/24  1842     Patient presents with: No chief complaint on file.   FREDERICO GERLING is a 73 y.o. male.   73 yo M with a chief complaint of stroke.  Patient was on vacation in [place]  and ended up having a fairly severe stroke.  Was in the hospital.  Has been there for about a week.  He told me they are having trouble getting him placed into a nursing home in [place] where he is from and so eventually his PCP had made some calls to get him transferred here so that he be placed locally.  He denies developing pneumonia denies urinary tract infection denies cough congestion or fever.  He is on oxygen which he thinks is due to not tolerating their CPAP machine at the outside hospital.  [place] he normally uses that just at night.        Prior to Admission medications   Medication Sig Start Date End Date Taking? Authorizing Provider  albuterol  (VENTOLIN  HFA) 108 (90 Base) MCG/ACT inhaler Inhale into the lungs. 06/23/18   [provider]  aspirin 325 MG tablet Take by mouth.    [provider]  cyclobenzaprine (FLEXERIL) 5 MG tablet Take by mouth. Patient not taking: Reported on 01/11/2024    [provider]  DULoxetine (CYMBALTA) 30 MG capsule  06/15/19   [provider]  ezetimibe  (ZETIA ) 10 MG tablet Take 1 tablet (10 mg total) by mouth daily. 01/11/24   Madireddy, Alean JONELLE, MD  famotidine  (PEPCID ) 20 MG tablet Take 20 mg by mouth daily. 08/12/22   [provider]  furosemide  (LASIX ) 20 MG tablet Take 1 tablet (20 mg total) by mouth daily as needed for fluid or edema (take 20 mg if you have a weight gain of 2-3 pounds overnight or 5 pounds in one week). 01/11/24   Madireddy, Alean JONELLE, MD  gabapentin (NEURONTIN) 300 MG capsule Take 300 mg by mouth at bedtime. 10/31/21   [provider]  Gabapentin, Once-Daily, 300 MG TABS  Take by mouth.    [provider]  glucose blood (ONETOUCH ULTRA TEST) test strip 1 each by Other route as directed. 08/18/19   [provider]  HUMALOG 100 UNIT/ML injection INJECT SUBCUTANEOUSLY 20 UNITS BEFORE BREAKFAST AND LUNCH, AND 30 UNITS BEFORE SUPPER 03/14/19   [provider]  hydrochlorothiazide (HYDRODIURIL) 25 MG tablet TAKE 1 TABLET BY MOUTH  DAILY 06/15/19   [provider]  HYDROcodone-acetaminophen (NORCO) 7.5-325 MG tablet Take by mouth. 05/24/14   [provider]  insulin aspart (NOVOLOG) 100 UNIT/ML injection Inject 20 Units into the skin 3 (three) times daily. INJECT 20 UNIT SUBCUTANEOUSLY TWICE A DAY 20 UNITS TWO TIMES A DAY AND 30 UNITS ONCE AT NIGHT 10/12/20   [provider]  Insulin Glargine, 2 Unit Dial, (TOUJEO MAX SOLOSTAR) 300 UNIT/ML SOPN INJECT SUBCUTANEOUSLY 100  UNITS NIGHTLY. 06/15/19   [provider]  losartan (COZAAR) 100 MG tablet TAKE 1 TABLET BY MOUTH  DAILY 05/12/14   [provider]  melatonin 3 MG TABS tablet Take 6 mg by mouth at bedtime.    [provider]  metFORMIN (GLUCOPHAGE) 500 MG tablet TAKE 1 TABLET BY MOUTH  TWICE DAILY WITH FOOD 06/15/19   [provider]  Semaglutide, 2 MG/DOSE, (OZEMPIC, 2 MG/DOSE,) 8 MG/3ML SOPN Inject 2 mg into the skin once a  week. 07/15/23   [provider]    Allergies: Heparin    Review of Systems  Updated Vital Signs BP (!) 147/67 (BP Location: Right Arm)   Pulse (!) 59   Temp 98.6 F (37 C) (Oral)   Resp 18   Ht 6' 1 (1.854 m)   Wt (!) 149.7 kg   SpO2 99%   BMI 43.54 kg/m   Physical Exam Vitals and nursing note reviewed.  Constitutional:      Appearance: He is well-developed.  HENT:     Head: Normocephalic.     Comments: Bruising to the left side of his face Eyes:     Pupils: Pupils are equal, round, and reactive to light.  Neck:     Vascular: No JVD.  Cardiovascular:     Rate and Rhythm: Normal  rate and regular rhythm.     Heart sounds: No murmur heard.    No friction rub. No gallop.  Pulmonary:     Effort: No respiratory distress.     Breath sounds: No wheezing.  Abdominal:     General: There is no distension.     Tenderness: There is no abdominal tenderness. There is no guarding or rebound.  Musculoskeletal:        General: Normal range of motion.     Cervical back: Normal range of motion and neck supple.  Skin:    Coloration: Skin is not pale.     Findings: No rash.  Neurological:     Mental Status: He is alert and oriented to person, place, and time.     Comments: L Sided weakness slurred speech  Psychiatric:        Behavior: Behavior normal.     (all labs ordered are listed, but only abnormal results are displayed) Labs Reviewed  CBC  COMPREHENSIVE METABOLIC PANEL WITH GFR  CBC WITH DIFFERENTIAL/PLATELET    EKG: None  Radiology: No results found.   Procedures   Medications Ordered in the ED - No data to display                                  Medical Decision Making Amount and/or Complexity of Data Reviewed Radiology: ordered.   73 yo M with a chief complaint of a stroke.  Patient had a stroke while on vacation has been in the hospital for about a week and was transferred here because he was having trouble getting placed into a nursing facility here where he is from.  I discussed the case with the hospitalist who has more information about his recent hospitalization.  She will admit to the hospital.  The patients results and plan were reviewed and discussed.   Any x-rays performed were independently reviewed by myself.   Differential diagnosis were considered with the presenting HPI.  Medications - No data to display  Vitals:   05/07/24 1851 05/07/24 1855 05/07/24 1901 05/07/24 1904  BP: (!) 147/67 (!) 147/67    Pulse: (!) 101 (!) 59    Resp:  18    Temp:  98.6 F (37 C)    TempSrc:  Oral    SpO2: 93% 90% 99%   Weight:    (!) 149.7  kg  Height:    6' 1 (1.854 m)    Final diagnoses:  Acute ischemic stroke [facility])    Admission/ observation were discussed with the admitting physician, patient and/or family and they are  comfortable with the plan.       Final diagnoses:  Acute ischemic stroke [facility])    ED Discharge Orders     None          Emil Share, DO 05/07/24 1946

## 2024-05-07 NOTE — H&P (Incomplete)
 History and Physical    Darren Hunt FMW:[id number] DOB: 14-Nov-1950 DOA: 05/07/2024  PCP: Nadine Columbus, FNP   Patient coming from: Home   Chief Complaint: Transfer from outside facility for SNF placement  HPI:  Darren Hunt is a 73 y.o. male with medical history significant of bilateral carotid artery stent, CAD with cardiac stent, essential hypertension, insulin-dependent DM type II, hyperlipidemia, obstructive sleep apnea, chronic smoking cigarette, COPD, chronic hypoxic nocturnal respiratory failure on 3 L oxygen, aortic atherosclerosis, aortic root dilation, hyperlipidemia, history of DVT and PE 2023 who initially admitted to [facility], [place], [place]. Patient was transferred to [facility] as per family request.  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  Last head CT scan from 10/7 showed petechial hemorrhage with midline shift unchanged.   Later on, found to have left popliteal vein DVT and subsequent CTA chest showed PE but he can't be anticoagulated because of the brain bleed. Neurologist there has recommended to repeat a CT head on Monday and if stable findings, then DOACs can be initiated. He was supposed to get PEG but he and his family refused. He has poor oral intake and dense left sided hemiparesis. He was supposed to go to inpatient rehab in [place] but family refused at the last moment and wanted him to be transferred to [facility] for further evaluation. He uses CPAP at night and 3 L nasal oxygen during the day.  Eventually patient's family left AMA from outside facility and came to [facility].  At presentation to our emergency department at [facility] patient is hemodynamically stable. Pending CBC, CMP and chest  x-ray. Pending EKG.  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 Matthews recommended to start with a CT head without contrast and requested to get all the imaging from the outside facility.  Patient's wife at the whole package including the outside imaging recording in a CD.  I have informed patient's nurse to upload all the information into epic and neurology recommended once it has been done we have to reach them out for formal consult.  Per chart review of the paperwork Most recent head CT scan from 05/03/2024 showed:  There is a large cortical infraction in the right  region 4 mm at of midline shift from right to left unchanged.  Small petechial hemorrhage are present within the infraction but no frank hematoma detected.    Patient's discharge medication include Cymbalta 30 mg daily, aspirin 81 mg daily, Lipitor 40 mg daily, Prozac 20 mg daily, losartan 100 mg daily, hydrocodone, albuterol  nebulizer, Trelegy Ellipta, Lasix  40 mg daily, Lantus 10 unit at bedtime, short acting insulin 4 to 14 unit with meal and bedtime, melatonin, Protonix 40 mg daily, MiraLAX.  Significant labs in the ED: Lab Orders         CBC         Comprehensive metabolic panel         CBC with Differential         Hemoglobin A1c         Comprehensive metabolic panel         CBC  CBG monitoring, ED       Review of Systems:  Review of Systems  Constitutional:  Negative for chills, fever, malaise/fatigue and weight loss.  Respiratory:  Negative for cough, sputum production and shortness of breath.   Cardiovascular:  Negative for chest pain and palpitations.  Gastrointestinal:  Negative for abdominal pain, heartburn, nausea and vomiting.  Genitourinary:  Negative for dysuria.  Neurological:  Positive for focal  weakness. Negative for dizziness, tingling, tremors, sensory change, speech change, seizures, loss of consciousness, weakness and headaches.       Left sided upper and lower extremity weakness  Psychiatric/Behavioral:  The patient is not nervous/anxious.     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   Aortic root dilatation 08/27/2021   Aortic valve stenosis 10/14/2023   At risk for heart failure 01/29/2022   Bilateral carotid artery stenosis 08/12/2016   CAD (coronary artery disease) 01/11/2024   Candidal intertrigo 02/06/2022   Cellulitis of right leg 08/05/2021   Chronic diarrhea 05/26/2017   Chronic hypoxemic respiratory failure (HCC) 08/11/2023   Chronic obstructive lung disease (HCC) 08/27/2021   Chronic pain of both hips 12/29/2018   Chronic pain of both knees 07/11/2016   Chronic pain of both shoulders 12/29/2018   Chronic ulcer of right foot (HCC) 08/27/2021   Congestive heart failure (HCC) 07/06/2019   Continuous opioid dependence (HCC) 08/10/2023   Contracture of both Achilles tendons 02/01/2018   Essential hypertension 02/10/2012   Hardening of the aorta (main artery of the heart) 12/03/2021   Heart murmur 09/03/2023   History of pulmonary embolism 08/27/2021   Joint pain 08/27/2021   Microalbuminuria due to type 2 diabetes mellitus (HCC) 07/25/2022   Mixed hyperlipidemia 07/11/2016   Morbid obesity (HCC) 07/06/2019   Obstructive lung disease (HCC) 07/06/2019   Obstructive sleep apnea (adult) (pediatric) 05/26/2017   Onychomycosis due to dermatophyte 09/28/2018   Osteoarthritis of right knee 12/24/2021   Other specified personal risk factors, not elsewhere classified 12/03/2021   Pre-ulcerative calluses 02/01/2018   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 On coumadin for 3 months.     Last Assessment & Plan:   Relevant Hx:  Course:  Daily Update:  Today's Plan:     Shortness of breath 02/10/2012   Smoking addiction 01/11/2024   Statin not tolerated 12/31/2021   Tobacco abuse 07/06/2019   Active tobacco use 2 packs/day x 34 years     Type 2 diabetes mellitus (HCC) 02/10/2012   A. With retinopathy and neuropathy  B. Long-term use of insulin     Type 2 diabetes mellitus with diabetic neuropathy, with long-term current use of insulin (HCC) 07/11/2016   Ulcer of midfoot due to diabetes mellitus (HCC) 12/03/2021   Venous insufficiency of right leg 08/05/2021   Venous stasis 08/12/2016    History reviewed. No pertinent surgical history.   reports that he has been smoking cigarettes. He has never used smokeless tobacco. No history on file for alcohol use and drug use.  Allergies  Allergen Reactions   Heparin     Agitation    History reviewed. No pertinent family history.  Prior to Admission medications   Medication Sig Start Date End Date Taking? Authorizing Provider  albuterol  (VENTOLIN  HFA)  108 (90 Base) MCG/ACT inhaler Inhale into the lungs. 06/23/18   [provider]  aspirin 325 MG tablet Take by mouth.    [provider]  cyclobenzaprine (FLEXERIL) 5 MG tablet Take by mouth. Patient not taking: Reported on 01/11/2024    [provider]  DULoxetine (CYMBALTA) 30 MG capsule  06/15/19   [provider]  ezetimibe  (ZETIA ) 10 MG tablet Take 1 tablet (10 mg total) by mouth daily. 01/11/24   Madireddy, Alean SAUNDERS, MD  famotidine  (PEPCID ) 20 MG tablet Take 20 mg by mouth daily. 08/12/22   [provider]  furosemide  (LASIX ) 20 MG tablet Take 1 tablet (20 mg total) by mouth daily as needed for fluid or edema (take 20 mg if you have a weight gain of 2-3 pounds overnight or 5 pounds in one week). 01/11/24   Madireddy, Alean SAUNDERS, MD  gabapentin  (NEURONTIN) 300 MG capsule Take 300 mg by mouth at bedtime. 10/31/21   [provider]  Gabapentin, Once-Daily, 300 MG TABS Take by mouth.    [provider]  glucose blood (ONETOUCH ULTRA TEST) test strip 1 each by Other route as directed. 08/18/19   [provider]  HUMALOG 100 UNIT/ML injection INJECT SUBCUTANEOUSLY 20 UNITS BEFORE BREAKFAST AND LUNCH, AND 30 UNITS BEFORE SUPPER 03/14/19   [provider]  hydrochlorothiazide (HYDRODIURIL) 25 MG tablet TAKE 1 TABLET BY MOUTH  DAILY 06/15/19   [provider]  HYDROcodone-acetaminophen (NORCO) 7.5-325 MG tablet Take by mouth. 05/24/14   [provider]  insulin aspart (NOVOLOG) 100 UNIT/ML injection Inject 20 Units into the skin 3 (three) times daily. INJECT 20 UNIT SUBCUTANEOUSLY TWICE A DAY 20 UNITS TWO TIMES A DAY AND 30 UNITS ONCE AT NIGHT 10/12/20   [provider]  Insulin Glargine, 2 Unit Dial, (TOUJEO MAX SOLOSTAR) 300 UNIT/ML SOPN INJECT SUBCUTANEOUSLY 100  UNITS NIGHTLY. 06/15/19   [provider]  losartan (COZAAR) 100 MG tablet TAKE 1 TABLET BY MOUTH  DAILY 05/12/14   [provider]  melatonin 3 MG TABS tablet Take 6 mg by mouth at bedtime.    [provider]  metFORMIN (GLUCOPHAGE) 500 MG tablet TAKE 1 TABLET BY MOUTH  TWICE DAILY WITH FOOD 06/15/19   [provider]  Semaglutide, 2 MG/DOSE, (OZEMPIC, 2 MG/DOSE,) 8 MG/3ML SOPN Inject 2 mg into the skin once a week. 07/15/23   [provider]     Physical Exam: Vitals:   05/08/24 0000 05/08/24 0243 05/08/24 0330 05/08/24 0512  BP: (!) 95/56 127/66 124/61   Pulse:  83 82   Resp: 17 16 16    Temp:    98.6 F (37 C)  TempSrc:    Oral  SpO2: 98% 94% 99%   Weight:      Height:        Physical Exam Vitals and nursing note reviewed.  HENT:     Mouth/Throat:     Mouth: Mucous membranes are moist.  Eyes:     Pupils: Pupils are equal, round, and reactive to light.   Cardiovascular:     Rate and Rhythm: Normal rate and regular rhythm.     Pulses: Normal pulses.     Heart sounds: Normal heart sounds.  Pulmonary:     Effort: Pulmonary effort is normal.     Breath sounds: Normal breath sounds.  Abdominal:     Palpations: Abdomen is soft.  Musculoskeletal:     Cervical back: Neck supple.  Right lower leg: No edema.     Left lower leg: No edema.  Skin:    Capillary Refill: Capillary refill takes less than 2 seconds.  Neurological:     Mental Status: He is oriented to person, place, and time.     Cranial Nerves: Cranial nerve deficit present.     Sensory: No sensory deficit.     Motor: Weakness present.  Psychiatric:        Mood and Affect: Mood normal.      Labs on Admission: I have personally reviewed following labs and imaging studies  CBC: Recent Labs  Lab 05/07/24 2001 05/08/24 0512  WBC 10.1 9.7  NEUTROABS 7.1  --   HGB 16.1 14.8  HCT 47.4 43.4  MCV 90.8 90.4  PLT 284 271   Basic Metabolic Panel: Recent Labs  Lab 05/07/24 2001 05/08/24 0512  NA 137 137  K 4.5 3.6  CL 95* 100  CO2 29 26  GLUCOSE 164* 155*  BUN 21 21  CREATININE 0.98 0.85  CALCIUM 9.2 8.1*   GFR: Estimated Creatinine Clearance: 119.8 mL/min (by C-G formula based on SCr of 0.85 mg/dL). Liver Function Tests: Recent Labs  Lab 05/07/24 2001 05/08/24 0512  AST 40 32  ALT 38 31  ALKPHOS 55 48  BILITOT 2.2* 1.7*  PROT 7.0 5.8*  ALBUMIN 3.2* 2.6*   No results for input(s): LIPASE, AMYLASE in the last 168 hours. No results for input(s): AMMONIA in the last 168 hours. Coagulation Profile: No results for input(s): INR, PROTIME in the last 168 hours. Cardiac Enzymes: No results for input(s): CKTOTAL, CKMB, CKMBINDEX, TROPONINI, TROPONINIHS in the last 168 hours. BNP (last 3 results) No results for input(s): BNP in the last 8760 hours. HbA1C: Recent Labs    05/07/24 2001  HGBA1C 6.7*   CBG: Recent Labs  Lab  05/07/24 2143  GLUCAP 157*   Lipid Profile: No results for input(s): CHOL, HDL, LDLCALC, TRIG, CHOLHDL, LDLDIRECT in the last 72 hours. Thyroid Function Tests: No results for input(s): TSH, T4TOTAL, FREET4, T3FREE, THYROIDAB in the last 72 hours. Anemia Panel: No results for input(s): VITAMINB12, FOLATE, FERRITIN, TIBC, IRON, RETICCTPCT in the last 72 hours. Urine analysis: No results found for: COLORURINE, APPEARANCEUR, LABSPEC, PHURINE, GLUCOSEU, HGBUR, BILIRUBINUR, KETONESUR, PROTEINUR, UROBILINOGEN, NITRITE, LEUKOCYTESUR  Radiological Exams on Admission: I have personally reviewed images CT HEAD WO CONTRAST ( ) Result Date: 05/07/2024 EXAM: CT HEAD WITHOUT CONTRAST 05/07/2024 09:32:31 PM TECHNIQUE: CT of the head was performed without the administration of intravenous contrast. Automated exposure control, iterative reconstruction, and/or weight based adjustment of the mA/kV was utilized to reduce the radiation dose to as low as reasonably achievable. COMPARISON: Outside CT head May 05, 2024. CLINICAL HISTORY: History of acute CVA status post TNK afterward developed microhemorrhage on 9/28. FINDINGS: BRAIN AND VENTRICLES: In comparison to outside CT head from 05/05/2024 no substantial change in large right MCA territory infarct with petechial hemorrhage and overlying small volume of subarachnoid hemorrhage. Similar 4 mm leftward midline shift. No hydrocephalus. No extra-axial collection. No mass effect or midline shift. ORBITS: No acute abnormality. SINUSES: Right maxillary sinus retention cyst. SOFT TISSUES AND SKULL: No skull fracture. IMPRESSION: 1. In comparison to outside CT head from 05/05/2024, no substantial change in large right MCA territory infarct with petechial hemorrhage and overlying small volume of subarachnoid hemorrhage. 2. Similar 4 mm leftward midline shift. Electronically signed by: Gilmore Molt MD 05/07/2024 09:54  PM EDT RP Workstation: [id number]   CT OUTSIDE FILMS HEAD/FACE  Result Date: 05/07/2024 This examination belongs to an outside facility and is stored here for comparison purposes only.  Contact the originating outside institution for any associated report or interpretation.  DG Chest 1 View Result Date: 05/07/2024 CLINICAL DATA:  Hypoxia EXAM: CHEST  1 VIEW COMPARISON:  05/12/2005 FINDINGS: Heart and mediastinal contours are within normal limits. No focal opacities or effusions. No acute bony abnormality. Aortic atherosclerosis. IMPRESSION: No active disease. Electronically Signed   By: Franky Crease M.D.   On: 05/07/2024 20:09     EKG: My personal interpretation of EKG shows: Pending EKG  Assessment/Plan: Principal Problem:   Acute CVA (cerebrovascular accident) (HCC) 9/28 status post TNK Active Problems:   Intracranial microhemorrhage hemorrhage (HCC)   Dysphagia   Insulin dependent type 2 diabetes mellitus (HCC)   Essential hypertension   Hyperlipidemia   Acute pulmonary embolism (HCC)   Chronic hypoxic respiratory failure (HCC)   History of pulmonary embolism   Acute DVT (deep venous thrombosis) (HCC)   History of common carotid artery stent placement   History of CAD (coronary artery disease)   History of DVT (deep vein thrombosis)   Acute left hemiparesis (HCC)    Assessment and Plan: Recent acute CVA status post TNK 04/24/2024 Intracranial petechial microhemorrhage and small subarachnoid hemorrhage Recent CVA with left-sided hemiparesis Left-sided homonymous hemianopia Dysphagia secondary to CVA -Patient presenting from outside facility as the family wanted to get him treated over here at [facility].  He sustained an acute CVA status post TNK 9/28 afterward developed a small intracranial petechial microhemorrhage followed by found to have acute DVT and PE.  However neurology did not initiated DOAC and recommended to get repeat CT scan on Monday 9/13 before  initiating DOAC. - Upon presentation to in our system I have informed our on-call neurology Dr. Matthews who stated that neurology was unable to help unless we have get access to the previous imaging to figure out the next step.  Per neurology recommended to start with a CT head  without contrast and once the outside facility send us  all the imaging we have to consult neurology for formal recommendation. -Obtaining CT head without contrast. - At this point it is not safe reinitiating aspirin or not however I am holding the aspirin until receive the result of MRI and will need neurology involvement for further recommendation. -Continue Lipitor 40 mg daily -Patient also found to have dysphagia left-sided hemiparesis after CVA and family declined PEG tube however during my evaluation at the bedside patient's daughter reported that she wants any speech evaluation and decide about PEG tube over here at [facility].  Outside facility also able to arrange [facility] inpatient rehab however family declined that as well. -Checking basic lab work and chest x-ray. -Consulting speech therapy - Consulted PT and OT. -Obtaining echocardiogram. -Continue cardiac monitoring Update at 10 PM - CT head showing 1. In comparison to outside CT head from 05/05/2024, no substantial change in large right MCA territory infarct with petechial hemorrhage and overlying small volume of subarachnoid hemorrhage. 2. Similar 4 mm leftward midline shift. -Informed on-call neurology Dr. Vanessa reviewed the imaging and verified that the size of the petechial hemorrhage and subarachnoid hemorrhage which is small-volume and stable.  Neurology recommended it is safe to continue the aspirin 81 mg daily and also initiate pharmacological DVT prophylaxis with Lovenox.  And if the petechial hemorrhage/subarachnoid hemorrhage remained stable also can initiate IV heparin drip from tomorrow but neurology will do the formal consult  very soon.  Neurology  also recommended to hold off neurosurgery consult at this time.Resuming aspirin 81 mg daily and and after discussing with neurology initiating IV Lovenox daily for DVT prophylaxis.    Dysphagia from recent CVA Patient has dysphagia currently on mechanical soft diet.  Consulted to speech therapy and family is considering PEG tube placement and requested for the speech therapy evaluation in our facility.   History of DVT and PE Acute development of pulmonary embolism Acute development of DVT Per chart review patient has history of previous DVT PE 2023 was on warfarin for few months.  Afterward he was transition to aspirin 325 mg daily.  History of carotid artery stent placement History of CAD with cardiac stent placement -Currently aspirin on hold given obtaining MRI to rule out any abscess previous microhemorrhage. -Continue Lipitor 40 mg daily    Essential hypertension -Continue losartan 50 mg daily and Lasix  40 mg daily  Generalized anxiety disorder -Continue Cymbalta 30 mg daily and Prozac 20 mg daily  Chronic hypoxic respiratory failure History of COPD History of OSA on CPAP at bedtime -Stable.  Continue check pulse ox and supplemental oxygen 3 L at daytime and CPAP at bedtime - Continue DuoNeb as needed and encouraged Ellipta daily.   Insulin-dependent DM type II - Continue Lantus 10 units at bedtime and sliding scale insulin mealtime coverage.  DVT prophylaxis:  SCDs and TED hose.  Deferring pharmacological DVT prophylaxis in the setting of intracranial microhemorrhage. Update, after discussing with neurology and reviewing the brain imaging recommended it is safe to start pharmacological DVT prophylaxis as well as aspirin 81 mg daily. Code Status:  Full Code Diet: Dysphagia diet. Family Communication:   Family was present at bedside, at the time of interview. Opportunity was given to ask question and all questions were answered satisfactorily.  Disposition Plan:   Once  all the outside imaging has been uploaded on the PACS and new MRI will be resulted need to consult neurology again. Consults: PT, OT and speech therapy Admission status:   Inpatient, Step Down Unit  Severity of Illness: The appropriate patient status for this patient is INPATIENT. Inpatient status is judged to be reasonable and necessary in order to provide the required intensity of service to ensure the patient's safety. The patient's presenting symptoms, physical exam findings, and initial radiographic and laboratory data in the context of their chronic comorbidities is felt to place them at high risk for further clinical deterioration. Furthermore, it is not anticipated that the patient will be medically stable for discharge from the hospital within 2 midnights of admission.   * I certify that at the point of admission it is my clinical judgment that the patient will require inpatient hospital care spanning beyond 2 midnights from the point of admission due to high intensity of service, high risk for further deterioration and high frequency of surveillance required.DEWAINE    Amaurie Wandel, MD [facility]  How to contact the [facility] Attending or Consulting provider 7A - 7P or covering provider during after hours 7P -7A, for this patient.  Check the care team in [facility] and look for a) attending/consulting [facility] provider listed and b) the [facility] team listed Log into [email or web address] and use [facility]'s universal password to access. If you do not have the password, please contact the hospital operator. Locate the [facility] provider you are looking for under [facility] and page to a number that you can be directly reached. If you still have difficulty reaching the  provider, please page the [facility] (Director on Call) for the Hospitalists listed on amion for assistance.  05/08/2024, 6:23 AM

## 2024-05-08 ENCOUNTER — Inpatient Hospital Stay (HOSPITAL_COMMUNITY)

## 2024-05-08 ENCOUNTER — Encounter (HOSPITAL_COMMUNITY): Payer: Self-pay | Admitting: Internal Medicine

## 2024-05-08 ENCOUNTER — Other Ambulatory Visit: Payer: Self-pay

## 2024-05-08 ENCOUNTER — Observation Stay (HOSPITAL_COMMUNITY)

## 2024-05-08 DIAGNOSIS — R29710 NIHSS score 10: Secondary | ICD-10-CM

## 2024-05-08 DIAGNOSIS — R233 Spontaneous ecchymoses: Secondary | ICD-10-CM

## 2024-05-08 DIAGNOSIS — R1310 Dysphagia, unspecified: Secondary | ICD-10-CM | POA: Diagnosis not present

## 2024-05-08 DIAGNOSIS — G8194 Hemiplegia, unspecified affecting left nondominant side: Secondary | ICD-10-CM | POA: Diagnosis not present

## 2024-05-08 DIAGNOSIS — I63511 Cerebral infarction due to unspecified occlusion or stenosis of right middle cerebral artery: Secondary | ICD-10-CM | POA: Diagnosis not present

## 2024-05-08 DIAGNOSIS — J9611 Chronic respiratory failure with hypoxia: Secondary | ICD-10-CM | POA: Diagnosis not present

## 2024-05-08 DIAGNOSIS — I82439 Acute embolism and thrombosis of unspecified popliteal vein: Secondary | ICD-10-CM | POA: Diagnosis not present

## 2024-05-08 DIAGNOSIS — I2699 Other pulmonary embolism without acute cor pulmonale: Secondary | ICD-10-CM | POA: Diagnosis not present

## 2024-05-08 DIAGNOSIS — L89159 Pressure ulcer of sacral region, unspecified stage: Secondary | ICD-10-CM

## 2024-05-08 DIAGNOSIS — R432 Parageusia: Secondary | ICD-10-CM | POA: Diagnosis not present

## 2024-05-08 DIAGNOSIS — I629 Nontraumatic intracranial hemorrhage, unspecified: Secondary | ICD-10-CM | POA: Diagnosis not present

## 2024-05-08 DIAGNOSIS — I10 Essential (primary) hypertension: Secondary | ICD-10-CM | POA: Diagnosis not present

## 2024-05-08 DIAGNOSIS — I609 Nontraumatic subarachnoid hemorrhage, unspecified: Secondary | ICD-10-CM | POA: Diagnosis not present

## 2024-05-08 DIAGNOSIS — I639 Cerebral infarction, unspecified: Secondary | ICD-10-CM | POA: Diagnosis not present

## 2024-05-08 LAB — CBC
HCT: 43.4 % (ref 39.0–52.0)
Hemoglobin: 14.8 g/dL (ref 13.0–17.0)
MCH: 30.8 pg (ref 26.0–34.0)
MCHC: 34.1 g/dL (ref 30.0–36.0)
MCV: 90.4 fL (ref 80.0–100.0)
Platelets: 271 K/uL (ref 150–400)
RBC: 4.8 MIL/uL (ref 4.22–5.81)
RDW: 13.2 % (ref 11.5–15.5)
WBC: 9.7 K/uL (ref 4.0–10.5)
nRBC: 0 % (ref 0.0–0.2)

## 2024-05-08 LAB — COMPREHENSIVE METABOLIC PANEL WITH GFR
ALT: 31 U/L (ref 0–44)
AST: 32 U/L (ref 15–41)
Albumin: 2.6 g/dL — ABNORMAL LOW (ref 3.5–5.0)
Alkaline Phosphatase: 48 U/L (ref 38–126)
Anion gap: 11 (ref 5–15)
BUN: 21 mg/dL (ref 8–23)
CO2: 26 mmol/L (ref 22–32)
Calcium: 8.1 mg/dL — ABNORMAL LOW (ref 8.9–10.3)
Chloride: 100 mmol/L (ref 98–111)
Creatinine, Ser: 0.85 mg/dL (ref 0.61–1.24)
GFR, Estimated: >60 mL/min (ref >60)
Glucose, Bld: 155 mg/dL — ABNORMAL HIGH (ref 70–99)
Potassium: 3.6 mmol/L (ref 3.5–5.1)
Sodium: 137 mmol/L (ref 135–145)
Total Bilirubin: 1.7 mg/dL — ABNORMAL HIGH (ref 0.0–1.2)
Total Protein: 5.8 g/dL — ABNORMAL LOW (ref 6.5–8.1)

## 2024-05-08 LAB — LIPID PANEL
Cholesterol: 62 mg/dL (ref 0–200)
HDL: 22 mg/dL — ABNORMAL LOW (ref >40)
LDL Cholesterol: 25 mg/dL (ref 0–99)
Total CHOL/HDL Ratio: 2.8 ratio
Triglycerides: 74 mg/dL (ref <150)
VLDL: 15 mg/dL (ref 0–40)

## 2024-05-08 LAB — GLUCOSE, CAPILLARY
Glucose-Capillary: 162 mg/dL — ABNORMAL HIGH (ref 70–99)
Glucose-Capillary: 166 mg/dL — ABNORMAL HIGH (ref 70–99)
Glucose-Capillary: 145 mg/dL — ABNORMAL HIGH (ref 70–99)

## 2024-05-08 LAB — CBG MONITORING, ED
Glucose-Capillary: 165 mg/dL — ABNORMAL HIGH (ref 70–99)
Glucose-Capillary: 175 mg/dL — ABNORMAL HIGH (ref 70–99)

## 2024-05-08 MED ORDER — IOHEXOL 350 MG/ML SOLN
75.0000 mL | Freq: Once | INTRAVENOUS | Status: AC | PRN
  Administered 2024-05-08: 75 mL via INTRAVENOUS

## 2024-05-08 MED ORDER — AMANTADINE HCL 100 MG PO CAPS
200.0000 mg | ORAL_CAPSULE | Freq: Two times a day (BID) | ORAL | Status: DC
  Administered 2024-05-08 – 2024-05-10 (×4): 200 mg via ORAL
  Filled 2024-05-08 (×5): qty 2

## 2024-05-08 MED ORDER — DABIGATRAN ETEXILATE MESYLATE 150 MG PO CAPS
150.0000 mg | ORAL_CAPSULE | Freq: Two times a day (BID) | ORAL | Status: DC
  Administered 2024-05-08: 150 mg via ORAL
  Filled 2024-05-08 (×2): qty 1

## 2024-05-08 MED ORDER — LORAZEPAM 2 MG/ML IJ SOLN: 1.0000 mg | Freq: Once | INTRAMUSCULAR | Status: DC

## 2024-05-08 NOTE — Assessment & Plan Note (Addendum)
 05/08/24 noted while he was at [facility] center. He had MRI compatible and retrievable IVC placed on 05-04-2024. Neurology getting MRI brain to see if pt can be on systemic anticoagulation.

## 2024-05-08 NOTE — Assessment & Plan Note (Addendum)
 05/08/24 admitted to [facility] in [place], [place]. Had acute CVA. Received TNK. Pt had left sided paralysis. Wife did not see much improvement.  Hospitalist team did a good job managing him there. They arranged for him to get inpatient rehab bed with [facility] in [place]. Wife not happy at local hospital in [place] and removed pt AMA. Hired private ambulance to drive him to [facility] ER. All his acute issues were addressed at [facility].  Pt had MRI compatible IVC filter placed due to popliteal DVT and acute PE noted. Hospitalist team at [facility] felt that systemic anticoagulation was contraindicated at that time due to hemorrhage seen on MRI brain.  Neurology consulted here to address whether it is same for systemic anticoagulation. I have radiology tech uploading all his imaging studies onto our PACS system. Verified with CM that pt still has his inpatient rehab bed at [facility] and insurance authorization(valid until 05-11-2024). I see no acute medical issues that were not addressed by [facility] hospitalist. They did an excellent job managing this patient with multiple co-morbid conditions. He remains on ASA 81 mg daily, lipitor 40 mg daily. Goal is normotension at this point. Possible DC to inpatient rehab with [facility] in [place] tomorrow. Wife and patient live in [place].

## 2024-05-08 NOTE — Assessment & Plan Note (Addendum)
 05/08/24 on lipitor 40 mg qday

## 2024-05-08 NOTE — Assessment & Plan Note (Addendum)
 05/08/24 on ASA and lipitor.

## 2024-05-08 NOTE — ED Notes (Signed)
 Stage 2 pressure wound noted on sacrum, provider notified, LDA created, and photo uploaded to chart.

## 2024-05-08 NOTE — Consult Note (Signed)
 WOC Nurse Consult Note: Reason for Consult: sacral wound  Wound type: 1, Stage 2 Pressure Injury upper mid sacrum pink and moist 2.  Deep Tissue Pressure Injury L sacrum/buttock purple maroon discoloration  Pressure Injury POA: Yes Measurement: see nursing flowsheet  Wound bed: as above  Drainage (amount, consistency, odor) minimal serosanguinous noted on old dressing  Periwound: erythema  Dressing procedure/placement/frequency: Cleanse sacral and L buttock wound with NS, apply Xeroform gauze ([place] [phone]) to wound beds daily and secure with silicone foam.   POC discussed with bedside nurse Appreciate S. Applegate, RN assistance with this consult.  WOC team will not follow. Please reconsult for worsening/further needs.   Thank you,    Powell Bar MSN, RN-BC, [employer]

## 2024-05-08 NOTE — Assessment & Plan Note (Signed)
 05/08/24 Body mass index is 43.54 kg/m.  His dysgeusia will probably make him lose some weight. Which will be good for his overall health.

## 2024-05-08 NOTE — Assessment & Plan Note (Addendum)
 05/08/24 on lantus and SSI.

## 2024-05-08 NOTE — ED Notes (Signed)
 Neurology at bedside.

## 2024-05-08 NOTE — Progress Notes (Signed)
 PROGRESS NOTE    Darren Hunt  FMW:[id number] DOB: 1951-04-03 DOA: 05/07/2024 PCP: Nadine Columbus, FNP  Subjective: Pt seen and examined. Met with pt's wife at bedside. She, patient and grand-dtr were on vacation in [place] when he had a stroke. Pt fell off bed. Grand-dtr recognized left facial droop and left arm apraxia. Called 911. Pt taken to ER. Diagnosed with CVA. Pt given TNK. Did not have much improvement in CVA symptoms.  After nearly 2 weeks in hospital, [facility] team able to secure inpatient rehab bed at [facility] in [place]. Wife wanted 2nd opinion and removed pt AMA from facility. Pt showed up in [facility] ER.  Neurology consulted.   Hospital Course: CC: left AMA from [facility] in [place], [place]  HPI: Darren Hunt is a 73 y.o. male with medical history significant of bilateral carotid artery stent, CAD with cardiac stent, essential hypertension, insulin-dependent DM type II, hyperlipidemia, obstructive sleep apnea, chronic smoking cigarette, COPD, chronic hypoxic nocturnal respiratory failure on 3 L oxygen, aortic atherosclerosis, aortic root dilation, hyperlipidemia, history of DVT and PE 2023 who initially admitted to [facility], [place], [place]. Patient was transferred to [facility] as per family request.   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  Last head CT scan from 10/7 showed petechial hemorrhage with midline shift unchanged.    Later on, found to have left popliteal vein DVT and subsequent CTA chest showed PE but he can't be anticoagulated because of the brain bleed. Neurologist there has recommended to repeat a CT head on Monday and if stable findings, then DOACs can be initiated. He was supposed to get  PEG but he and his family refused. He has poor oral intake and dense left sided hemiparesis. He was supposed to go to inpatient rehab in [place] but family refused at the last moment and wanted him to be transferred to [facility] for further evaluation. He uses CPAP at night and 3 L nasal oxygen during the day.   Eventually patient's family left AMA from outside facility and came to [facility].   At presentation to our emergency department at [facility] patient is hemodynamically stable. Pending CBC, CMP and chest x-ray. Pending EKG.   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 Matthews recommended to start with a CT head without contrast and requested to get all the imaging from the outside facility.   Patient's wife at the whole package including the outside imaging recording in a CD.  I have informed patient's nurse to upload all the information into epic and neurology recommended once it has been done we have to reach them out for formal consult.   Per chart review of the paperwork Most recent head CT scan from 05/03/2024 showed:   There is a large cortical infraction in the right  region 4 mm at of midline shift from right to left unchanged.  Small petechial hemorrhage are present within the infraction but no frank hematoma detected.      Patient's discharge medication include Cymbalta 30 mg daily, aspirin 81 mg daily, Lipitor 40 mg daily, Prozac 20 mg daily, losartan 100  mg daily, hydrocodone, albuterol  nebulizer, Trelegy Ellipta, Lasix  40 mg daily, Lantus 10 unit at bedtime, short acting insulin 4 to 14 unit with meal and bedtime, melatonin, Protonix 40 mg daily, MiraLAX.  Significant Events: Admitted 05/07/2024 for acute CVA   Admission Labs: WBC 10.1, HgB 16.1, plt 284 A1c of  6.7% Na 137, K 4.5, CO2 of 29, BUN 21, Scr 0.98, glu 164 T. Prot 7.0, alb 3.2, AST 40, ALT 38, alk phos 55, t. Bili 2.2  Admission Imaging Studies: CXR No active disease.  CT head In comparison to outside CT head from 05/05/2024, no substantial change in large right MCA territory infarct with petechial hemorrhage and overlying small volume of subarachnoid hemorrhage. 2. Similar 4 mm leftward midline shift.  Significant Labs:   Significant Imaging Studies:   Antibiotic Therapy: Anti-infectives (From admission, onward)    None       Procedures:   Consultants: neurology    Assessment and Plan: * Acute CVA (cerebrovascular accident) (HCC) 9/28 status post TNK 05/08/24 admitted to [facility] in [place], [place]. Had acute CVA. Received TNK. Pt had left sided paralysis. Wife did not see much improvement.  Hospitalist team did a good job managing him there. They arranged for him to get inpatient rehab bed with [facility] in [place]. Wife not happy at local hospital in [place] and removed pt AMA. Hired private ambulance to drive him to [facility] ER. All his acute issues were addressed at [facility].  Pt had MRI compatible IVC filter placed due to popliteal DVT and acute PE noted. Hospitalist team at [facility] felt that systemic anticoagulation was contraindicated at that time due to hemorrhage seen on MRI brain.  Neurology consulted here to address whether it is same for systemic anticoagulation. I have radiology tech uploading all his imaging studies onto our PACS system. Verified with CM that pt still has his inpatient rehab bed at [facility] and insurance authorization(valid until 05-11-2024). I see no acute medical issues that were not addressed by [facility] hospitalist. They did an excellent job managing this patient with multiple co-morbid conditions. He remains on ASA 81 mg daily, lipitor 40 mg daily. Goal is normotension at this point. Possible DC to inpatient  rehab with [facility] in [facility] tomorrow. Wife and patient live in [place].   Dysphagia 05/08/24 pt discharged from [facility] on a mechanical soft diet.   Intracranial microhemorrhage hemorrhage (HCC) 05/08/24 neurology consulted. MRI brain pending. Neurology to address whether it is safe for pt to be systemically anticoagulated for his DVT/PE.   Dysgeusia 05/08/24 discussed with wife that his abnormal taste sensation is due to his CVA. Pt does have the ability to swallow. He just doesn't like the taste of certain foods. Discussed that it will be a trial and error of tasting different foods to find ones that pt will like to eat. She states that pt already doesn't like the taste of sweets, which he used to prior to his CVA.   Acute left hemiparesis (HCC) 05/08/24 due to his large right MCA CVA. Going to inpatient rehab at [facility] in [place].  Acute DVT (deep venous thrombosis) (HCC) 05/08/24 was noted at [facility]. He had popliteal DVT.  Had IVC filter placed on 05-04-2024.  Acute pulmonary embolism (HCC) 05/08/24 noted while he was at [facility] center. He had MRI compatible and retrievable IVC placed on 05-04-2024. Neurology getting MRI brain to see if pt can be on systemic anticoagulation.  Pressure injury of skin of sacral region 05/08/24 .  Present on admission. Wound 05/08/24 1009 Pressure Injury Coccyx Stage 2 -  Partial thickness loss of dermis presenting as a shallow open injury with a red, pink wound bed without slough. (Active)       History of CAD (coronary artery disease) 05/08/24 on ASA and lipitor.   Chronic hypoxic respiratory failure (HCC) 05/08/24 requiring 2-3 L/min O2.  Morbid obesity with BMI of 40.0-44.9, adult (HCC) 05/08/24 Body mass index is 43.54 kg/m.  His dysgeusia will probably make him lose some weight. Which will be good for his overall health.   Hyperlipidemia 05/08/24 on lipitor 40 mg qday  Essential  hypertension 05/08/24 on cozaar 50 mg every day, lasix  40 mg every day,   Insulin dependent type 2 diabetes mellitus (HCC) 05/08/24 on lantus and SSI.  DVT prophylaxis: Place and maintain sequential compression device Start: 05/08/24 0624 Place TED hose Start: 05/08/24 0624 SCDs Start: 05/07/24 2005 Place TED hose Start: 05/07/24 2005    Code Status: Full Code Family Communication: discussed with pt's wife [name] at bedside. Pt's pastor also at bedside Disposition Plan: inpatient Rehab with [facility] Reason for continuing need for hospitalization: awaiting neurology to finish workup.  Objective: Vitals:   05/08/24 1015 05/08/24 1045 05/08/24 1159 05/08/24 1500  BP:   (!) 146/63 (!) 111/59  Pulse: 71 74 89 75  Resp:  12 18 16   Temp:      TempSrc:      SpO2: 96% 96% 97% 95%  Weight:      Height:       No intake or output data in the 24 hours ending 05/08/24 1645 Filed Weights   05/07/24 1904  Weight: (!) 149.7 kg    Examination:  Physical Exam Vitals and nursing note reviewed.  Constitutional:      General: He is not in acute distress.    Appearance: He is obese. He is not toxic-appearing.     Comments: Awake, dysarthric speech.  Eyes:     General: No scleral icterus. Cardiovascular:     Rate and Rhythm: Normal rate and regular rhythm.  Pulmonary:     Effort: Pulmonary effort is normal. No respiratory distress.     Breath sounds: Normal breath sounds.  Abdominal:     General: Abdomen is protuberant. Bowel sounds are normal.     Palpations: Abdomen is soft.  Skin:    General: Skin is warm and dry.     Capillary Refill: Capillary refill takes less than 2 seconds.  Neurological:     Comments: Left side paralysis  Able to use his right UE/hand to drink water from a cup with straw. No evidence of choking with drinking water.     Data Reviewed: I have personally reviewed following labs and imaging studies  CBC: Recent Labs  Lab 05/07/24 2001 05/08/24 0512   WBC 10.1 9.7  NEUTROABS 7.1  --   HGB 16.1 14.8  HCT 47.4 43.4  MCV 90.8 90.4  PLT 284 271   Basic Metabolic Panel: Recent Labs  Lab 05/07/24 2001 05/08/24 0512  NA 137 137  K 4.5 3.6  CL 95* 100  CO2 29 26  GLUCOSE 164* 155*  BUN 21 21  CREATININE 0.98 0.85  CALCIUM 9.2 8.1*   GFR: Estimated Creatinine Clearance: 119.8 mL/min (by C-G formula based on SCr of 0.85 mg/dL). Liver Function Tests: Recent Labs  Lab 05/07/24 2001 05/08/24 0512  AST 40 32  ALT 38 31  ALKPHOS 55 48  BILITOT 2.2* 1.7*  PROT 7.0 5.8*  ALBUMIN 3.2* 2.6*   HbA1C: Recent Labs    05/07/24 2001  HGBA1C 6.7*   CBG: Recent Labs  Lab 05/07/24 2143 05/08/24 0809 05/08/24 1230  GLUCAP 157* 165* 175*   Lipid Profile: Recent Labs    05/08/24 0512  CHOL 62  HDL 22*  LDLCALC 25  TRIG 74  CHOLHDL 2.8   Radiology Studies: CT ANGIO HEAD NECK W WO CM Result Date: 05/08/2024 EXAM: CTA Head and Neck with Intravenous Contrast. CT Head without Contrast. CLINICAL HISTORY: 73 year old male with stroke/TIA, determine embolic source. Stroke on 09/28. Transfer from outside facility. TECHNIQUE: Axial CTA images of the head and neck performed with intravenous contrast. MIP reconstructed images were created and reviewed. Axial computed tomography images of the head/brain performed without intravenous contrast. Note: Per PQRS, the description of internal carotid artery percent stenosis, including 0 percent or normal exam, is based on [place] American Symptomatic Carotid Endarterectomy Trial (NASCET) criteria. Dose reduction technique was used including one or more of the following: automated exposure control, adjustment of mA and kV according to patient size, and/or iterative reconstruction. CONTRAST: Without and with IV contrast. 75 mL iohexol (OMNIPAQUE) 350 MG/ML injection. COMPARISON: Head CT 05/07/2024. FINDINGS: CT HEAD: BRAIN: Confluent hypodense edema in the right hemisphere, right MCA territory with  scattered subcentimeter petechial hyperdensity suspicious for petechial hemorrhage (such as series 6 image 20). Mass effect on the right lateral ventricle with only trace leftward midline shift. There are areas of cortical involvement, although some of the edema pattern is reminiscent of vasogenic edema. Stable gray and white matter differentiation since yesterday. Basilar cisterns remain patent. No new or progressive intracranial hemorrhage. No gaze deviation. No extra-axial collection. VENTRICLES: Mass effect on the right lateral ventricle with only trace leftward midline shift. No hydrocephalus. ORBITS: The orbits are unremarkable. SINUSES AND MASTOIDS: The paranasal sinuses and mastoid air cells are clear. CTA NECK: COMMON CAROTID ARTERIES: Widespread atherosclerosis of the right CCA and proximal right ICA but no hemodynamically significant stenosis in the neck. Left CCA atherosclerosis without stenosis. No dissection or occlusion. INTERNAL CAROTID ARTERIES: Widespread atherosclerosis of the right CCA and proximal right ICA but no hemodynamically significant stenosis in the neck. Right ICA siphon is patent with moderate calcified atherosclerosis and moderate cavernous segment stenosis. Patent vascular stent of the distal left CCA continuing through the left ICA to the C1-C2 vertebral level. No high grade incident stenosis suspected by CTA. Left ICA siphon is patent with moderate calcified atherosclerosis and moderate stenosis of the left anterior genu. No dissection or occlusion. VERTEBRAL ARTERIES: Calcified atherosclerosis of the proximal right subclavian artery also affects the right vertebral artery origin with moderate stenosis. Right vertebral artery is then patent to the vertebrobasilar junction without additional stenosis. Proximal left subclavian atherosclerosis also affects the left vertebral artery origin which is moderately to severely stenotic on series 13 image 194. Codominant left vertebral artery  then is patent with no additional stenosis to the vertebrobasilar junction. No dissection or occlusion. CTA HEAD: ANTERIOR CEREBRAL ARTERIES: Dominant right A1 segment. Normal anterior communicating artery. Dominant right ACA throughout. ACA branches are within normal limits. No significant stenosis. No occlusion. No aneurysm. MIDDLE CEREBRAL ARTERIES: Left MCA bifurcates early without stenosis. Left MCA branches are within normal limits. Right MCA M1 segment and bifurcation are patent. No M1 stenosis. But proximal MCA M2 branch irregularity and stenosis is up to moderate on series 17 image 15. No discrete branch occlusion is identified. Right MCA branches  are generally attenuated compared to the left side (series 15 image 18). No aneurysm. POSTERIOR CEREBRAL ARTERIES: Patent PCA origins. Bilateral PCA branches are patent without stenosis. No significant stenosis. No occlusion. No aneurysm. BASILAR ARTERY: Patent basilar artery without stenosis. No significant stenosis. No occlusion. No aneurysm. OTHER: Superior sagittal sinus is enhancing and patent. Both PICA origins are patent. Patent SCA origins. Posterior communicating arteries are diminutive or absent. Aortic arch atherosclerosis. 3 vessel arch configuration. Proximal great vessel atherosclerosis. SOFT TISSUES: Negative visible upper chest. Negative visible nonvascular neck soft tissue spaces. No masses or lymphadenopathy. BONES: Widespread advanced disc and endplate degeneration in the visible spine. No acute osseous abnormality. IMPRESSION: 1. Negative for large vessel occlusion but positive for branches stenoses at the Right bifurcation and generally attenuated RMCA branches compared to the left. 2. Widespread atherosclerosis in the head and neck. Patent Left Carotid vascular stent. Notable stenoses: Left vertebral artery origin (Moderate to Severe), Both ICA siphons (Moderate). Right vertebral artery origin (Moderate). 3. Right MCA territory confluent  edema with scattered petechial hemorrhage and mild mass effect stable from Head CT yesterday. Trace leftward midline shift. No malignant hemorrhagic transformation or new intracranial abnormality. Electronically signed by: Helayne Hurst MD 05/08/2024 09:34 AM EDT RP Workstation: [id number]   CT HEAD WO CONTRAST ( ) Result Date: 05/07/2024 EXAM: CT HEAD WITHOUT CONTRAST 05/07/2024 09:32:31 PM TECHNIQUE: CT of the head was performed without the administration of intravenous contrast. Automated exposure control, iterative reconstruction, and/or weight based adjustment of the mA/kV was utilized to reduce the radiation dose to as low as reasonably achievable. COMPARISON: Outside CT head May 05, 2024. CLINICAL HISTORY: History of acute CVA status post TNK afterward developed microhemorrhage on 9/28. FINDINGS: BRAIN AND VENTRICLES: In comparison to outside CT head from 05/05/2024 no substantial change in large right MCA territory infarct with petechial hemorrhage and overlying small volume of subarachnoid hemorrhage. Similar 4 mm leftward midline shift. No hydrocephalus. No extra-axial collection. No mass effect or midline shift. ORBITS: No acute abnormality. SINUSES: Right maxillary sinus retention cyst. SOFT TISSUES AND SKULL: No skull fracture. IMPRESSION: 1. In comparison to outside CT head from 05/05/2024, no substantial change in large right MCA territory infarct with petechial hemorrhage and overlying small volume of subarachnoid hemorrhage. 2. Similar 4 mm leftward midline shift. Electronically signed by: Gilmore Molt MD 05/07/2024 09:54 PM EDT RP Workstation: [id number]   CT OUTSIDE FILMS HEAD/FACE Result Date: 05/07/2024 This examination belongs to an outside facility and is stored here for comparison purposes only.  Contact the originating outside institution for any associated report or interpretation.  DG Chest 1 View Result Date: 05/07/2024 CLINICAL DATA:  Hypoxia EXAM: CHEST  1 VIEW COMPARISON:   05/12/2005 FINDINGS: Heart and mediastinal contours are within normal limits. No focal opacities or effusions. No acute bony abnormality. Aortic atherosclerosis. IMPRESSION: No active disease. Electronically Signed   By: Franky Crease M.D.   On: 05/07/2024 20:09    Scheduled Meds:  aspirin EC  81 mg Oral Daily   atorvastatin  40 mg Oral Daily   DULoxetine  30 mg Oral Daily   enoxaparin (LOVENOX) injection  75 mg Subcutaneous Q24H   FLUoxetine  20 mg Oral Daily   furosemide   40 mg Oral Daily   insulin aspart  0-5 Units Subcutaneous QHS   insulin aspart  0-6 Units Subcutaneous TID WC   insulin glargine  10 Units Subcutaneous Q2200   LORazepam  1 mg Intravenous Once   losartan  50 mg Oral Daily  pantoprazole  40 mg Oral Daily   polyethylene glycol  17 g Oral Daily   sodium chloride  flush  3 mL Intravenous Q12H   sodium chloride  flush  3 mL Intravenous Q12H   umeclidinium bromide  1 puff Inhalation Daily   Continuous Infusions:  sodium chloride        LOS: 1 day   Time spent: 80 minutes  Camellia Door, DO  [facility]  05/08/2024, 4:45 PM

## 2024-05-08 NOTE — Subjective & Objective (Signed)
 Pt seen and examined. Met with pt's wife at bedside. She, patient and grand-dtr were on vacation in [place] when he had a stroke. Pt fell off bed. Grand-dtr recognized left facial droop and left arm apraxia. Called 911. Pt taken to ER. Diagnosed with CVA. Pt given TNK. Did not have much improvement in CVA symptoms.  After nearly 2 weeks in hospital, [facility] team able to secure inpatient rehab bed at [facility] in [place]. Wife wanted 2nd opinion and removed pt AMA from facility. Pt showed up in [facility] ER.  Neurology consulted.

## 2024-05-08 NOTE — Assessment & Plan Note (Addendum)
 05/08/24 due to his large right MCA CVA. Going to inpatient rehab at [facility] in [place].

## 2024-05-08 NOTE — Assessment & Plan Note (Signed)
 05/08/24 pt discharged from [facility] on a mechanical soft diet.

## 2024-05-08 NOTE — Assessment & Plan Note (Addendum)
 05/08/24 requiring 2-3 L/min O2.

## 2024-05-08 NOTE — Assessment & Plan Note (Addendum)
 05/08/24 on cozaar 50 mg every day, lasix  40 mg every day,

## 2024-05-08 NOTE — Assessment & Plan Note (Addendum)
 05/08/24 discussed with wife that his abnormal taste sensation is due to his CVA. Pt does have the ability to swallow. He just doesn't like the taste of certain foods. Discussed that it will be a trial and error of tasting different foods to find ones that pt will like to eat. She states that pt already doesn't like the taste of sweets, which he used to prior to his CVA.

## 2024-05-08 NOTE — Care Management CC44 (Signed)
 Condition Code 44 Documentation Completed  Patient Details  Name: Darren Hunt MRN: [id number] Date of Birth: 05-Dec-1950   Condition Code 44 given:  Yes Patient signature on Condition Code 44 notice:  Yes Documentation of 2 MD's agreement:  Yes Code 44 added to claim:  Yes    Corean JAYSON Canary, RN 05/08/2024, 2:32 PM

## 2024-05-08 NOTE — Assessment & Plan Note (Signed)
 05/08/24 neurology consulted. MRI brain pending. Neurology to address whether it is safe for pt to be systemically anticoagulated for his DVT/PE.

## 2024-05-08 NOTE — Progress Notes (Signed)
 PT Cancellation Note  Patient Details Name: TEVIS DUNAVAN MRN: [id number] DOB: 07-Dec-1950   Cancelled Treatment:    Reason Eval/Treat Not Completed: Fatigue/lethargy limiting ability to participate;Patient at procedure or test/unavailable (PT consult appreciated and chart reviewed. Entered pt's room and explained PT's role and purpose of evaluation. He would intermittently drift off to sleep and required tactile stimulation to maintain alertness. Transport arrived soon after PT entered room and attempted to acquire PLOF and home set-up stating they were here to take pt to CT. Will follow-up for PT evaluation as schedule permits.)  Randall JONELLE, PT, DPT Acute Rehabilitation Services Office: [phone] Secure Chat Preferred  Delon CHRISTELLA Callander 05/08/2024, 8:43 AM

## 2024-05-08 NOTE — Assessment & Plan Note (Addendum)
 05/08/24 was noted at [facility]. He had popliteal DVT.  Had IVC filter placed on 05-04-2024.

## 2024-05-08 NOTE — ED Notes (Signed)
 Patient transported to CT

## 2024-05-08 NOTE — Evaluation (Signed)
 Physical Therapy Evaluation Patient Details Name: Darren Hunt MRN: [id number] DOB: November 04, 1950 Today's Date: 05/08/2024  History of Present Illness  Darren Hunt is a 73 y.o. male who presented to [facility] ED 05/07/24 with R posterior MCA CVA with 4mm midline shift and petechial hemorrhage with small overlying subarachnoid hemorrhage. NIHSS 10. He was vacationing in [place], [place] when the stroke occurred (9/28). Pt received TNK. Found to have L popliteal vein DVT and PE. Family hired ambulance to transport pt back closer to home. PMHx: CAD, HTN, T2DM, HLD, OSA, morbid obesity, COPD on 3L O2 at baseline, hx of DVTs and PEs.   Clinical Impression  Pt admitted with above diagnosis. Pt was previously independent with functional mobility, ADLs/IADLs, driving, and retired. He lives with wife in a three story house with 3 STE. Pt is able to reside on the main level and wife reports it is handicap accessible. Pt currently with functional limitations due to the deficits listed below (see PT Problem List). He required mod-maxA x2 for bed mobility. Pt demonstrated a posterior and left lateral lean requiring modA to stabilize. He is currently limited by pain, decreased LLE sensation/coordination, LLE hemiplegia, impaired balance, and decreased activity tolerance. Pt will benefit from acute skilled PT to increase his independence and safety with mobility to allow discharge. Recommend intensive inpatient follow-up therapy, >3 hours/day.    If plan is discharge home, recommend the following: Two people to help with walking and/or transfers;Two people to help with bathing/dressing/bathroom;Assistance with cooking/housework;Assist for transportation;Help with stairs or ramp for entrance   Can travel by private vehicle        Equipment Recommendations Hospital bed;Hoyer lift;Wheelchair (measurements PT);Wheelchair cushion (measurements PT)  Recommendations for Other Services  Rehab consult    Functional Status  Assessment Patient has had a recent decline in their functional status and demonstrates the ability to make significant improvements in function in a reasonable and predictable amount of time.     Precautions / Restrictions Precautions Precautions: Fall Recall of Precautions/Restrictions: Impaired Restrictions Weight Bearing Restrictions Per Provider Order: No      Mobility  Bed Mobility Overal bed mobility: Needs Assistance Bed Mobility: Rolling, Sidelying to Sit, Sit to Sidelying Rolling: Max assist Sidelying to sit: Mod assist, +2 for physical assistance, HOB elevated     Sit to sidelying: Max assist, +2 for physical assistance General bed mobility comments: Pt sat up on R side of stretcher with increased time. He brought RLE off EOB. Assist to manage LHB. Use of bed pad to faciliate sidelying. +2 assist to elevate trunk and pivot hips so they were square. Returning to bed assist to manage trunk and BLE. Repositioned using bed features.    Transfers                        Ambulation/Gait                  Stairs            Wheelchair Mobility     Tilt Bed    Modified Rankin (Stroke Patients Only) Modified Rankin (Stroke Patients Only) Pre-Morbid Rankin Score: No symptoms Modified Rankin: Severe disability     Balance Overall balance assessment: Needs assistance Sitting-balance support: Single extremity supported, Feet supported Sitting balance-Leahy Scale: Poor Sitting balance - Comments: Pt sat EOB with modA to maintain upright posture. Postural control: Left lateral lean, Posterior lean  Pertinent Vitals/Pain Pain Assessment Pain Assessment: 0-10 Pain Score: 8  Pain Location: Back and R buttocks Pain Descriptors / Indicators: Grimacing, Discomfort, Aching Pain Intervention(s): Monitored during session, Limited activity within patient's tolerance, Repositioned, Patient requesting pain  meds-RN notified    Home Living Family/patient expects to be discharged to:: Inpatient rehab Living Arrangements: Spouse/significant other Available Help at Discharge: Family;Available 24 hours/day (Wife 24/7, unsure the level of assist she could provide; Daughter and Son-in-Law planning to come stay with them and assist.) Type of Home: House Home Access: Stairs to enter Entrance Stairs-Rails: None Entrance Stairs-Number of Steps: 3   Home Layout: Multi-level;Able to live on main level with bedroom/bathroom;Full bath on main level Home Equipment: Cane - single point;Standard Walker;Wheelchair - power;Grab bars - tub/shower;Shower seat - built in;Hand held shower head      Prior Function Prior Level of Function : Independent/Modified Independent;Driving             Mobility Comments: Ambulates without AD. 2 falls while on vacation in [place]. ADLs Comments: Indep with ADLs/IADLs. Driving. Retired.     Extremity/Trunk Assessment   Upper Extremity Assessment Upper Extremity Assessment: Defer to OT evaluation    Lower Extremity Assessment Lower Extremity Assessment: LLE deficits/detail LLE Deficits / Details: Pt with decreased AROM likely d/t weakness. PROM WFL. Decreased strength: hip, knee, and ankle grossly 2/5. LLE Sensation: decreased light touch;decreased proprioception LLE Coordination: decreased gross motor    Cervical / Trunk Assessment Cervical / Trunk Assessment: Other exceptions Cervical / Trunk Exceptions: Increased Body Habitus  Communication   Communication Communication: No apparent difficulties    Cognition Arousal: Alert Behavior During Therapy: WFL for tasks assessed/performed   PT - Cognitive impairments: No apparent impairments                       PT - Cognition Comments: Pt A,Ox4 Following commands: Intact       Cueing Cueing Techniques: Verbal cues, Gestural cues     General Comments General comments (skin integrity, edema, etc.):  VSS on RA. LUE edema.    Exercises     Assessment/Plan    PT Assessment Patient needs continued PT services  PT Problem List Decreased strength;Decreased activity tolerance;Decreased balance;Decreased mobility;Decreased knowledge of precautions;Impaired sensation       PT Treatment Interventions DME instruction;Gait training;Stair training;Functional mobility training;Therapeutic activities;Therapeutic exercise;Balance training;Patient/family education;Cognitive remediation;Wheelchair mobility training    PT Goals (Current goals can be found in the Care Plan section)  Acute Rehab PT Goals Patient Stated Goal: Regain independence PT Goal Formulation: With patient/family Time For Goal Achievement: 05/22/24 Potential to Achieve Goals: Fair    Frequency Min 3X/week     Co-evaluation               AM-PAC PT 6 Clicks Mobility  Outcome Measure Help needed turning from your back to your side while in a flat bed without using bedrails?: A Lot Help needed moving from lying on your back to sitting on the side of a flat bed without using bedrails?: Total Help needed moving to and from a bed to a chair (including a wheelchair)?: Total Help needed standing up from a chair using your arms (e.g., wheelchair or bedside chair)?: Total Help needed to walk in hospital room?: Total Help needed climbing 3-5 steps with a railing? : Total 6 Click Score: 7    End of Session   Activity Tolerance: Patient tolerated treatment well Patient left: in bed;with call bell/phone within reach;with  family/visitor present Nurse Communication: Mobility status;Need for lift equipment PT Visit Diagnosis: Hemiplegia and hemiparesis;Other symptoms and signs involving the nervous system (R29.898);Difficulty in walking, not elsewhere classified (R26.2);Other abnormalities of gait and mobility (R26.89);Unsteadiness on feet (R26.81) Hemiplegia - Right/Left: Left Hemiplegia - dominant/non-dominant:  Non-dominant Hemiplegia - caused by: Cerebral infarction    Time: 8580-8554 PT Time Calculation (min) (ACUTE ONLY): 26 min   Charges:   PT Evaluation $PT Eval Moderate Complexity: 1 Mod   PT General Charges $$ ACUTE PT VISIT: 1 Visit         Randall SAUNDERS, PT, DPT Acute Rehabilitation Services Office: [phone] Secure Chat Preferred  Delon CHRISTELLA Callander 05/08/2024, 4:16 PM

## 2024-05-08 NOTE — Care Management Obs Status (Signed)
 MEDICARE OBSERVATION STATUS NOTIFICATION   Patient Details  Name: Darren Hunt MRN: [id number] Date of Birth: [birth year]   Medicare Observation Status Notification Given:  Yes    Corean JAYSON Canary, RN 05/08/2024, 2:32 PM

## 2024-05-08 NOTE — ED Notes (Deleted)
 Pt wanting heartburn medicine, RN gave scheduled Protonix. Pt refusing all other ordered meds.

## 2024-05-08 NOTE — Progress Notes (Addendum)
 STROKE TEAM PROGRESS NOTE    SIGNIFICANT HOSPITAL EVENTS   10/11: Arrived to [facility] via private ambulance from [place] after having stroke imaging and workup detailed below, imaging also noted left popliteal DVT along with a small volume PE in the right lower lobe on the CT of the chest. IVC filter placed 10/8. Patient  developed having sudden onset left-sided weakness and left vision loss and was taken to outside hospital where he received thrombolytics. Post TNK, noted to have developed a R posterior MCA stroke with petechial hemorrhage and overlying smal SAH.  CT head tonight with right Posterior MCA distribution infarct with 4 mm midline shift that appears to be stable compared to [facility] from outside facility that was obtained on 05/05/2024.  CT head also demonstrates petechial hemorrhage with small overlying subarachnoid hemorrhage.  Wife did bring images on disk, but these are not viewable. Pending getting these uploaded.   INTERIM HISTORY/SUBJECTIVE  Wife at bedside.  Patient sleepy but able to answer and orientation questions, no confusion.  Left facial droop left arm and leg weakness, decreased sensation on left.  Confirmed with wife that initial stroke occurred 9/28, patient was given TNK, 24-hour post TNK imaging showed petechial hemorrhages and subarachnoid hemorrhage.  IVC filter was placed.  Wife was concerned to there because they only saw neurologist via computer 1 time during entire stay there and they hired private ambulance to bring him back to [place].  They are agreeable to start Pradaxa if bleeding has stabilized and he is able to start anticoagulation, pending repeat MRI.  Patient has IVC filter but this should be MRI compatible up to certain field strength.  OBJECTIVE  CBC    Component Value Date/Time   WBC 9.7 05/08/2024 0512   RBC 4.80 05/08/2024 0512   HGB 14.8 05/08/2024 0512   HCT 43.4 05/08/2024 0512   PLT 271 05/08/2024 0512   MCV 90.4 05/08/2024 0512   MCH 30.8  05/08/2024 0512   MCHC 34.1 05/08/2024 0512   RDW 13.2 05/08/2024 0512   LYMPHSABS 1.9 05/07/2024 2001   MONOABS 0.8 05/07/2024 2001   EOSABS 0.2 05/07/2024 2001   BASOSABS 0.1 05/07/2024 2001    BMET    Component Value Date/Time   NA 137 05/08/2024 0512   K 3.6 05/08/2024 0512   CL 100 05/08/2024 0512   CO2 26 05/08/2024 0512   GLUCOSE 155 (H) 05/08/2024 0512   BUN 21 05/08/2024 0512   CREATININE 0.85 05/08/2024 0512   CALCIUM 8.1 (L) 05/08/2024 0512   GFRNONAA >60 05/08/2024 0512    IMAGING past 24 hours CT HEAD WO CONTRAST ( ) Result Date: 05/07/2024 EXAM: CT HEAD WITHOUT CONTRAST 05/07/2024 09:32:31 PM TECHNIQUE: CT of the head was performed without the administration of intravenous contrast. Automated exposure control, iterative reconstruction, and/or weight based adjustment of the mA/kV was utilized to reduce the radiation dose to as low as reasonably achievable. COMPARISON: Outside CT head May 05, 2024. CLINICAL HISTORY: History of acute CVA status post TNK afterward developed microhemorrhage on 9/28. FINDINGS: BRAIN AND VENTRICLES: In comparison to outside CT head from 05/05/2024 no substantial change in large right MCA territory infarct with petechial hemorrhage and overlying small volume of subarachnoid hemorrhage. Similar 4 mm leftward midline shift. No hydrocephalus. No extra-axial collection. No mass effect or midline shift. ORBITS: No acute abnormality. SINUSES: Right maxillary sinus retention cyst. SOFT TISSUES AND SKULL: No skull fracture. IMPRESSION: 1. In comparison to outside CT head from 05/05/2024, no substantial change in large  right MCA territory infarct with petechial hemorrhage and overlying small volume of subarachnoid hemorrhage. 2. Similar 4 mm leftward midline shift. Electronically signed by: Gilmore Molt MD 05/07/2024 09:54 PM EDT RP Workstation: [id number]   CT OUTSIDE FILMS HEAD/FACE Result Date: 05/07/2024 This examination belongs to an outside  facility and is stored here for comparison purposes only.  Contact the originating outside institution for any associated report or interpretation.  DG Chest 1 View Result Date: 05/07/2024 CLINICAL DATA:  Hypoxia EXAM: CHEST  1 VIEW COMPARISON:  05/12/2005 FINDINGS: Heart and mediastinal contours are within normal limits. No focal opacities or effusions. No acute bony abnormality. Aortic atherosclerosis. IMPRESSION: No active disease. Electronically Signed   By: Franky Crease M.D.   On: 05/07/2024 20:09    Vitals:   05/08/24 0330 05/08/24 0512 05/08/24 0630 05/08/24 0700  BP: 124/61  (!) 115/54 (!) 119/58  Pulse: 82  70 71  Resp: 16  16 15   Temp:  98.6 F (37 C)    TempSrc:  Oral    SpO2: 99%  (!) 88% 96%  Weight:      Height:         PHYSICAL EXAM General:  Alert, well-nourished, well-developed patient in no acute distress Psych:  Mood and affect appropriate for situation CV: Regular rate and rhythm on monitor Respiratory:  Regular, unlabored respirations on room air GI: Abdomen soft and nontender   NEURO:  Mental Status: Drowsy, awakens to voice.  Awake alert oriented x 3. Speech/Language: speech is without dysarthria or aphasia.  Naming, repetition, fluency, and comprehension intact--paucity of speech.  Follows simple commands.  Cranial Nerves:  II: PERRL. Visual fields full.  III, IV, VI: Right gaze preference but does cross midline unable to have complete and held left gaze V: Sensation is intact to light touch and symmetrical to face.  VII: Left facial droop VIII: hearing intact to voice. IX, X: Palate elevates symmetrically. Phonation is normal.  KP:Dynloizm shrug 5/5. XII: tongue is midline without fasciculations. Motor:  LUE: 3/5, 2/5 grip. Immediate drift.  LLE: 3/5, 4+/5 plantar foot flexion. Immediate drift.  Tone: is normal and bulk is normal Sensation-decreased on left Coordination: FTN intact on right, unable to perform on left. Gait- deferred  Most  Recent NIH 8     ASSESSMENT/PLAN  [name] is a 73 y.o. male with history of HTN, DM2, HLD, OSA, morbid obesity, COPD, CAD, DVTs, PEs who developed sudden onset left-sided weakness, left vision loss while vacationing in [place] .  Was given TNK 9/28.  Post TNK developed right posterior MCA stroke with petechial hemorrhage and overlying small SAH.  Workup there also revealed left popliteal DVT and small volume PE.  IVC filter was placed 10/8.  Patient was Transported by privately hired ambulance to [facility], arrived 10/11.NIH on Admission10  Stroke:  right MCA infarct s/p TNK with subsequent petechial hemorrhage, etiology:  large vessel disease, extensive atherosclerosis  9/28 presented to OSH for R MCA infarct-->treated with TNK Post-TNK imaging showed petechial hemorrhage and subarachnoid hemorrhage Now back to [facility] for continued management  CT head 10/11 In comparison to outside CT head from 05/05/2024, no substantial change in large right MCA territory infarct with petechial hemorrhage and overlying small volume of subarachnoid hemorrhage. Similar 4 mm leftward midline shift. CTA head & neck: branch stenosis at right MCA bifurcation, widespread atherosclerosis in head and neck.  Patent left carotid vascular stent. Notable stenosis: Left vertebral artery origin, both ICA siphons, right vertebral artery  origin MRI  PENDING for repeat MRI at [facility] 2D Echo PENDING LDL 25 HgbA1c 6.7 VTE prophylaxis - lovenox aspirin 325 mg daily prior to admission, now on pradaxa given stroke has been 2 weeks and pt allergic to heparin   Therapy recommendations:  CIR Disposition:  pending  Recent left popliteal DVT, recent small volume PE right lower lobe Likely DVT post stroke due to immobility  IVC filter placed at OSH Now on pradaxa given stroke has been 2 weeks and pt allergic to heparin   Hypertension Home meds:  losartan 100mg  daily, hydrochlorothiazide daily, lasix  20mg   daily Stable BP goal < 160 Avoid hypotension due to severe atherosclerosis  Long term BP goal normotensive  Hyperlipidemia Home meds:  zetia  10 LDL 25, goal < 70 Now on lipitor 40 Continue at discharge  Diabetes type II Controlled Home meds:  metformin, insulin HgbA1c 6.7, goal < 7.0 CBGs SSI Recommend close follow-up with PCP for better DM control  Tobacco Abuse Patient smokes 2 packs per day        Ready to quit? Yes Nicotine replacement therapy provided   Dysphagia Patient has post-stroke dysphagia, SLP consulted On dys 3 diet and thin liquuid Advance diet as tolerated  Other Stroke Risk Factors Obesity, Body mass index is 43.54 kg/m., BMI >/= 30 associated with increased stroke risk, recommend weight loss, diet and exercise as appropriate  Coronary artery disease Obstructive sleep apnea, on CPAP at home  Other Active Problems   Hospital day # 1   Pt seen by Neuro NP/APP and later by MD. Note/plan to be edited by MD as needed.    Darren JAYSON Likes, DNP [facility] Please use AMION for contact information & EPIC for messaging.  ATTENDING NOTE: I reviewed above note and agree with the assessment and plan. Pt was seen and examined.   Wife and daughter are at the bedside. Pt sleepy but eyes open on voice and able to cooperative with exam. With eyes open, he is orientated to age, place, time and people. No aphasia, paucity of speech but following all simple commands. Able to name and repeat. R gaze preference but cross midline, just not able to have complete L gaze, visual field full. L facial droop. Tongue midline. LUE 3-/5 and LLE 3-/5, RUE and RLE 5/5. Sensation decreased on the left , R FTN intact, gait not tested.   For detailed assessment and plan, please refer to above as I have made changes wherever appropriate.   Darren Cummins, MD PhD Stroke Neurology 05/08/2024 6:44 PM   To contact Stroke Continuity provider, please refer to [email or web address]. After  hours, contact General Neurology

## 2024-05-08 NOTE — Consult Note (Addendum)
 NEUROLOGY CONSULT NOTE   Date of service: May 08, 2024 Patient Name: Darren Hunt MRN:  [id number] DOB:  Dec 16, 1950 Chief Complaint: Posterior R MCA stroke Requesting Provider: Sundil, Subrina, MD  History of Present Illness  Darren Hunt is a 73 y.o. male with hx of CAD, hypertension, diabetes type 2, hyperlipidemia, OSA, morbid obesity, COPD prior history of DVTs and PEs who was vacationing in [place]  where he developed having sudden onset left-sided weakness and left vision loss and was taken to outside hospital where he received thrombolytics. Post TNK, noted to have developed a R posterior MCA stroke with petechial hemorrhage and overlying smal SAH.  CT head tonight with right Posterior MCA distribution infarct with 4 mm midline shift that appears to be stable compared to [facility] from outside facility that was obtained on 05/05/2024.  CT head also demonstrates petechial hemorrhage with small overlying subarachnoid hemorrhage.  In addition to the stroke, imaging and further workup at outside hospital demonstrated a left popliteal DVT along with a small volume PE in the right lower lobe on the CT of the chest.  It seems like patient had a IVC filter placed.  Family left the outside hospital AMA and hired an ambulance to bring the patient to [place] [facility] and closer to his home.  Patient was brought into the ED where he was admitted to hospital service and neurology was consulted.  LKW: 04/24/2024 Modified rankin score: 2-Slight disability-UNABLE to perform all activities but does not need assistance IV Thrombolysis: Given at outside hospital.   EVT: not offered, unclear why from looking at the documentation sent from OSH, perhaps no LVO?.   NIHSS components Score: Comment  1a Level of Conscious 0[x]  1[]  2[]  3[]      1b LOC Questions 0[x]  1[]  2[]       1c LOC Commands 0[x]  1[]  2[]       2 Best Gaze 0[]  1[x]  2[]       3 Visual 0[]  1[]  2[x]  3[]      4 Facial  Palsy 0[]  1[x]  2[]  3[]      5a Motor Arm - left 0[]  1[x]  2[]  3[]  4[]  UN[]    5b Motor Arm - Right 0[x]  1[]  2[]  3[]  4[]  UN[]    6a Motor Leg - Left 0[]  1[]  2[x]  3[]  4[]  UN[]    6b Motor Leg - Right 0[x]  1[]  2[]  3[]  4[]  UN[]    7 Limb Ataxia 0[x]  1[]  2[]  UN[]      8 Sensory 0[]  1[]  2[x]  UN[]      9 Best Language 0[x]  1[]  2[]  3[]      10 Dysarthria 0[]  1[x]  2[]  UN[]      11 Extinct. 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   Aortic root dilatation 08/27/2021   Aortic valve stenosis 10/14/2023   At risk for heart failure 01/29/2022   Bilateral carotid artery stenosis 08/12/2016   CAD (coronary artery disease) 01/11/2024   Candidal intertrigo 02/06/2022   Cellulitis of right leg 08/05/2021   Chronic diarrhea 05/26/2017   Chronic hypoxemic respiratory failure (HCC) 08/11/2023   Chronic obstructive lung disease (HCC) 08/27/2021   Chronic pain of both hips 12/29/2018   Chronic pain of both knees 07/11/2016   Chronic  pain of both shoulders 12/29/2018   Chronic ulcer of right foot (HCC) 08/27/2021   Congestive heart failure (HCC) 07/06/2019   Continuous opioid dependence (HCC) 08/10/2023   Contracture of both Achilles tendons 02/01/2018   Essential hypertension 02/10/2012   Hardening of the aorta (main artery of the heart) 12/03/2021   Heart murmur 09/03/2023   History of pulmonary embolism 08/27/2021   Joint pain 08/27/2021   Microalbuminuria due to type 2 diabetes mellitus (HCC) 07/25/2022   Mixed hyperlipidemia 07/11/2016   Morbid obesity (HCC) 07/06/2019   Obstructive lung disease (HCC) 07/06/2019   Obstructive sleep apnea (adult) (pediatric) 05/26/2017   Onychomycosis due to dermatophyte 09/28/2018   Osteoarthritis of right knee 12/24/2021    Other specified personal risk factors, not elsewhere classified 12/03/2021   Pre-ulcerative calluses 02/01/2018   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 On coumadin for 3 months.     Last Assessment & Plan:   Relevant Hx:  Course:  Daily Update:  Today's Plan:     Shortness of breath 02/10/2012   Smoking addiction 01/11/2024   Statin not tolerated 12/31/2021   Tobacco abuse 07/06/2019   Active tobacco use 2 packs/day x 34 years     Type 2 diabetes mellitus (HCC) 02/10/2012   A. With retinopathy and neuropathy  B. Long-term use of insulin     Type 2 diabetes mellitus with diabetic neuropathy, with long-term current use of insulin (HCC) 07/11/2016   Ulcer of midfoot due to diabetes mellitus (HCC) 12/03/2021   Venous insufficiency of right leg 08/05/2021   Venous stasis 08/12/2016    History reviewed. No pertinent surgical history.  Family History: History reviewed. No pertinent family history.  Social History  reports that he has been smoking cigarettes. He has never used smokeless tobacco. No history on file for alcohol use and drug use.  Allergies  Allergen Reactions   Heparin     Agitation    Medications   Current Facility-Administered Medications:    0.9 %  sodium chloride  infusion, 250 mL, Intravenous, PRN, Sundil, Subrina, MD   acetaminophen (TYLENOL) tablet 650 mg, 650 mg, Oral, Q6H PRN **OR** acetaminophen (TYLENOL) suppository 650 mg, 650 mg, Rectal, Q6H PRN, Sundil, Subrina, MD   aspirin EC tablet 81 mg, 81 mg, Oral, Daily, Sundil, Subrina, MD, 81 mg at 05/07/24 2356   atorvastatin (LIPITOR) tablet 40 mg, 40 mg, Oral, Daily, Sundil, Subrina, MD   DULoxetine (CYMBALTA) DR capsule 30 mg, 30 mg, Oral, Daily, Sundil, Subrina, MD   enoxaparin (LOVENOX) injection 75 mg, 75 mg, Subcutaneous, Q24H, Sundil, Subrina,  MD   FLUoxetine (PROZAC) capsule 20 mg, 20 mg, Oral, Daily, Sundil, Subrina, MD   furosemide  (LASIX ) tablet 40 mg, 40 mg, Oral, Daily, Sundil, Subrina, MD   HYDROcodone-acetaminophen (NORCO) 7.5-325 MG per tablet 1 tablet, 1 tablet, Oral, Q6H PRN, Sundil, Subrina, MD   insulin aspart (novoLOG) injection 0-5 Units, 0-5 Units, Subcutaneous, QHS, Sundil, Subrina, MD   insulin aspart (novoLOG) injection 0-6 Units, 0-6 Units, Subcutaneous, TID WC, Sundil, Subrina, MD   insulin glargine (LANTUS) injection 10 Units, 10 Units, Subcutaneous, Q2200, Sundil, Subrina, MD, 10 Units at 05/07/24 2355   ipratropium-albuterol  (DUONEB) 0.5-2.5 (3) MG/3ML nebulizer solution 3 mL, 3 mL, Nebulization, Q4H PRN, Sundil, Subrina, MD   losartan (COZAAR) tablet 50 mg, 50 mg, Oral, Daily, Sundil, Subrina, MD  ondansetron  (ZOFRAN ) tablet 4 mg, 4 mg, Oral, Q6H PRN **OR** ondansetron  (ZOFRAN ) injection 4 mg, 4 mg, Intravenous, Q6H PRN, Sundil, Subrina, MD   pantoprazole (PROTONIX) EC tablet 40 mg, 40 mg, Oral, Daily, Sundil, Subrina, MD   polyethylene glycol (MIRALAX / GLYCOLAX) packet 17 g, 17 g, Oral, Daily, Sundil, Subrina, MD   sodium chloride  flush (NS) 0.9 % injection 3 mL, 3 mL, Intravenous, Q12H, Sundil, Subrina, MD, 3 mL at 05/07/24 2155   sodium chloride  flush (NS) 0.9 % injection 3 mL, 3 mL, Intravenous, Q12H, Sundil, Subrina, MD   sodium chloride  flush (NS) 0.9 % injection 3 mL, 3 mL, Intravenous, PRN, Sundil, Subrina, MD   umeclidinium bromide (INCRUSE ELLIPTA) 62.5 MCG/ACT 1 puff, 1 puff, Inhalation, Daily, Sundil, Subrina, MD  Current Outpatient Medications:    albuterol  (VENTOLIN  HFA) 108 (90 Base) MCG/ACT inhaler, Inhale 2 puffs into the lungs every 6 (six) hours as needed for wheezing or shortness of breath., Disp: , Rfl:    aspirin 325 MG tablet, Take 325 mg by mouth daily., Disp: , Rfl:    DULoxetine (CYMBALTA) 30 MG capsule, Take 30 mg by mouth daily. Take with 60mg  for a total dose of 90mg , Disp: , Rfl:     DULoxetine (CYMBALTA) 60 MG capsule, Take 60 mg by mouth daily., Disp: , Rfl:    famotidine  (PEPCID ) 20 MG tablet, Take 20 mg by mouth daily., Disp: , Rfl:    furosemide  (LASIX ) 20 MG tablet, Take 1 tablet (20 mg total) by mouth daily as needed for fluid or edema (take 20 mg if you have a weight gain of 2-3 pounds overnight or 5 pounds in one week)., Disp: 75 tablet, Rfl: 3   gabapentin (NEURONTIN) 300 MG capsule, Take 900 mg by mouth at bedtime., Disp: , Rfl:    hydrochlorothiazide (HYDRODIURIL) 25 MG tablet, TAKE 1 TABLET BY MOUTH  DAILY, Disp: , Rfl:    HYDROcodone-acetaminophen (NORCO) 7.5-325 MG tablet, Take 1 tablet by mouth 3 (three) times daily as needed for moderate pain (pain score 4-6)., Disp: , Rfl:    insulin aspart (NOVOLOG) 100 UNIT/ML injection, Inject 20 Units into the skin 3 (three) times daily. INJECT 20 UNIT SUBCUTANEOUSLY TWICE A DAY 20 UNITS TWO TIMES A DAY AND 30 UNITS ONCE AT NIGHT, Disp: , Rfl:    Insulin Glargine, 2 Unit Dial, (TOUJEO MAX SOLOSTAR) 300 UNIT/ML SOPN, INJECT SUBCUTANEOUSLY 100  UNITS NIGHTLY., Disp: , Rfl:    losartan (COZAAR) 100 MG tablet, TAKE 1 TABLET BY MOUTH  DAILY, Disp: , Rfl:    melatonin 3 MG TABS tablet, Take 6 mg by mouth at bedtime., Disp: , Rfl:    metFORMIN (GLUCOPHAGE) 500 MG tablet, TAKE 1 TABLET BY MOUTH  TWICE DAILY WITH FOOD, Disp: , Rfl:    ezetimibe  (ZETIA ) 10 MG tablet, Take 1 tablet (10 mg total) by mouth daily. (Patient not taking: Reported on 05/08/2024), Disp: 90 tablet, Rfl: 3   Gabapentin, Once-Daily, 300 MG TABS, Take by mouth., Disp: , Rfl:    glucose blood (ONETOUCH ULTRA TEST) test strip, 1 each by Other route as directed., Disp: , Rfl:    minocycline (MINOCIN) 100 MG capsule, Take 100 mg by mouth 2 (two) times daily. (Patient not taking: Reported on 05/08/2024), Disp: , Rfl:    Semaglutide, 2 MG/DOSE, (OZEMPIC, 2 MG/DOSE,) 8 MG/3ML SOPN, Inject 2 mg into the skin once a week. (Patient not taking: Reported on 05/08/2024),  Disp: , Rfl:   Vitals   Vitals:  04-Jun-2024 1901 06-04-2024 1904 06/04/2024 1930 06-04-24 2312  BP:   119/74 119/74  Pulse:   97 97  Resp:    18  Temp:    98.5 F (36.9 C)  TempSrc:    Oral  SpO2: 99%  98% 98%  Weight:  (!) 149.7 kg    Height:  6' 1 (1.854 m)      Body mass index is 43.54 kg/m.   Physical Exam   General: Laying comfortably in bed; in no acute distress.  HENT: Normal oropharynx and mucosa. Normal external appearance of ears and nose.  Neck: Supple, no pain or tenderness  CV: No JVD. No peripheral edema.  Pulmonary: Symmetric Chest rise. Normal respiratory effort.  Abdomen: Soft to touch, non-tender.  Ext: No cyanosis, edema, or deformity  Skin: No rash. Normal palpation of skin.   Musculoskeletal: Normal digits and nails by inspection. No clubbing.   Neurologic Examination  Mental status/Cognition: Alert, oriented to self, place, month and year, good attention.  Speech/language: Fluent, comprehension intact, object naming intact, repetition intact.  Cranial nerves:   CN II Pupils equal and reactive to light, left hemianopsia   CN III,IV,VI Right gaze preference, crosses midline slightly.   CN V Absent in left lower face   CN VII Left facial droop   CN VIII normal hearing to speech   CN IX & X normal palatal elevation, no uvular deviation   CN XI 5/5 head turn and 5/5 shoulder shrug bilaterally   CN XII midline tongue protrusion   Motor:  Muscle bulk: Normal, tone poor in left upper extremity Mvmt Root Nerve  Muscle Right Left Comments  SA C5/6 Ax Deltoid 5 3   EF C5/6 Mc Biceps 5 2   EE C6/7/8 Rad Triceps 5 2   WF C6/7 Med FCR     WE C7/8 PIN ECU     F Ab C8/T1 U ADM/FDI 5 0   HF L1/2/3 Fem Illopsoas 5 4   KE L2/3/4 Fem Quad     DF L4/5 D Peron Tib Ant 5 4   PF S1/2 Tibial Grc/Sol 5 4    Sensation:  Light touch Absent in left face, left arm and left leg.   Pin prick    Temperature    Vibration   Proprioception    Coordination/Complex  Motor:  - Finger to Nose intact in right upper extremity - Heel to shin unable to do - Rapid alternating movement intact in right upper extremity, unable to do a left. - Gait: Deferred for patient safety. Labs/Imaging/Neurodiagnostic studies   CBC:  Recent Labs  Lab 06/04/2024 2001  WBC 10.1  NEUTROABS 7.1  HGB 16.1  HCT 47.4  MCV 90.8  PLT 284   Basic Metabolic Panel:  Lab Results  Component Value Date   NA 137 06/04/2024   K 4.5 04-Jun-2024   CO2 29 2024/06/04   GLUCOSE 164 (H) 06/04/2024   BUN 21 06-04-24   CREATININE 0.98 04-Jun-2024   CALCIUM 9.2 04-Jun-2024   GFRNONAA >60 2024/06/04   Lipid Panel: No results found for: LDLCALC HgbA1c:  Lab Results  Component Value Date   HGBA1C 6.7 (H) 06-04-2024   Urine Drug Screen: No results found for: LABOPIA, COCAINSCRNUR, LABBENZ, AMPHETMU, THCU, LABBARB  Alcohol Level No results found for: ETH INR No results found for: INR APTT No results found for: APTT AED levels: No results found for: PHENYTOIN, ZONISAMIDE, LAMOTRIGINE, LEVETIRACETA  CT Head without contrast(Personally reviewed): 1. In comparison to  outside CT head from 05/05/2024, no substantial change in large right MCA territory infarct with petechial hemorrhage and overlying small volume of subarachnoid hemorrhage. 2. Similar 4 mm leftward midline shift.  CT angio Head and Neck with contrast: Unable to review images sent from outside hospital.  Review of records from outside hospital shows that a CT of the neck with at least obtained and concerning for right ICA stenosis.  MRI Brain: It seems that this was obtained at outside hospital and was noted to show hemorrhage and Posterior right MCA infarct.  I was unable to review the images sent on the disc.   ASSESSMENT   Darren Hunt is a 73 y.o. male with hx of CAD, hypertension, diabetes type 2, hyperlipidemia, OSA, morbid obesity, COPD prior history of DVTs and PEs who was  vacationing in [place]  where he developed having sudden onset left-sided weakness and left vision loss and was taken to outside hospital where he received thrombolytics. Post TNK, noted to have developed a R posterior MCA stroke with petechial hemorrhage and overlying smal SAH.  It seems like some workup was done at outside hospital including MRI of the brain and the CT of the neck.  I am unable to locate whether a CT of the head was done and an echocardiogram was obtained.  Wife did bring all the images on a disk, but they are not viewable at this time.  I would recommend trying to get those uploaded into the chart today in the AM.  The discs with images are with the patient in the room.  RECOMMENDATIONS  - Frequent Neuro checks per stroke unit protocol - Recommend Vascular imaging with CT angio of the head and neck if not already obtained. - Recommend obtaining TTE if not already obtained at outside hospital. - Recommend obtaining Lipid panel with LDL - Please start statin if LDL > 70 - Recommend HbA1c to evaluate for diabetes and how well it is controlled. - Antithrombotic -continue aspirin 81 mg daily for now. - Recommend DVT ppx -Consider starting anticoagulation with neuro scale heparin gtt. on Monday per plan and outside hospital.  Please discontinue aspirin and DVT prophylaxis when starting anticoagulation. - SBP goal -aim for gradual normotension. - Recommend Telemetry monitoring for arrythmia - Recommend bedside swallow screen prior to PO intake. - Stroke education booklet - Recommend PT/OT/SLP consult - Further workup pending review of images that were obtained at outside hospital.  ______________________________________________________________________  Plan discussed with patient and wife at the bedside.  Plan discussed with Dr. Sundil earlier with the Hospitalist team.  Signed, Chaundra Abreu, MD [facility]

## 2024-05-08 NOTE — Assessment & Plan Note (Signed)
 05/08/24 . Present on admission. Wound 05/08/24 1009 Pressure Injury Coccyx Stage 2 -  Partial thickness loss of dermis presenting as a shallow open injury with a red, pink wound bed without slough. (Active)

## 2024-05-08 NOTE — Care Plan (Signed)
 Patient arrived to the unit AAOX 3 No signs/symptoms of distress Skin assessment done with [place]ford OBIE Raring, RN

## 2024-05-08 NOTE — ED Notes (Signed)
 Pt family bought from home wedges to place under patient. Explained to patient family that they are welcome to place the wedges however we do not have orders to place any wedges under the patient and they could do so at their own risk

## 2024-05-08 NOTE — Progress Notes (Signed)
 PHARMACY - ANTICOAGULATION CONSULT NOTE  Pharmacy Consult for Dabigatran Indication: pulmonary embolus, DVT, and stroke  Allergies  Allergen Reactions   Heparin     Agitation    Patient Measurements: Height: 6' 1 (185.4 cm) Weight: (!) 149.7 kg (330 lb) IBW/kg (Calculated) : 79.9 HEPARIN DW (KG): 114.8  Vital Signs: Temp: 98.4 F (36.9 C) (10/12 1736) Temp Source: Oral (10/12 1001) BP: 108/69 (10/12 1810) Pulse Rate: 75 (10/12 1735)  Labs: Recent Labs    05/07/24 2001 05/08/24 0512  HGB 16.1 14.8  HCT 47.4 43.4  PLT 284 271  CREATININE 0.98 0.85    Estimated Creatinine Clearance: 119.8 mL/min (by C-G formula based on SCr of 0.85 mg/dL).   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   Aortic root dilatation 08/27/2021   Aortic valve stenosis 10/14/2023   At risk for heart failure 01/29/2022   Bilateral carotid artery stenosis 08/12/2016   CAD (coronary artery disease) 01/11/2024   Candidal intertrigo 02/06/2022   Cellulitis of right leg 08/05/2021   Chronic diarrhea 05/26/2017   Chronic hypoxemic respiratory failure (HCC) 08/11/2023   Chronic obstructive lung disease (HCC) 08/27/2021   Chronic pain of both hips 12/29/2018   Chronic pain of both knees 07/11/2016   Chronic pain of both shoulders 12/29/2018   Chronic ulcer of right foot (HCC) 08/27/2021   Congestive heart failure (HCC) 07/06/2019   Continuous opioid dependence (HCC) 08/10/2023   Contracture of both Achilles tendons 02/01/2018   Essential hypertension 02/10/2012   Hardening of the aorta (main artery of the heart) 12/03/2021   Heart murmur 09/03/2023   History of pulmonary embolism 08/27/2021   Joint pain 08/27/2021   Microalbuminuria due to type 2 diabetes mellitus (HCC) 07/25/2022   Mixed hyperlipidemia 07/11/2016   Morbid obesity (HCC) 07/06/2019   Obstructive lung  disease (HCC) 07/06/2019   Obstructive sleep apnea (adult) (pediatric) 05/26/2017   Onychomycosis due to dermatophyte 09/28/2018   Osteoarthritis of right knee 12/24/2021   Other specified personal risk factors, not elsewhere classified 12/03/2021   Pre-ulcerative calluses 02/01/2018   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 On coumadin for 3 months.     Last Assessment & Plan:   Relevant Hx:  Course:  Daily Update:  Today's Plan:     Shortness of breath 02/10/2012   Smoking addiction 01/11/2024   Statin not tolerated 12/31/2021   Tobacco abuse 07/06/2019   Active tobacco use 2 packs/day x 34 years     Type 2 diabetes mellitus (HCC) 02/10/2012   A. With retinopathy and neuropathy  B. Long-term use of insulin     Type 2 diabetes mellitus with diabetic neuropathy, with long-term current use of insulin (HCC) 07/11/2016   Ulcer of midfoot due to diabetes mellitus (HCC) 12/03/2021   Venous insufficiency of right leg 08/05/2021   Venous stasis 08/12/2016    Medications:  Scheduled:   amantadine  200 mg Oral BID   aspirin EC  81 mg Oral Daily   atorvastatin  40 mg Oral Daily   DULoxetine  30 mg Oral Daily   enoxaparin (LOVENOX) injection  75 mg Subcutaneous Q24H   FLUoxetine  20 mg Oral Daily   furosemide   40 mg Oral Daily  insulin aspart  0-5 Units Subcutaneous QHS   insulin aspart  0-6 Units Subcutaneous TID WC   insulin glargine  10 Units Subcutaneous Q2200   LORazepam  1 mg Intravenous Once   losartan  50 mg Oral Daily   pantoprazole  40 mg Oral Daily   polyethylene glycol  17 g Oral Daily   sodium chloride  flush  3 mL Intravenous Q12H   sodium chloride  flush  3 mL Intravenous Q12H   umeclidinium bromide  1 puff Inhalation Daily   Infusions:   sodium chloride      PRN: sodium chloride , acetaminophen **OR**  acetaminophen, HYDROcodone-acetaminophen, ipratropium-albuterol , ondansetron  **OR** ondansetron  (ZOFRAN ) IV, sodium chloride  flush  Assessment: 73 yo male s/p ischemic CVA 04/24/24 with residual hemiparesis status post TNK, acute micro intracranial hemorrhage, acute DVT, acute PE. Pharmacy consulted to dose dabigatran (Pradaxa). Per discussion with Neuro, will forgo the typical 5 day initial treatment with parenteral anticoagulant given placement of IVC 10/8.   Goal of Therapy:  Therapeutic anticoagulation   Plan:  Dabigatran 150mg  PO BID Will run co-pay check Monday and provide education prior to discharge  Rocky Slade, PharmD, BCPS 05/08/2024,6:30 PM

## 2024-05-08 NOTE — Hospital Course (Signed)
 CC: left AMA from [facility] in [place], [place]  HPI: Darren Hunt is a 73 y.o. male with medical history significant of bilateral carotid artery stent, CAD with cardiac stent, essential hypertension, insulin-dependent DM type II, hyperlipidemia, obstructive sleep apnea, chronic smoking cigarette, COPD, chronic hypoxic nocturnal respiratory failure on 3 L oxygen, aortic atherosclerosis, aortic root dilation, hyperlipidemia, history of DVT and PE 2023 who initially admitted to [facility], [place], [place]. Patient was transferred to [facility] as per family request.   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  Last head CT scan from 10/7 showed petechial hemorrhage with midline shift unchanged.    Later on, found to have left popliteal vein DVT and subsequent CTA chest showed PE but he can't be anticoagulated because of the brain bleed. Neurologist there has recommended to repeat a CT head on Monday and if stable findings, then DOACs can be initiated. He was supposed to get PEG but he and his family refused. He has poor oral intake and dense left sided hemiparesis. He was supposed to go to inpatient rehab in [place] but family refused at the last moment and wanted him to be transferred to [facility] for further evaluation. He uses CPAP at night and 3 L nasal oxygen during the day.   Eventually patient's family left AMA from outside facility and came to [facility].   At presentation to our emergency department at [facility] patient is hemodynamically stable. Pending CBC, CMP and chest x-ray. Pending EKG.   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 Matthews recommended to start with a CT head without contrast and requested to get all the imaging from the outside facility.   Patient's wife at the whole package including the outside imaging recording in a CD.  I have informed patient's nurse to upload all the information into epic and neurology recommended once it has been done we have to reach them out for formal consult.   Per chart review of the paperwork Most recent head CT scan from 05/03/2024 showed:   There is a large cortical infraction in the right  region 4 mm at of midline shift from right to left unchanged.  Small petechial hemorrhage are present within the infraction but no frank hematoma detected.      Patient's discharge medication include Cymbalta 30 mg daily, aspirin 81 mg daily, Lipitor 40 mg daily, Prozac 20 mg daily, losartan 100 mg daily, hydrocodone, albuterol  nebulizer, Trelegy Ellipta, Lasix  40 mg daily, Lantus 10 unit at bedtime, short acting insulin 4 to 14 unit with meal and bedtime, melatonin, Protonix 40 mg daily, MiraLAX.  Significant Events: Admitted 05/07/2024 for acute CVA   Admission Labs: WBC 10.1, HgB 16.1, plt 284 A1c of 6.7% Na 137, K 4.5, CO2 of 29, BUN 21, Scr 0.98, glu 164 T. Prot 7.0, alb 3.2, AST 40, ALT 38, alk phos 55, t. Bili 2.2  Admission Imaging Studies: CXR No active disease.  CT head In comparison to outside CT head from 05/05/2024, no substantial change in large right MCA territory infarct with petechial hemorrhage and overlying small volume of subarachnoid hemorrhage. 2. Similar  4 mm leftward midline shift.  Significant Labs:   Significant Imaging Studies:   Antibiotic Therapy: Anti-infectives (From admission, onward)    None       Procedures:   Consultants: neurology

## 2024-05-08 NOTE — Care Management (Addendum)
 Transition of Care [facility]) - Inpatient Brief Assessment   Patient Details  Name: Darren Hunt MRN: [id number] Date of Birth: 03-09-1951  Transition of Care [facility]) CM/SW Contact:    Corean JAYSON Canary, RN Phone Number: 05/08/2024, 1:57 PM   Clinical Narrative:  73 year old was vacationing in [place]  with his family when he had hemiparesis, stroke was at a hospital in  [place] . Wife set up ambulance to bring him back to [facility] and checked in to validaate/ get a second opinion.Called [facility] as he was apparently accepted there from The hospital in [place] .  Talked to admissions, they do have  authorization through May 12, 2023. All this information discussed in team.MF< Nsg and IP care management. Likely will DC tomorrow to [facility]  [phone]  Delon  called back from [place] her number is  [phone]  She will be following up tomorrow, but wanted her contact information in his chart. S Tottman CSW sent  patient information through HUB.  1430 Code 44 done, explained to wife disposition, questions answered to the best of this writers ability. Plan is to discharge and go to [facility] re[facility] tomorrow  Transition of Care Asessment: Insurance and Status: Insurance coverage has been reviewed Patient has primary care physician: Yes Home environment has been reviewed: Lives with  wife Prior level of function:: Independent Prior/Current Home Services: No current home services   Readmission risk has been reviewed: Yes Transition of care needs: transition of care needs identified, TOC will continue to follow

## 2024-05-09 ENCOUNTER — Observation Stay (HOSPITAL_COMMUNITY)

## 2024-05-09 ENCOUNTER — Telehealth (HOSPITAL_COMMUNITY): Payer: Self-pay | Admitting: Pharmacy Technician

## 2024-05-09 ENCOUNTER — Other Ambulatory Visit (HOSPITAL_COMMUNITY): Payer: Self-pay

## 2024-05-09 DIAGNOSIS — I609 Nontraumatic subarachnoid hemorrhage, unspecified: Secondary | ICD-10-CM | POA: Diagnosis not present

## 2024-05-09 DIAGNOSIS — I6389 Other cerebral infarction: Secondary | ICD-10-CM

## 2024-05-09 DIAGNOSIS — I629 Nontraumatic intracranial hemorrhage, unspecified: Secondary | ICD-10-CM | POA: Diagnosis not present

## 2024-05-09 DIAGNOSIS — I639 Cerebral infarction, unspecified: Secondary | ICD-10-CM | POA: Diagnosis not present

## 2024-05-09 DIAGNOSIS — R1310 Dysphagia, unspecified: Secondary | ICD-10-CM | POA: Diagnosis not present

## 2024-05-09 DIAGNOSIS — F17210 Nicotine dependence, cigarettes, uncomplicated: Secondary | ICD-10-CM

## 2024-05-09 DIAGNOSIS — I7090 Unspecified atherosclerosis: Secondary | ICD-10-CM | POA: Diagnosis not present

## 2024-05-09 DIAGNOSIS — I2699 Other pulmonary embolism without acute cor pulmonale: Secondary | ICD-10-CM

## 2024-05-09 DIAGNOSIS — R29708 NIHSS score 8: Secondary | ICD-10-CM | POA: Diagnosis not present

## 2024-05-09 DIAGNOSIS — E785 Hyperlipidemia, unspecified: Secondary | ICD-10-CM

## 2024-05-09 DIAGNOSIS — I63511 Cerebral infarction due to unspecified occlusion or stenosis of right middle cerebral artery: Secondary | ICD-10-CM | POA: Diagnosis not present

## 2024-05-09 DIAGNOSIS — I69391 Dysphagia following cerebral infarction: Secondary | ICD-10-CM

## 2024-05-09 LAB — ECHOCARDIOGRAM LIMITED
AR max vel: 2.01 cm2
AV Area VTI: 2.34 cm2
AV Area mean vel: 1.98 cm2
AV Mean grad: 32 mmHg
AV Peak grad: 49.6 mmHg
Ao pk vel: 3.52 m/s
Area-P 1/2: 3.48 cm2
Height: 73 in
S' Lateral: 4.1 cm
Weight: 5280 [oz_av]

## 2024-05-09 LAB — GLUCOSE, CAPILLARY
Glucose-Capillary: 154 mg/dL — ABNORMAL HIGH (ref 70–99)
Glucose-Capillary: 154 mg/dL — ABNORMAL HIGH (ref 70–99)
Glucose-Capillary: 139 mg/dL — ABNORMAL HIGH (ref 70–99)
Glucose-Capillary: 150 mg/dL — ABNORMAL HIGH (ref 70–99)

## 2024-05-09 MED ORDER — DABIGATRAN ETEXILATE MESYLATE 150 MG PO CAPS: 150.0000 mg | ORAL_CAPSULE | Freq: Two times a day (BID) | ORAL | Status: DC

## 2024-05-09 MED ORDER — STROKE: EARLY STAGES OF RECOVERY BOOK
Freq: Once | Status: AC
  Administered 2024-05-09: 1
  Filled 2024-05-09: qty 1

## 2024-05-09 NOTE — Evaluation (Signed)
 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   Aortic root dilatation 08/27/2021   Aortic valve stenosis 10/14/2023   At risk for heart failure 01/29/2022   Bilateral carotid artery stenosis 08/12/2016   CAD (coronary artery disease) 01/11/2024   Candidal intertrigo 02/06/2022   Cellulitis of right leg 08/05/2021   Chronic diarrhea 05/26/2017   Chronic hypoxemic respiratory failure (HCC) 08/11/2023   Chronic obstructive lung disease (HCC) 08/27/2021   Chronic pain of both hips 12/29/2018   Chronic pain of both knees 07/11/2016   Chronic pain of both shoulders 12/29/2018   Chronic ulcer of right foot (HCC) 08/27/2021   Congestive heart failure (HCC) 07/06/2019   Continuous opioid dependence (HCC) 08/10/2023   Contracture of both Achilles tendons 02/01/2018   Essential hypertension 02/10/2012   Hardening of the aorta (main artery of the heart) 12/03/2021   Heart murmur 09/03/2023   History of pulmonary embolism 08/27/2021   Joint pain 08/27/2021   Microalbuminuria due to type 2 diabetes mellitus (HCC) 07/25/2022   Mixed hyperlipidemia 07/11/2016   Morbid obesity (HCC) 07/06/2019   Obstructive lung disease (HCC) 07/06/2019   Obstructive sleep apnea (adult) (pediatric) 05/26/2017   Onychomycosis due to dermatophyte 09/28/2018   Osteoarthritis of right knee 12/24/2021   Other specified personal risk factors, not elsewhere classified 12/03/2021   Pre-ulcerative calluses 02/01/2018   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 On coumadin for 3 months.     Last Assessment & Plan:   Relevant Hx:  Course:  Daily Update:  Today's Plan:     Shortness of breath 02/10/2012   Smoking addiction 01/11/2024   Statin not tolerated 12/31/2021   Tobacco abuse 07/06/2019   Active tobacco use 2 packs/day x 34 years     Type 2 diabetes mellitus (HCC) 02/10/2012   A. With retinopathy and neuropathy  B. Long-term use of insulin     Type 2 diabetes mellitus with diabetic neuropathy, with long-term current use of insulin (HCC) 07/11/2016   Ulcer of midfoot due to diabetes mellitus (HCC) 12/03/2021   Venous insufficiency of right leg 08/05/2021   Venous stasis 08/12/2016   Past Surgical History:  Past Surgical History:  Procedure Laterality Date   IVC FILTER INSERTION  05/04/2024   [facility], [address] [place], [place] [address]   HPI:  Patient is a 73 y.o.  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  Hospital course complicated by left popliteal DVT and small volume PE.  Family signed him out of hospital in [place] -hired private ambulance and brought him to [facility] for a second opinion..    Assessment / Plan / Recommendation  Clinical Impression  Pt demonstrates a mild oral dysphagia with left facial and  left sensory weakness leading to mild residue on left lips and in buccal cavity. No signs of aspiration of liquids and pt has been tolerating these for many days. However, pt has had very poor oral intake regardless of food texture; wife reports even if offered favorite foods pt does not eat and she is concerned about his nutrition. Given observation of  slow oral transit, poor initiation and poorly sustained attention suspect right hemisphere disorder is impacting appetite and attention with meals. Pt did improve with self feeding and verbal cues today. Discussed these strategies with wife. She reports they expect to d/c to an inpatient rehab bed in [place] very soon and she is reluctant to make any changes at this time. Suggested wife ask about medication to treat appetite and depression with next provider. Pt to continue a mechanical soft diet and thin liquids while admitted, needs assist with meals. SLP Visit Diagnosis: Dysphagia, unspecified (R13.10)    Aspiration Risk  Risk for inadequate nutrition/hydration    Diet Recommendation Dysphagia 3 (Mech soft);Thin liquid    Liquid Administration via: Cup;Straw Medication Administration: Whole meds with liquid Supervision: Staff to assist with self feeding;Full supervision/cueing for compensatory strategies Compensations: Slow rate;Small sips/bites;Monitor for anterior loss;Follow solids with liquid Postural Changes: Seated upright at 90 degrees    Other  Recommendations Oral Care Recommendations: Oral care before and after PO     Assistance Recommended at Discharge    Functional Status Assessment Patient has had a recent decline in their functional status and demonstrates the ability to make significant improvements in function in a reasonable and predictable amount of time.  Frequency and Duration min 2x/week  2 weeks       Prognosis Prognosis for improved oropharyngeal function: Good      Swallow Study   General HPI: Patient is a 73 y.o.  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  Hospital course complicated by  left popliteal DVT and small volume PE.  Family signed him out of hospital in [place] -hired private ambulance and brought him to [facility] for a second opinion.. Type of Study: Bedside Swallow Evaluation Diet Prior to this Study: Dysphagia 3 (mechanical soft);Thin liquids (Level 0) Temperature Spikes Noted: No Respiratory Status: Other (comment) (nasal cannula but not in his nose) History of Recent Intubation: No Behavior/Cognition: Alert;Cooperative;Requires cueing Oral Cavity Assessment: Dry Oral Care Completed by SLP: No Oral Cavity - Dentition: Adequate natural dentition Vision: Impaired for self-feeding Self-Feeding Abilities: Needs assist;Needs set up Patient Positioning: Upright in bed Baseline Vocal Quality: Normal Volitional Cough: Strong Volitional Swallow: Able to elicit    Oral/Motor/Sensory Function Overall Oral Motor/Sensory Function: Moderate impairment Facial ROM: Reduced left;Suspected CN VII (facial) dysfunction Facial Symmetry: Abnormal symmetry left;Suspected CN VII (facial) dysfunction Facial Strength: Reduced left;Suspected CN VII (facial) dysfunction Facial Sensation: Reduced left;Suspected CN V (Trigeminal) dysfunction Lingual ROM: Reduced left;Suspected CN XII (hypoglossal) dysfunction   Ice Chips     Thin Liquid Thin Liquid: Within functional limits Presentation: Straw    Nectar Thick Nectar Thick Liquid: Not tested   Honey Thick Honey Thick Liquid: Not tested   Puree Puree: Within functional limits Presentation: Spoon   Solid     Solid: Impaired Presentation: Self Fed Oral Phase Impairments: Reduced lingual movement/coordination;Reduced labial seal  Oral Phase Functional Implications: Prolonged oral transit;Oral residue      Meril Dray, Consuelo Fitch 05/09/2024,2:22 PM

## 2024-05-09 NOTE — Progress Notes (Signed)
 Inpatient Rehab Admissions Coordinator:   Per therapy recommendations pt was screened for CIR by Reche Lowers, PT, DPT.  Note per chart review patient already has a bed at [facility] and planning for transfer there tomorrow.  If that should change, please let me know.    Reche Lowers, PT, DPT Admissions Coordinator [phone] 05/09/24  4:26 PM

## 2024-05-09 NOTE — Evaluation (Signed)
 Occupational Therapy Evaluation Patient Details Name: Darren Hunt MRN: [id number] DOB: 1950-08-26 Today's Date: 05/09/2024   History of Present Illness   Darren Hunt is a 73 y.o. male who presented to [facility] ED [facility]/07/24 with R posterior MCA CVA with 4mm midline shift and petechial hemorrhage with small overlying subarachnoid hemorrhage. NIHSS 10. He was vacationing in [place], [place] when the stroke occurred (9/28). Pt received TNK. Found to have L popliteal vein DVT and PE. Family hired ambulance to transport pt back closer to home. PMHx: CAD, HTN, T2DM, HLD, OSA, morbid obesity, COPD on 3L O2 at baseline, hx of DVTs and PEs.     Clinical Impressions [name] was evaluated s/p the above admission list. He is indep at baseline. Upon evaluation the pt was limited by impaired cognition, L hemibody paraesthesias and weakness (arm weaker than leg), general debility, poor balance and limited tolerance for activity. Overall he needed max A for bed mobility and sitting balance. Due to the deficits listed below the pt also needs up to total A for LB ADLs and mod-max A for UB ADLs. Pt will benefit from continued acute OT services and intensive inpatient follow up therapy, >3 hours/day after discharge.       If plan is discharge home, recommend the following:   A lot of help with walking and/or transfers;Two people to help with walking and/or transfers;Two people to help with bathing/dressing/bathroom;A lot of help with bathing/dressing/bathroom;Direct supervision/assist for medications management;Assist for transportation;Direct supervision/assist for financial management;Help with stairs or ramp for entrance;Supervision due to cognitive status     Functional Status Assessment   Patient has had a recent decline in their functional status and demonstrates the ability to make significant improvements in function in a reasonable and predictable amount of time.     Equipment Recommendations   None  recommended by OT     Recommendations for Other Services   Rehab consult     Precautions/Restrictions   Precautions Precautions: Fall Recall of Precautions/Restrictions: Impaired Restrictions Weight Bearing Restrictions Per Provider Order: No     Mobility Bed Mobility Overal bed mobility: Needs Assistance Bed Mobility: Supine to Sit, Sit to Supine Rolling: Max assist     Sit to supine: Max assist        Transfers       Balance Overall balance assessment: Needs assistance Sitting-balance support: Single extremity supported, Feet supported Sitting balance-Leahy Scale: Poor Sitting balance - Comments: Pt sat EOB with modA to maintain upright posture. Postural control: Left lateral lean, Posterior lean           ADL either performed or assessed with clinical judgement   ADL Overall ADL's : Needs assistance/impaired Eating/Feeding: Set up;Sitting   Grooming: Minimal assistance;Sitting   Upper Body Bathing: Moderate assistance;Sitting   Lower Body Bathing: Total assistance   Upper Body Dressing : Moderate assistance;Sitting   Lower Body Dressing: Total assistance   Toilet Transfer: Total assistance   Toileting- Clothing Manipulation and Hygiene: Total assistance       Functional mobility during ADLs: Maximal assistance General ADL Comments: pt required simple 1 step cues with repetition to initiate, sequence and sustain simple tasks     Vision Baseline Vision/History: 0 No visual deficits Vision Assessment?: Vision impaired- to be further tested in functional context Additional Comments: needs further assessment     Perception Perception: Impaired Preception Impairment Details: Inattention/Neglect     Praxis Praxis: Not tested       Pertinent Vitals/Pain Pain Assessment Pain  Assessment: Faces Faces Pain Scale: Hurts little more Pain Location: bottom Pain Descriptors / Indicators: Grimacing, Discomfort, Aching Pain Intervention(s):  Limited activity within patient's tolerance     Extremity/Trunk Assessment Upper Extremity Assessment Upper Extremity Assessment: LUE deficits/detail LUE Deficits / Details: no ativation at digits, 3/5 for elbow flexion/extension. paraesthesias throughout. poor proprioception LUE Sensation: decreased light touch;decreased proprioception LUE Coordination: decreased gross motor;decreased fine motor   Lower Extremity Assessment Lower Extremity Assessment: Defer to PT evaluation   Cervical / Trunk Assessment Cervical / Trunk Assessment: Other exceptions Cervical / Trunk Exceptions: Increased Body Habitus   Communication Communication Communication: No apparent difficulties   Cognition Arousal: Alert Behavior During Therapy: WFL for tasks assessed/performed Cognition: Cognition impaired   Orientation impairments: Situation Awareness: Online awareness impaired Memory impairment (select all impairments): Non-declarative long-term memory, Working memory Attention impairment (select first level of impairment): Sustained attention, Selective attention, Alternating attention, Divided attention Executive functioning impairment (select all impairments): Initiation, Organization, Sequencing, Reasoning, Problem solving OT - Cognition Comments: pt with poor attention, initiation and sequencing simple tasks. required maximal cues                 Following commands: Impaired Following commands impaired: Follows one step commands inconsistently     Cueing  General Comments   Cueing Techniques: Verbal cues  VSS on RA           Home Living Family/patient expects to be discharged to:: Private residence Living Arrangements: Spouse/significant other Available Help at Discharge: Family;Available 24 hours/day Type of Home: House Home Access: Stairs to enter [employer] of Steps: 3 Entrance Stairs-Rails: None Home Layout: Multi-level;Able to live on main level with  bedroom/bathroom;Full bath on main level     Bathroom Shower/Tub: Producer, television/film/video: Handicapped height Bathroom Accessibility: Yes   Home Equipment: Cane - single point;Standard Environmental consultant;Wheelchair - power;Grab bars - tub/shower;Shower seat - built in;Hand held shower head          Prior Functioning/Environment Prior Level of Function : Independent/Modified Independent;Driving             Mobility Comments: Ambulates without AD. 2 falls while on vacation in [place]. ADLs Comments: Indep with ADLs/IADLs. Driving. Retired.    OT Problem List: Decreased strength;Decreased range of motion;Decreased activity tolerance;Impaired balance (sitting and/or standing);Decreased cognition;Decreased safety awareness;Decreased knowledge of use of DME or AE;Decreased knowledge of precautions;Pain;Impaired UE functional use   OT Treatment/Interventions: Self-care/ADL training;Therapeutic exercise;Neuromuscular education;DME and/or AE instruction;Therapeutic activities;Patient/family education;Balance training      OT Goals(Current goals can be found in the care plan section)   Acute Rehab OT Goals Patient Stated Goal: to rest OT Goal Formulation: With patient Time For Goal Achievement: 05/23/24 Potential to Achieve Goals: Good ADL Goals Pt Will Perform Grooming: with supervision Pt Will Perform Upper Body Dressing: with supervision Pt Will Perform Lower Body Dressing: with max assist;sit to/from stand Pt Will Transfer to Toilet: with max assist;stand pivot transfer;bedside commode Additional ADL Goal #1: pt will complete bed mobility with mod A as a precursor to ADLs Additional ADL Goal #2: Pt will follow 100% of simple 1 step commands during ADL task   OT Frequency:  Min 2X/week       AM-PAC OT 6 Clicks Daily Activity     Outcome Measure Help from another person eating meals?: A Little Help from another person taking care of personal grooming?: A Little Help from  another person toileting, which includes using toliet, bedpan, or urinal?: Total Help from  another person bathing (including washing, rinsing, drying)?: A Lot Help from another person to put on and taking off regular upper body clothing?: A Lot Help from another person to put on and taking off regular lower body clothing?: Total 6 Click Score: 12   End of Session Nurse Communication: Mobility status  Activity Tolerance: Patient tolerated treatment well Patient left: in bed;with call bell/phone within reach;with bed alarm set  OT Visit Diagnosis: Unsteadiness on feet (R26.81);Other abnormalities of gait and mobility (R26.89);Muscle weakness (generalized) (M62.81);Hemiplegia and hemiparesis Hemiplegia - Right/Left: Left Hemiplegia - dominant/non-dominant: Non-Dominant Hemiplegia - caused by: Cerebral infarction                Time: 1400-1420 OT Time Calculation (min): 20 min Charges:  OT General Charges $OT Visit: 1 Visit OT Evaluation $OT Eval Moderate Complexity: 1 Mod  Lucie Kendall, OTR/L Acute Rehabilitation Services Office [phone] Secure Chat Communication Preferred   Lucie JONETTA Kendall 05/09/2024, 4:21 PM

## 2024-05-09 NOTE — Progress Notes (Addendum)
 PROGRESS NOTE        PATIENT DETAILS Name: Darren Hunt Age: 73 y.o. Sex: male Date of Birth: 1950-12-10 Admit Date: 05/07/2024 Admitting Physician Micaela Speaker, MD ERE:Rympdrn, Salomon, FNP  Brief Summary: Patient is a 73 y.o.  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  Hospital course complicated by left popliteal DVT and small volume PE.  Family signed him out of hospital in [place] -hired private ambulance and brought him to [facility] for a second opinion..  Significant events: 10/11>> admit to [facility].  Significant studies: 10/11>> CT head: Large right MCA territory infarct with petechial hemorrhage.  Small volume SAH. 10/11>> A1c: 6.7 10/11>> LDL: 25 10/12>> CT angio head/neck: No LVO, patent left carotid vascular stent 10/12>> MRI brain: Evolving right MCA territory infarct with extensive petechial hemorrhage-overlying SAH-worse than prior imaging.  10/13>> echo: Pending  Significant microbiology data: None  Procedures: None  Consults: Neurology  Subjective: Lying comfortably in bed-denies any chest pain or shortness of breath.  Speech is slow but clear.  Objective: Vitals: Blood pressure (!) 125/58, pulse 79, temperature 98.7 F (37.1 C), temperature source Oral, resp. rate 15, height 6' 1 (1.854 m), weight (!) 149.7 kg, SpO2 95%.   Exam: Gen Exam:Alert awake-not in any distress HEENT:atraumatic, normocephalic Chest: B/L clear to auscultation anteriorly CVS:S1S2 regular + systolic murmur 3/6. Abdomen:soft non tender, non distended Extremities:no edema Neurology: LUE-around 2-3/5, LLE: Around 4/5. Skin: no rash  Pertinent Labs/Radiology:    Latest Ref Rng & Units  05/08/2024    5:12 AM 05/07/2024    8:01 PM  CBC  WBC 4.0 - 10.5 K/uL 9.7  10.1   Hemoglobin 13.0 - 17.0 g/dL 85.1  83.8   Hematocrit 39.0 - 52.0 % 43.4  47.4   Platelets 150 - 400 K/uL 271  284     Lab Results  Component Value Date   NA 137 05/08/2024   K 3.6 05/08/2024   CL 100 05/08/2024   CO2 26 05/08/2024      Assessment/Plan: Acute ischemic CVA with petechial hemorrhage Suspected etiology-atherosclerotic large vessel disease Continues to have significant left-sided hemiparesis- LUE> LLE Workup as above On Pradaxa-due to recent VTE-Will stop aspirin given petechial hemorrhage/SAH. Await PT/OT eval Has oropharyngeal dysphagia related to CVA-await SLP eval but seems to be tolerating dysphagia 3 diet well. Disposition is to [facility] inpatient rehab in [place] when workup is complete.  SAH Clinically unchanged but MRI brain on 10/11 shows worsening SAH. Discussed with stroke MD Dr Rankin further Pradaxa-get urgent CT head and reassess.  Addendum CT head done earlier this morning reviewed with Dr. Lowanda obvious SAH evident on this reading but has more midline shift, and more intraparenchymal/petechial hemorrhage.  Hold further Pradaxa-repeat CT head tomorrow.  Per my preliminary discussion-no anticoagulation or antiplatelets plan until patient follows up at the stroke clinic after discharge.  Left popliteal DVT/PE right lower lobe Likely provoked. On Pradaxa-currently held-due to concern for worsening SAH Per chart review-he has had prior VTE-probably will require lifelong anticoagulation S/p IVC filter placement at outside facility on 10/8 (per prior notes-this is MRI compatible/retrievable IVC filter)  History  of CAD s/p left carotid stenting 2014 Stent appears patent on CTA On statin Unable to use antiplatelets-given potential hemorrhage associated with CVA and SAH.  History of CAD-s/p CTO 2013 Currently stable-no anginal symptoms Continue statin No  antiplatelets-see above.  History of moderate aortic stenosis Has systolic murmur-awaiting updated echo.  HTN BP stable Continue furosemide /losartan  HLD Statin  DM-2 CBG stable Lantus 10 units daily +SSI  Recent Labs    05/08/24 2026 05/08/24 2130 05/09/24 0759  GLUCAP 166* 145* 154*     COPD on 3 L of oxygen nocturnally Chronic hypoxic respiratory failure Stable-not in exacerbation Continue bronchodilators.  OSA-CPAP at home  Mood disorder Appears relatively stable Continue Prozac/Cymbalta/amantadine.  History of iliac/abdominal aortic aneurysm Chronic issue-continue outpatient surveillance with PCP/vascular surgery.  Pressure Ulcer: Agree with assessment and plan as outlined below. Wound 05/08/24 1009 Pressure Injury Coccyx Stage 2 -  Partial thickness loss of dermis presenting as a shallow open injury with a red, pink wound bed without slough. (Active)     Wound 05/08/24 1048 Pressure Injury Buttocks Left Deep Tissue Pressure Injury - Purple or maroon localized area of discolored intact skin or blood-filled blister due to damage of underlying soft tissue from pressure and/or shear. (Active)    Class III obesity: Estimated body mass index is 43.54 kg/m as calculated from the following:   Height as of this encounter: 6' 1 (1.854 m).   Weight as of this encounter: 149.7 kg.   Code status:   Code Status: Full Code   DVT Prophylaxis: Place and maintain sequential compression device Start: 05/08/24 0624 Place TED hose Start: 05/08/24 0624 SCDs Start: 05/07/24 2005 Place TED hose Start: 05/07/24 2005 dabigatran (PRADAXA) capsule 150 mg     Family Communication: Spouse-[name] [phone] updated 10/13   Disposition Plan: Status is: Observation The patient will require care spanning > 2 midnights and should be moved to inpatient because: Severity of illness   Planned Discharge Destination:Rehabilitation facility   Diet: Diet Order              DIET DYS 3 Room service appropriate? Yes; Fluid consistency: Thin  Diet effective now                     Antimicrobial agents: Anti-infectives (From admission, onward)    None        MEDICATIONS: Scheduled Meds:  amantadine  200 mg Oral BID   aspirin EC  81 mg Oral Daily   atorvastatin  40 mg Oral Daily   dabigatran  150 mg Oral Q12H   DULoxetine  30 mg Oral Daily   FLUoxetine  20 mg Oral Daily   furosemide   40 mg Oral Daily   insulin aspart  0-5 Units Subcutaneous QHS   insulin aspart  0-6 Units Subcutaneous TID WC   insulin glargine  10 Units Subcutaneous Q2200   LORazepam  1 mg Intravenous Once   losartan  50 mg Oral Daily   pantoprazole  40 mg Oral Daily   polyethylene glycol  17 g Oral Daily   sodium chloride  flush  3 mL Intravenous Q12H   sodium chloride  flush  3 mL Intravenous Q12H   umeclidinium bromide  1 puff Inhalation Daily   Continuous Infusions: PRN Meds:.acetaminophen **OR** acetaminophen, HYDROcodone-acetaminophen, ipratropium-albuterol , ondansetron  **OR** ondansetron  (ZOFRAN ) IV, sodium chloride  flush   I have personally reviewed following labs and imaging studies  LABORATORY DATA: CBC: Recent Labs  Lab 05/07/24 2001 05/08/24 0512  WBC 10.1 9.7  NEUTROABS 7.1  --   HGB 16.1 14.8  HCT 47.4 43.4  MCV 90.8 90.4  PLT 284 271    Basic Metabolic Panel: Recent Labs  Lab 05/07/24 2001 05/08/24 0512  NA 137 137  K 4.5 3.6  CL 95* 100  CO2 29 26  GLUCOSE 164* 155*  BUN 21 21  CREATININE 0.98 0.85  CALCIUM 9.2 8.1*    GFR: Estimated Creatinine Clearance: 119.8 mL/min (by C-G formula based on SCr of 0.85 mg/dL).  Liver Function Tests: Recent Labs  Lab 05/07/24 2001 05/08/24 0512  AST 40 32  ALT 38 31  ALKPHOS 55 48  BILITOT 2.2* 1.7*  PROT 7.0 5.8*  ALBUMIN 3.2* 2.6*   No results for input(s): LIPASE, AMYLASE in the last 168 hours. No results for input(s): AMMONIA in the last 168 hours.  Coagulation  Profile: No results for input(s): INR, PROTIME in the last 168 hours.  Cardiac Enzymes: No results for input(s): CKTOTAL, CKMB, CKMBINDEX, TROPONINI in the last 168 hours.  BNP (last 3 results) No results for input(s): PROBNP in the last 8760 hours.  Lipid Profile: Recent Labs    05/08/24 0512  CHOL 62  HDL 22*  LDLCALC 25  TRIG 74  CHOLHDL 2.8    Thyroid Function Tests: No results for input(s): TSH, T4TOTAL, FREET4, T3FREE, THYROIDAB in the last 72 hours.  Anemia Panel: No results for input(s): VITAMINB12, FOLATE, FERRITIN, TIBC, IRON, RETICCTPCT in the last 72 hours.  Urine analysis: No results found for: COLORURINE, APPEARANCEUR, LABSPEC, PHURINE, GLUCOSEU, HGBUR, BILIRUBINUR, KETONESUR, PROTEINUR, UROBILINOGEN, NITRITE, LEUKOCYTESUR  Sepsis Labs: Lactic Acid, Venous No results found for: LATICACIDVEN  MICROBIOLOGY: No results found for this or any previous visit (from the past 240 hours).  RADIOLOGY STUDIES/RESULTS: MR BRAIN WO CONTRAST Result Date: 05/08/2024 EXAM: MR Brain without Intravenous Contrast. CLINICAL HISTORY: Stroke, follow up. TECHNIQUE: Magnetic resonance images of the brain without intravenous contrast in multiple planes. CONTRAST: Without. COMPARISON: None provided. FINDINGS: BRAIN: Evolving right MCA territory infarct with extensive petechial hemorrhage and overlying subarachnoid hemorrhage. The extent of the infarct is similar. The amount of overlying subarachnoid hemorrhage appears increased since CT head on May 07, 2024. Similar mass effect. The central arterial and venous flow voids are patent. VENTRICLES: No hydrocephalus. ORBITS: The orbits are normal. SINUSES AND MASTOIDS: Right maxillary sinus retention cyst.  Mastoid air cells are clear. BONES: No acute fracture or focal osseous lesion. IMPRESSION: 1. Evolving right MCA territory infarct with extensive petechial hemorrhage. The extent  of the infarct and mass effect is similar. 2. Overlying subarachnoid hemorrhage appears increased since CT head on May 07, 2024. A repeat CT head could provide more direct comparison if clinically warranted. Electronically signed by: Gilmore Molt MD 05/08/2024 07:40 PM EDT RP Workstation: [id number]   CT ANGIO HEAD NECK W WO CM Result Date: 05/08/2024 EXAM: CTA Head and Neck with Intravenous Contrast. CT Head without Contrast. CLINICAL HISTORY: 73 year old male with stroke/TIA, determine embolic source. Stroke on 09/28. Transfer from outside facility. TECHNIQUE: Axial CTA images of the head and neck performed with intravenous contrast. MIP reconstructed images were created and reviewed. Axial computed tomography images of the head/brain performed without intravenous contrast. Note: Per PQRS, the description of internal carotid artery percent stenosis, including 0 percent or normal exam, is based on [place] American Symptomatic Carotid Endarterectomy Trial (NASCET) criteria. Dose reduction technique was used including one or more of the following: automated exposure control, adjustment of mA and kV according to  patient size, and/or iterative reconstruction. CONTRAST: Without and with IV contrast. 75 mL iohexol (OMNIPAQUE) 350 MG/ML injection. COMPARISON: Head CT 05/07/2024. FINDINGS: CT HEAD: BRAIN: Confluent hypodense edema in the right hemisphere, right MCA territory with scattered subcentimeter petechial hyperdensity suspicious for petechial hemorrhage (such as series 6 image 20). Mass effect on the right lateral ventricle with only trace leftward midline shift. There are areas of cortical involvement, although some of the edema pattern is reminiscent of vasogenic edema. Stable gray and white matter differentiation since yesterday. Basilar cisterns remain patent. No new or progressive intracranial hemorrhage. No gaze deviation. No extra-axial collection. VENTRICLES: Mass effect on the right lateral ventricle  with only trace leftward midline shift. No hydrocephalus. ORBITS: The orbits are unremarkable. SINUSES AND MASTOIDS: The paranasal sinuses and mastoid air cells are clear. CTA NECK: COMMON CAROTID ARTERIES: Widespread atherosclerosis of the right CCA and proximal right ICA but no hemodynamically significant stenosis in the neck. Left CCA atherosclerosis without stenosis. No dissection or occlusion. INTERNAL CAROTID ARTERIES: Widespread atherosclerosis of the right CCA and proximal right ICA but no hemodynamically significant stenosis in the neck. Right ICA siphon is patent with moderate calcified atherosclerosis and moderate cavernous segment stenosis. Patent vascular stent of the distal left CCA continuing through the left ICA to the C1-C2 vertebral level. No high grade incident stenosis suspected by CTA. Left ICA siphon is patent with moderate calcified atherosclerosis and moderate stenosis of the left anterior genu. No dissection or occlusion. VERTEBRAL ARTERIES: Calcified atherosclerosis of the proximal right subclavian artery also affects the right vertebral artery origin with moderate stenosis. Right vertebral artery is then patent to the vertebrobasilar junction without additional stenosis. Proximal left subclavian atherosclerosis also affects the left vertebral artery origin which is moderately to severely stenotic on series 13 image 194. Codominant left vertebral artery then is patent with no additional stenosis to the vertebrobasilar junction. No dissection or occlusion. CTA HEAD: ANTERIOR CEREBRAL ARTERIES: Dominant right A1 segment. Normal anterior communicating artery. Dominant right ACA throughout. ACA branches are within normal limits. No significant stenosis. No occlusion. No aneurysm. MIDDLE CEREBRAL ARTERIES: Left MCA bifurcates early without stenosis. Left MCA branches are within normal limits. Right MCA M1 segment and bifurcation are patent. No M1 stenosis. But proximal MCA M2 branch irregularity  and stenosis is up to moderate on series 17 image 15. No discrete branch occlusion is identified. Right MCA branches are generally attenuated compared to the left side (series 15 image 18). No aneurysm. POSTERIOR CEREBRAL ARTERIES: Patent PCA origins. Bilateral PCA branches are patent without stenosis. No significant stenosis. No occlusion. No aneurysm. BASILAR ARTERY: Patent basilar artery without stenosis. No significant stenosis. No occlusion. No aneurysm. OTHER: Superior sagittal sinus is enhancing and patent. Both PICA origins are patent. Patent SCA origins. Posterior communicating arteries are diminutive or absent. Aortic arch atherosclerosis. 3 vessel arch configuration. Proximal great vessel atherosclerosis. SOFT TISSUES: Negative visible upper chest. Negative visible nonvascular neck soft tissue spaces. No masses or lymphadenopathy. BONES: Widespread advanced disc and endplate degeneration in the visible spine. No acute osseous abnormality. IMPRESSION: 1. Negative for large vessel occlusion but positive for branches stenoses at the Right bifurcation and generally attenuated RMCA branches compared to the left. 2. Widespread atherosclerosis in the head and neck. Patent Left Carotid vascular stent. Notable stenoses: Left vertebral artery origin (Moderate to Severe), Both ICA siphons (Moderate). Right vertebral artery origin (Moderate). 3. Right MCA territory confluent edema with scattered petechial hemorrhage and mild mass effect stable from Head CT yesterday.  Trace leftward midline shift. No malignant hemorrhagic transformation or new intracranial abnormality. Electronically signed by: Helayne Hurst MD 05/08/2024 09:34 AM EDT RP Workstation: [id number]   CT HEAD WO CONTRAST ( ) Result Date: 05/07/2024 EXAM: CT HEAD WITHOUT CONTRAST 05/07/2024 09:32:31 PM TECHNIQUE: CT of the head was performed without the administration of intravenous contrast. Automated exposure control, iterative reconstruction, and/or  weight based adjustment of the mA/kV was utilized to reduce the radiation dose to as low as reasonably achievable. COMPARISON: Outside CT head May 05, 2024. CLINICAL HISTORY: History of acute CVA status post TNK afterward developed microhemorrhage on 9/28. FINDINGS: BRAIN AND VENTRICLES: In comparison to outside CT head from 05/05/2024 no substantial change in large right MCA territory infarct with petechial hemorrhage and overlying small volume of subarachnoid hemorrhage. Similar 4 mm leftward midline shift. No hydrocephalus. No extra-axial collection. No mass effect or midline shift. ORBITS: No acute abnormality. SINUSES: Right maxillary sinus retention cyst. SOFT TISSUES AND SKULL: No skull fracture. IMPRESSION: 1. In comparison to outside CT head from 05/05/2024, no substantial change in large right MCA territory infarct with petechial hemorrhage and overlying small volume of subarachnoid hemorrhage. 2. Similar 4 mm leftward midline shift. Electronically signed by: Gilmore Molt MD 05/07/2024 09:54 PM EDT RP Workstation: [id number]   CT OUTSIDE FILMS HEAD/FACE Result Date: 05/07/2024 This examination belongs to an outside facility and is stored here for comparison purposes only.  Contact the originating outside institution for any associated report or interpretation.  DG Chest 1 View Result Date: 05/07/2024 CLINICAL DATA:  Hypoxia EXAM: CHEST  1 VIEW COMPARISON:  05/12/2005 FINDINGS: Heart and mediastinal contours are within normal limits. No focal opacities or effusions. No acute bony abnormality. Aortic atherosclerosis. IMPRESSION: No active disease. Electronically Signed   By: Franky Crease M.D.   On: 05/07/2024 20:09     LOS: 1 day   Donalda Applebaum, MD  [facility]    To contact the attending provider between 7A-7P or the covering provider during after hours 7P-7A, please log into the web site [email or web address] and access using universal [employer] password for that web site. If you do  not have the password, please call the hospital operator.  05/09/2024, 8:24 AM

## 2024-05-09 NOTE — Plan of Care (Signed)

## 2024-05-09 NOTE — Progress Notes (Signed)
 Patient refused Cpap. Will be on standby

## 2024-05-09 NOTE — Progress Notes (Signed)
 STROKE TEAM PROGRESS NOTE    SIGNIFICANT HOSPITAL EVENTS   10/11: Arrived to [facility] via private ambulance from [place] after having stroke imaging and workup detailed below, imaging also noted left popliteal DVT along with a small volume PE in the right lower lobe on the CT of the chest. IVC filter placed 10/8. Patient  developed having sudden onset left-sided weakness and left vision loss and was taken to outside hospital where he received thrombolytics. Post TNK, noted to have developed a R posterior MCA stroke with petechial hemorrhage and overlying smal SAH.  CT head tonight with right Posterior MCA distribution infarct with 4 mm midline shift that appears to be stable compared to [facility] from outside facility that was obtained on 05/05/2024.  CT head also demonstrates petechial hemorrhage with small overlying subarachnoid hemorrhage.  Wife did bring images on disk, but these are not viewable. Pending getting these uploaded.   INTERIM HISTORY/SUBJECTIVE No family at bedside.  Patient lying bed, neuro stable, still has left hemiparesis and left facial droop.  However, CT showed new right MCA territory small multifocal hemorrhagic transformation, will DC Pradaxa.  OBJECTIVE  CBC    Component Value Date/Time   WBC 9.7 05/08/2024 0512   RBC 4.80 05/08/2024 0512   HGB 14.8 05/08/2024 0512   HCT 43.4 05/08/2024 0512   PLT 271 05/08/2024 0512   MCV 90.4 05/08/2024 0512   MCH 30.8 05/08/2024 0512   MCHC 34.1 05/08/2024 0512   RDW 13.2 05/08/2024 0512   LYMPHSABS 1.9 05/07/2024 2001   MONOABS 0.8 05/07/2024 2001   EOSABS 0.2 05/07/2024 2001   BASOSABS 0.1 05/07/2024 2001    BMET    Component Value Date/Time   NA 137 05/08/2024 0512   K 3.6 05/08/2024 0512   CL 100 05/08/2024 0512   CO2 26 05/08/2024 0512   GLUCOSE 155 (H) 05/08/2024 0512   BUN 21 05/08/2024 0512   CREATININE 0.85 05/08/2024 0512   CALCIUM 8.1 (L) 05/08/2024 0512   GFRNONAA >60 05/08/2024 0512    IMAGING past 24  hours ECHOCARDIOGRAM LIMITED Result Date: 05/09/2024    ECHOCARDIOGRAM LIMITED REPORT   Patient Name:   Darren Hunt Date of Exam: 05/09/2024 Medical Rec #:  [id number]     Height:       73.0 in Accession #:    [id number]    Weight:       330.0 lb Date of Birth:  02/29/1951    BSA:          2.662 m Patient Age:    73 years      BP:           131/51 mmHg Patient Gender: M             HR:           79 bpm. Exam Location:  Inpatient Procedure: Limited Echo, Limited Color Doppler, Cardiac Doppler and RV Strain            Analysis (Both Spectral and Color Flow Doppler were utilized during            procedure). Indications:    Stroke I63.9, Pulmonary Embolus  History:        Patient has prior history of Echocardiogram examinations, most                 recent 02/18/2024. CAD, Pulmonary Embolus and Stroke, Aortic  Valve Disease; Risk Factors:Hypertension, Dyslipidemia, Diabetes                 and Sleep Apnea.  Sonographer:    Koleen Popper RDCS Referring Phys: SUBRINA SUNDIL IMPRESSIONS  1. LIMITED ECHO.  2. Left ventricular ejection fraction, by estimation, is 55 to 60%. The left ventricle has normal function. The left ventricular internal cavity size was mildly dilated.  3. Right ventricular systolic function is normal. The right ventricular size is normal. Tricuspid regurgitation signal is inadequate for assessing PA pressure.  4. The mitral valve is degenerative. No evidence of mitral valve regurgitation. No evidence of mitral stenosis.  5. The aortic valve is tricuspid. Aortic valve regurgitation is mild. Moderate aortic valve stenosis.  6. The inferior vena cava is normal in size with greater than 50% respiratory variability, suggesting right atrial pressure of 3 mmHg. Comparison(s): A prior study was performed on 02/18/2024. LVEF 60-65%, Grade I diastolic dysfunction, mild LAE, moderate MAC, Moderate AS, estimated RAP 3 mmHG. Conclusion(s)/Recommendation(s): No intracardiac source of embolism  detected on this transthoracic study. Consider a transesophageal echocardiogram to exclude cardiac source of embolism if clinically indicated. FINDINGS  Left Ventricle: Left ventricular ejection fraction, by estimation, is 55 to 60%. The left ventricle has normal function. The left ventricular internal cavity size was mildly dilated. There is borderline left ventricular hypertrophy. Right Ventricle: The right ventricular size is normal. No increase in right ventricular wall thickness. Right ventricular systolic function is normal. Tricuspid regurgitation signal is inadequate for assessing PA pressure. Left Atrium: Left atrial size was normal in size. Pericardium: There is no evidence of pericardial effusion. Presence of epicardial fat layer. Mitral Valve: The mitral valve is degenerative in appearance. Mild to moderate mitral annular calcification. No evidence of mitral valve stenosis. Aortic Valve: The aortic valve is tricuspid. Aortic valve regurgitation is mild. Moderate aortic stenosis is present. Aortic valve mean gradient measures 32.0 mmHg. Aortic valve peak gradient measures 49.6 mmHg. Aortic valve area, by VTI measures 2.34 cm. Aorta: The aortic root is normal in size and structure. Venous: The inferior vena cava is normal in size with greater than 50% respiratory variability, suggesting right atrial pressure of 3 mmHg. IAS/Shunts: The atrial septum is grossly normal. Additional Comments: Spectral Doppler performed. Color Doppler performed.  LEFT VENTRICLE PLAX 2D LVIDd:         6.20 cm   Diastology LVIDs:         4.10 cm   LV e' medial:    5.22 cm/s LV PW:         1.20 cm   LV E/e' medial:  13.6 LV IVS:        1.30 cm   LV e' lateral:   8.81 cm/s LVOT diam:     2.30 cm   LV E/e' lateral: 8.1 LV SV:         165 LV SV Index:   62 LVOT Area:     4.15 cm  RIGHT VENTRICLE             IVC RV Basal diam:  4.20 cm     IVC diam: 1.90 cm RV Mid diam:    3.80 cm RV S prime:     28.10 cm/s TAPSE (M-mode): 2.4 cm  LEFT ATRIUM           Index LA diam:      4.30 cm 1.62 cm/m LA Vol (A4C): 70.2 ml 26.37 ml/m  AORTIC VALVE AV Area (Vmax):  2.01 cm AV Area (Vmean):   1.98 cm AV Area (VTI):     2.34 cm AV Vmax:           352.00 cm/s AV Vmean:          269.000 cm/s AV VTI:            0.707 m AV Peak Grad:      49.6 mmHg AV Mean Grad:      32.0 mmHg LVOT Vmax:         170.00 cm/s LVOT Vmean:        128.000 cm/s LVOT VTI:          0.398 m LVOT/AV VTI ratio: 0.56  AORTA Ao Root diam: 3.40 cm MITRAL VALVE MV Area (PHT): 3.48 cm     SHUNTS MV Decel Time: 218 msec     Systemic VTI:  0.40 m MV E velocity: 71.00 cm/s   Systemic Diam: 2.30 cm MV A velocity: 106.00 cm/s MV E/A ratio:  0.67 Sunit Tolia Electronically signed by Madonna Large Signature Date/Time: 05/09/2024/1:34:31 PM    Final    CT HEAD WO CONTRAST ( ) Result Date: 05/09/2024 EXAM: CT HEAD WITHOUT CONTRAST 05/09/2024 10:13:18 AM TECHNIQUE: CT of the head was performed without the administration of intravenous contrast. Automated exposure control, iterative reconstruction, and/or weight based adjustment of the mA/kV was utilized to reduce the radiation dose to as low as reasonably achievable. COMPARISON: Brain MRI 05/08/2024, Head CT 05/08/2024, and earlier head CTs. CLINICAL HISTORY: 73 year old male. Stroke, follow up. Possible increased subarachnoid blood on brain MRI 05/08/2024. FINDINGS: BRAIN AND VENTRICLES: Right MCA infarct with extensive petechial hemorrhage. Ongoing fairly extensive heterogeneous edema in the right hemisphere. Mass effect on the right lateral ventricle has not significantly changed, but leftward midline shift up to 6 mm is progressed since 05/07/2024 (4 mm at that time). Basilar cisterns remain patent. Increased confluence and density of multifocal petechial hemorrhage in the infarcted right MCA territory (series 3, image 65) but overall no malignant type of hemorrhagic transformation at this time. No discrete subarachnoid blood by CT. No  intraventricular blood. No ventriculomegaly. Other vascular territory gray white differentiation remains maintained. Calcified atherosclerosis. No extra-axial collection. ORBITS: No acute abnormality. SINUSES: Stable mild paranasal sinus mucosal thickening. SOFT TISSUES AND SKULL: No acute soft tissue abnormality. No skull fracture. IMPRESSION: 1. Progressed leftward midline shift now 6 mm vs 4 mm at presentation. 2. Right MCA territory infarct with increased confluence and density of extensive petechial hemorrhage since presentation. But no malignant hemorrhagic transformation. 3. No discrete subarachnoid hemorrhage by CT. Electronically signed by: Helayne Hurst MD 05/09/2024 10:38 AM EDT RP Workstation: [id number]   MR BRAIN WO CONTRAST Result Date: 05/08/2024 EXAM: MR Brain without Intravenous Contrast. CLINICAL HISTORY: Stroke, follow up. TECHNIQUE: Magnetic resonance images of the brain without intravenous contrast in multiple planes. CONTRAST: Without. COMPARISON: None provided. FINDINGS: BRAIN: Evolving right MCA territory infarct with extensive petechial hemorrhage and overlying subarachnoid hemorrhage. The extent of the infarct is similar. The amount of overlying subarachnoid hemorrhage appears increased since CT head on May 07, 2024. Similar mass effect. The central arterial and venous flow voids are patent. VENTRICLES: No hydrocephalus. ORBITS: The orbits are normal. SINUSES AND MASTOIDS: Right maxillary sinus retention cyst.  Mastoid air cells are clear. BONES: No acute fracture or focal osseous lesion. IMPRESSION: 1. Evolving right MCA territory infarct with extensive petechial hemorrhage. The extent of the infarct and mass effect is similar. 2. Overlying subarachnoid hemorrhage appears increased  since CT head on May 07, 2024. A repeat CT head could provide more direct comparison if clinically warranted. Electronically signed by: Gilmore Molt MD 05/08/2024 07:40 PM EDT RP Workstation: [id number]     Vitals:   05/09/24 0337 05/09/24 0700 05/09/24 1100 05/09/24 1624  BP: 100/75 (!) 125/58 136/71 129/63  Pulse:      Resp: 15     Temp: 97.8 F (36.6 C) 98.7 F (37.1 C) 98.6 F (37 C) 98.5 F (36.9 C)  TempSrc: Oral Oral Oral Oral  SpO2: 95%     Weight:      Height:         PHYSICAL EXAM General:  Alert, well-nourished, well-developed patient in no acute distress Psych:  Mood and affect appropriate for situation CV: Regular rate and rhythm on monitor Respiratory:  Regular, unlabored respirations on room air GI: Abdomen soft and nontender   NEURO:  Mental Status: Drowsy, awakens to voice.  Awake alert oriented x 3. Speech/Language: speech is without dysarthria or aphasia.  Naming, repetition, fluency, and comprehension intact--paucity of speech.  Follows simple commands.  Cranial Nerves:  II: PERRL. Visual fields full.  III, IV, VI: Right gaze preference but does cross midline unable to have complete and held left gaze V: Sensation is intact to light touch and symmetrical to face.  VII: Left facial droop VIII: hearing intact to voice. IX, X: Palate elevates symmetrically. Phonation is normal.  KP:Dynloizm shrug 5/5. XII: tongue is midline without fasciculations. Motor:  LUE: 3-/5, 2/5 grip. Immediate drift.  LLE: 3/5, 4+/5 plantar foot flexion. Immediate drift.  Tone: is normal and bulk is normal Sensation-decreased on left Coordination: FTN intact on right, unable to perform on left. Gait- deferred  Most Recent NIH 8     ASSESSMENT/PLAN  Mr. CASTOR GITTLEMAN is a 73 y.o. male with history of HTN, DM2, HLD, OSA, morbid obesity, COPD, CAD, DVTs, PEs who developed sudden onset left-sided weakness, left vision loss while vacationing in [place] .  Was given TNK 9/28.  Post TNK developed right posterior MCA stroke with petechial hemorrhage and overlying small SAH.  Workup there also revealed left popliteal DVT and small volume PE.  IVC filter was placed 10/8.   Patient was Transported by privately hired ambulance to [facility], arrived 10/11.NIH on Admission10  Stroke:  right MCA infarct s/p TNK with subsequent petechial hemorrhage, etiology:  large vessel disease, extensive atherosclerosis  9/28 presented to OSH for R MCA infarct-->treated with TNK Post-TNK imaging showed petechial hemorrhage and subarachnoid hemorrhage Now back to [facility] for continued management  CT head 10/11 In comparison to outside CT head from 05/05/2024, no substantial change in large right MCA territory infarct with petechial hemorrhage and overlying small volume of subarachnoid hemorrhage. Similar 4 mm leftward midline shift. CTA head & neck: branch stenosis at right MCA bifurcation, widespread atherosclerosis in head and neck.  Patent left carotid vascular stent. Notable stenosis: Left vertebral artery origin, both ICA siphons, right vertebral artery origin MRI Evolving right MCA territory infarct with extensive petechial hemorrhage. The extent of the infarct and mass effect is similar. Overlying subarachnoid hemorrhage appears increased comparing with last CT CT repeat Progressed leftward midline shift now 6 mm vs 4 mm at presentation. Right MCA territory infarct with increased confluence and density of extensive petechial hemorrhage since presentation. CT repeat in a.m. 2D Echo EF 55-60% LDL 25 HgbA1c 6.7 VTE prophylaxis - lovenox aspirin 325 mg daily prior to admission, now on no thrombotic given extensive petechial  hemorrhage and small intraparenchymal hemorrhage Therapy recommendations:  CIR Disposition:  pending  Recent left popliteal DVT, recent small volume PE right lower lobe Likely DVT post stroke due to immobility  IVC filter placed at OSH Now on pradaxa given stroke has been 2 weeks and pt allergic to heparin   Hypertension Home meds:  losartan 100mg  daily, hydrochlorothiazide daily, lasix  20mg  daily Stable BP goal < 160 Avoid hypotension due to severe  atherosclerosis  Long term BP goal normotensive  Hyperlipidemia Home meds:  zetia  10 LDL 25, goal < 70 Now on lipitor 40 Continue at discharge  Diabetes type II Controlled Home meds:  metformin, insulin HgbA1c 6.7, goal < 7.0 CBGs SSI Recommend close follow-up with PCP for better DM control  Tobacco Abuse Patient smokes 2 packs per day        Ready to quit? Yes Nicotine replacement therapy provided  Dysphagia Patient has post-stroke dysphagia, SLP consulted On dys 3 diet and thin liquuid Advance diet as tolerated  Other Stroke Risk Factors Obesity, Body mass index is 43.54 kg/m., BMI >/= 30 associated with increased stroke risk, recommend weight loss, diet and exercise as appropriate  Coronary artery disease Obstructive sleep apnea, on CPAP at home  Other Active Problems   Hospital day # 1    Ary Cummins, MD PhD Stroke Neurology 05/09/2024 5:24 PM   To contact Stroke Continuity provider, please refer to [email or web address]. After hours, contact General Neurology

## 2024-05-09 NOTE — Progress Notes (Signed)
*  PRELIMINARY RESULTS* Echocardiogram 2D Echocardiogram has been performed.  Koleen KANDICE Popper, RDCS 05/09/2024, 8:17 AM

## 2024-05-09 NOTE — Telephone Encounter (Signed)
 Patient Product/process development scientist completed.    The patient is insured through [employer]. Patient has Medicare and is not eligible for a copay card, but may be able to apply for patient assistance or Medicare RX Payment Plan (Patient Must reach out to their plan, if eligible for payment plan), if available.    Ran test claim for dabigatran (Pradaxa) 150 mg and the current 30 day co-pay is $0.00.   This test claim was processed through [facility]- copay amounts may vary at other pharmacies due to pharmacy/plan contracts, or as the patient moves through the different stages of their insurance plan.     Reyes Sharps, CPHT Pharmacy Technician Patient Advocate Specialist Lead [facility] Patient Advocate Team Direct Number: [phone]  Fax: [phone]

## 2024-05-09 NOTE — TOC Initial Note (Signed)
 Transition of Care [facility]) - Initial/Assessment Note    Patient Details  Name: Darren Hunt MRN: [id number] Date of Birth: 20-Jul-1951  [facility]) CM/SW Contact:    Lauraine FORBES Saa, LCSWA Phone Number: 05/09/2024, 10:24 AM  Clinical Narrative:                  10:24 AM Per MD, patient is anticipated to discharge tomorrow pending CT. RNCM, bedside RN, and [facility] made aware. TOC will continue to follow.  Expected Discharge Plan: IP Rehab Facility Barriers to Discharge: Continued Medical Work up, English as a second language teacher   Patient Goals and CMS Choice            Expected Discharge Plan and Services   Discharge Planning Services: CM Consult Post Acute Care Choice: IP Rehab Living arrangements for the past 2 months: Single Family Home                                      Prior Living Arrangements/Services Living arrangements for the past 2 months: Single Family Home Lives with:: Spouse Patient language and need for interpreter reviewed:: Yes        Need for Family Participation in Patient Care: Yes (Comment) Care giver support system in place?: Yes (comment) Current home services: DME Criminal Activity/Legal Involvement Pertinent to Current Situation/Hospitalization: No - Comment as needed  Activities of Daily Living      Permission Sought/Granted Permission sought to share information with : Facility Medical sales representative, Family Supports Permission granted to share information with : No (Contact information on chart)  Share Information with NAME: Norma Das  Permission granted to share info w AGENCY: [facility]  Permission granted to share info w Relationship: Spouse  Permission granted to share info w Contact Information: [phone]  Emotional Assessment       Orientation: : Oriented to Self, Oriented to Place, Oriented to Situation Alcohol / Substance Use: Not Applicable Psych Involvement: No  (comment)  Admission diagnosis:  Acute ischemic stroke (HCC) [I63.9] Acute CVA (cerebrovascular accident) [facility]) [I63.9] Patient Active Problem List   Diagnosis Date Noted   Pressure injury of skin of sacral region 05/08/2024   Dysgeusia 05/08/2024   Acute CVA (cerebrovascular accident) (HCC) 9/28 status post TNK 05/07/2024   Intracranial microhemorrhage hemorrhage (HCC) 05/07/2024   Acute DVT (deep venous thrombosis) (HCC) 05/07/2024   History of common carotid artery stent placement 05/07/2024   History of CAD (coronary artery disease) 05/07/2024   History of DVT (deep vein thrombosis) 05/07/2024   Dysphagia 05/07/2024   Acute left hemiparesis (HCC) 05/07/2024   CAD (coronary artery disease) 01/11/2024   Smoking addiction 01/11/2024   Aortic valve stenosis 10/14/2023   Heart murmur 09/03/2023   Chronic hypoxic respiratory failure (HCC) 08/11/2023   Continuous opioid dependence (HCC) 08/10/2023   Microalbuminuria due to type 2 diabetes mellitus (HCC) 07/25/2022   Abscess 02/06/2022   Candidal intertrigo 02/06/2022   At risk for heart failure 01/29/2022   Statin not tolerated 12/31/2021   Osteoarthritis of right knee 12/24/2021   Hardening of the aorta (main artery of the heart) 12/03/2021   Other specified personal risk factors, not elsewhere classified 12/03/2021   Ulcer of midfoot due to diabetes mellitus (HCC) 12/03/2021   Aortic root dilatation 08/27/2021   Chronic obstructive lung disease (HCC) 08/27/2021   Chronic ulcer of right foot (HCC) 08/27/2021   History  of pulmonary embolism 08/27/2021   Joint pain 08/27/2021   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 (HCC) 08/27/2021   Cellulitis of right leg 08/05/2021   Venous insufficiency of right leg 08/05/2021   Morbid obesity with BMI of 40.0-44.9, adult (HCC) 07/06/2019   Congestive heart failure (HCC) 07/06/2019   Obstructive lung disease (HCC) 07/06/2019   Acute  pulmonary embolism (HCC) 07/06/2019   Tobacco abuse 07/06/2019   Chronic pain of both hips 12/29/2018   Chronic pain of both shoulders 12/29/2018   Onychomycosis due to dermatophyte 09/28/2018   Abduction deformity of foot, right 05/10/2018   Contracture of both Achilles tendons 02/01/2018   Other acquired hammer toe 02/01/2018   Pre-ulcerative calluses 02/01/2018   Chronic diarrhea 05/26/2017   Obstructive sleep apnea (adult) (pediatric) 05/26/2017   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 [place] [address] Phone: [phone] Fax: [phone]     Social Drivers of Health (SDOH) Social History: SDOH Screenings   Food Insecurity: No Food Insecurity (05/08/2024)  Housing: Low Risk  (05/08/2024)  Transportation Needs: No Transportation Needs (05/08/2024)  Utilities: Not At Risk (05/08/2024)  Social Connections: Moderately Isolated (05/08/2024)  Tobacco Use: High Risk (05/07/2024)   SDOH Interventions:     Readmission Risk Interventions     No data to display

## 2024-05-10 ENCOUNTER — Observation Stay (HOSPITAL_COMMUNITY)

## 2024-05-10 DIAGNOSIS — I609 Nontraumatic subarachnoid hemorrhage, unspecified: Secondary | ICD-10-CM | POA: Diagnosis not present

## 2024-05-10 DIAGNOSIS — I63511 Cerebral infarction due to unspecified occlusion or stenosis of right middle cerebral artery: Secondary | ICD-10-CM | POA: Diagnosis not present

## 2024-05-10 DIAGNOSIS — R29708 NIHSS score 8: Secondary | ICD-10-CM | POA: Diagnosis not present

## 2024-05-10 DIAGNOSIS — R1310 Dysphagia, unspecified: Secondary | ICD-10-CM | POA: Diagnosis not present

## 2024-05-10 DIAGNOSIS — I7090 Unspecified atherosclerosis: Secondary | ICD-10-CM | POA: Diagnosis not present

## 2024-05-10 DIAGNOSIS — I82439 Acute embolism and thrombosis of unspecified popliteal vein: Secondary | ICD-10-CM

## 2024-05-10 DIAGNOSIS — I639 Cerebral infarction, unspecified: Secondary | ICD-10-CM | POA: Diagnosis not present

## 2024-05-10 DIAGNOSIS — I629 Nontraumatic intracranial hemorrhage, unspecified: Secondary | ICD-10-CM | POA: Diagnosis not present

## 2024-05-10 LAB — GLUCOSE, CAPILLARY: Glucose-Capillary: 148 mg/dL — ABNORMAL HIGH (ref 70–99)

## 2024-05-10 MED ORDER — FLUOXETINE HCL 20 MG PO CAPS: 20.0000 mg | ORAL_CAPSULE | Freq: Every day | ORAL | Status: DC

## 2024-05-10 MED ORDER — FUROSEMIDE 20 MG PO TABS: 40.0000 mg | ORAL_TABLET | Freq: Every day | ORAL | Status: DC

## 2024-05-10 MED ORDER — HYDROCODONE-ACETAMINOPHEN 7.5-325 MG PO TABS: 1.0000 | ORAL_TABLET | Freq: Three times a day (TID) | ORAL | Status: AC | PRN

## 2024-05-10 MED ORDER — UMECLIDINIUM BROMIDE 62.5 MCG/ACT IN AEPB: 1.0000 | INHALATION_SPRAY | Freq: Every day | RESPIRATORY_TRACT | Status: DC

## 2024-05-10 MED ORDER — ATORVASTATIN CALCIUM 40 MG PO TABS: 40.0000 mg | ORAL_TABLET | Freq: Every day | ORAL | Status: AC

## 2024-05-10 MED ORDER — LOSARTAN POTASSIUM 100 MG PO TABS: 50.0000 mg | ORAL_TABLET | Freq: Every day | ORAL | Status: DC

## 2024-05-10 MED ORDER — NOVOLOG FLEXPEN 100 UNIT/ML ~~LOC~~ SOPN: PEN_INJECTOR | SUBCUTANEOUS | Status: DC

## 2024-05-10 MED ORDER — PANTOPRAZOLE SODIUM 40 MG PO TBEC: 40.0000 mg | DELAYED_RELEASE_TABLET | Freq: Every day | ORAL | Status: DC

## 2024-05-10 MED ORDER — INSULIN GLARGINE 100 UNIT/ML ~~LOC~~ SOLN: 10.0000 [IU] | Freq: Every day | SUBCUTANEOUS | Status: DC

## 2024-05-10 MED ORDER — DULOXETINE HCL 30 MG PO CPEP: 30.0000 mg | ORAL_CAPSULE | Freq: Every day | ORAL | Status: AC

## 2024-05-10 MED ORDER — AMANTADINE HCL 100 MG PO CAPS: 200.0000 mg | ORAL_CAPSULE | Freq: Two times a day (BID) | ORAL | Status: DC

## 2024-05-10 MED ORDER — POLYETHYLENE GLYCOL 3350 17 G PO PACK: 17.0000 g | PACK | Freq: Every day | ORAL | Status: DC

## 2024-05-10 NOTE — Progress Notes (Signed)
 Reviewed AVS with family including all stroke educations.  Copy of AVS in packet for transport and copy given to family

## 2024-05-10 NOTE — Plan of Care (Signed)
  Problem: Education: Goal: Ability to describe self-care measures that may prevent or decrease complications (Diabetes Survival Skills Education) will improve Outcome: Progressing Goal: Individualized Educational Video(s) Outcome: Progressing   Problem: Coping: Goal: Ability to adjust to condition or change in health will improve Outcome: Progressing   Problem: Fluid Volume: Goal: Ability to maintain a balanced intake and output will improve Outcome: Progressing   Problem: Nutritional: Goal: Maintenance of adequate nutrition will improve Outcome: Progressing Goal: Progress toward achieving an optimal weight will improve Outcome: Progressing

## 2024-05-10 NOTE — Progress Notes (Addendum)
 STROKE TEAM PROGRESS NOTE    SIGNIFICANT HOSPITAL EVENTS   10/11: Arrived to [facility] via private ambulance from [place] after having stroke imaging and workup detailed below, imaging also noted left popliteal DVT along with a small volume PE in the right lower lobe on the CT of the chest. IVC filter placed 10/8. Patient  developed having sudden onset left-sided weakness and left vision loss and was taken to outside hospital where he received thrombolytics. Post TNK, noted to have developed a R posterior MCA stroke with petechial hemorrhage and overlying smal SAH.  CT head tonight with right Posterior MCA distribution infarct with 4 mm midline shift that appears to be stable compared to [facility] from outside facility that was obtained on 05/05/2024.  CT head also demonstrates petechial hemorrhage with small overlying subarachnoid hemorrhage.  Wife did bring images on disk, but these are not viewable. Pending getting these uploaded.   INTERIM HISTORY/SUBJECTIVE No acute event overnight. Pt neuro stable unchanged. Repeat CT showed unchanged scattered small hematoma due to hemorrhagic conversion along with petechial hemorrhage. Continue to hold antithrombotics.   OBJECTIVE  CBC    Component Value Date/Time   WBC 9.7 05/08/2024 0512   RBC 4.80 05/08/2024 0512   HGB 14.8 05/08/2024 0512   HCT 43.4 05/08/2024 0512   PLT 271 05/08/2024 0512   MCV 90.4 05/08/2024 0512   MCH 30.8 05/08/2024 0512   MCHC 34.1 05/08/2024 0512   RDW 13.2 05/08/2024 0512   LYMPHSABS 1.9 05/07/2024 2001   MONOABS 0.8 05/07/2024 2001   EOSABS 0.2 05/07/2024 2001   BASOSABS 0.1 05/07/2024 2001    BMET    Component Value Date/Time   NA 137 05/08/2024 0512   K 3.6 05/08/2024 0512   CL 100 05/08/2024 0512   CO2 26 05/08/2024 0512   GLUCOSE 155 (H) 05/08/2024 0512   BUN 21 05/08/2024 0512   CREATININE 0.85 05/08/2024 0512   CALCIUM 8.1 (L) 05/08/2024 0512   GFRNONAA >60 05/08/2024 0512    IMAGING past 24 hours CT HEAD WO  CONTRAST ( ) Result Date: 05/10/2024 EXAM: CT HEAD WITHOUT CONTRAST 05/10/2024 02:57:37 AM TECHNIQUE: CT of the head was performed without the administration of intravenous contrast. Automated exposure control, iterative reconstruction, and/or weight based adjustment of the mA/kV was utilized to reduce the radiation dose to as low as reasonably achievable. COMPARISON: CT on 10/13. CLINICAL HISTORY: Stroke, follow up; Follow on midline shift changes/potential hemorrhage-seen on CT on 10/13. FINDINGS: BRAIN AND VENTRICLES: Continued interval evolution of right MCA distribution infarct, overall similar in size and distribution. Associated regional mass effect with 5 mm right to left shift relatively similar. Superimposed scattered foci of associated hemorrhage again seen similar to prior. No malignant hemorrhagic transformation or hematoma. No new acute intracranial infarct. No new acute intracranial hemorrhage. No hydrocephalus or ventricular trapping. No extra-axial collection. ORBITS: No acute abnormality. SINUSES: Scattered mucosal thickening noted about the ethmoid air cells and maxillary sinuses. Superimposed right maxillary sinus retention cyst. SOFT TISSUES AND SKULL: No acute soft tissue abnormality. No skull fracture. IMPRESSION: 1. Continued interval evolution of right MCA distribution infarct, overall similar in size and distribution. Associated regional mass effect and 5 mm right-to-left shift, similar. 2. Superimposed scattered foci of associated hemorrhage, similar to prior. No malignant hemorrhagic transformation. 3. No other new acute intracranial edema. Electronically signed by: Morene Hoard MD 05/10/2024 03:12 AM EDT RP Workstation: [id number]    Vitals:   05/09/24 2027 05/10/24 0051 05/10/24 0400 05/10/24 0817  BP: ROLLEN)  146/60 (!) 108/59 122/60 (!) 155/65  Pulse:   66 74  Resp: 15 15 20 10   Temp: 98.5 F (36.9 C) 98.4 F (36.9 C) 98.2 F (36.8 C) (!) 97.5 F (36.4 C)  TempSrc:  Oral Oral Oral Oral  SpO2:   94%   Weight:      Height:         PHYSICAL EXAM General:  Alert, well-nourished, well-developed patient in no acute distress Psych:  Mood and affect appropriate for situation CV: Regular rate and rhythm on monitor Respiratory:  Regular, unlabored respirations on room air GI: Abdomen soft and nontender   NEURO:  Mental Status: Drowsy, awakens to voice.  Awake alert oriented x 3. Speech/Language: speech is without dysarthria or aphasia.  Naming, repetition, fluency, and comprehension intact--paucity of speech.  Follows simple commands.  Cranial Nerves:  II: PERRL. Visual fields full.  III, IV, VI: Right gaze preference but does cross midline unable to have complete and held left gaze V: Sensation is intact to light touch and symmetrical to face.  VII: Left facial droop VIII: hearing intact to voice. IX, X: Palate elevates symmetrically. Phonation is normal.  KP:Dynloizm shrug 5/5. XII: tongue is midline without fasciculations. Motor:  LUE: 3-/5, 2/5 grip. Immediate drift.  LLE: 3/5, 4+/5 plantar foot flexion. Immediate drift.  Tone: is normal and bulk is normal Sensation-decreased on left Coordination: FTN intact on right, unable to perform on left. Gait- deferred  Most Recent NIH 8     ASSESSMENT/PLAN  [name] is a 73 y.o. male with history of HTN, DM2, HLD, OSA, morbid obesity, COPD, CAD, DVTs, PEs who developed sudden onset left-sided weakness, left vision loss while vacationing in [place] .  Was given TNK 9/28.  Post TNK developed right posterior MCA stroke with petechial hemorrhage and overlying small SAH.  Workup there also revealed left popliteal DVT and small volume PE.  IVC filter was placed 10/8.  Patient was Transported by privately hired ambulance to [facility], arrived 10/11.NIH on Admission10  Stroke:  right MCA infarct s/p TNK with subsequent petechial hemorrhage and small scattered hematomas, etiology:  large vessel  disease, extensive atherosclerosis  9/28 presented to OSH for R MCA infarct-->treated with TNK Post-TNK imaging showed petechial hemorrhage and subarachnoid hemorrhage Now back to [facility] for continued management  CT head 10/11 In comparison to outside CT head from 05/05/2024, no substantial change in large right MCA territory infarct with petechial hemorrhage and overlying small volume of subarachnoid hemorrhage. Similar 4 mm leftward midline shift. CTA head & neck: branch stenosis at right MCA bifurcation, widespread atherosclerosis in head and neck.  Patent left carotid vascular stent. Notable stenosis: Left vertebral artery origin, both ICA siphons, right vertebral artery origin MRI Evolving right MCA territory infarct with extensive petechial hemorrhage. The extent of the infarct and mass effect is similar. Overlying subarachnoid hemorrhage appears increased comparing with last CT CT repeat Progressed leftward midline shift now 6 mm vs 4 mm at presentation. Right MCA territory infarct with increased confluence and density of extensive petechial hemorrhage since presentation. CT repeat unchanged scattered small hematoma due to hemorrhagic conversion along with petechial hemorrhage 2D Echo EF 55-60% LDL 25 HgbA1c 6.7 VTE prophylaxis - lovenox aspirin 325 mg daily prior to admission, now on no antithrombotic given extensive petechial hemorrhage and small scattered hematomas. Continue to hold antithrombotics at this time Therapy recommendations:  CIR Disposition:  CIR  Recent left popliteal DVT, recent small volume PE right lower lobe Likely  DVT post stroke due to immobility  IVC filter placed at OSH Off antithrombotics due to extensive petechial hemorrhage and small scattered hematomas  Hypertension Home meds:  losartan 100mg  daily, hydrochlorothiazide daily, lasix  20mg  daily Stable BP goal < 160 Avoid hypotension due to severe atherosclerosis  Long term BP goal  normotensive  Hyperlipidemia Home meds:  zetia  10 LDL 25, goal < 70 Now on lipitor 40 Continue at discharge  Diabetes type II Controlled Home meds:  metformin, insulin HgbA1c 6.7, goal < 7.0 CBGs SSI Recommend close follow-up with PCP for better DM control  Tobacco Abuse Patient smokes 2 packs per day        Ready to quit? Yes Nicotine replacement therapy provided  Dysphagia Patient has post-stroke dysphagia, SLP consulted On dys 3 diet and thin liquuid Advance diet as tolerated  Other Stroke Risk Factors Obesity, Body mass index is 43.54 kg/m., BMI >/= 30 associated with increased stroke risk, recommend weight loss, diet and exercise as appropriate  Coronary artery disease Obstructive sleep apnea, on CPAP at home  Other Active Problems   Hospital day # 1  Neurology will sign off. Please call with questions. Pt will follow up with stroke clinic Dr. Rosemarie at [facility] in about 4 weeks. Thanks for the consult.   Ary Cummins, MD PhD Stroke Neurology 05/10/2024 2:16 PM   To contact Stroke Continuity provider, please refer to [email or web address]. After hours, contact General Neurology

## 2024-05-10 NOTE — Progress Notes (Signed)
 Report given to Leeroy, RN at [employer]. Pt's SO at the bedside, packed all of his belongings.  SO had given CD's from prior hospital with imaging on admission.  Unit secretary spoke with radiology 10/13 and they did not have the CD's, was told they may be being uploaded at the offsite facility.  Pt's SO would like a phone call when CD's are found and available to be returned to her.  IV and tele removed.  Pt and SO in agreement with POC.

## 2024-05-10 NOTE — TOC Transition Note (Addendum)
 Transition of Care [facility]) - Discharge Note   Patient Details  Name: REMIGIO MCMILLON MRN: [id number] Date of Birth: 1951/01/29  Transition of Care [facility]) CM/SW Contact:  Marval Gell, RN Phone Number: 05/10/2024, 9:22 AM   Clinical Narrative:     Janese Foots at [place] [facility] that patient will DC to[facility], she confirms availability, bed will assigned when he arrives.  Spoke w patient and wife about DC to [facility] [facility] today, both agreeable, wife asked for PTAR around 72 so she could get here with clothes for him.  PTAR called for 11am.  Nurse to call report to [phone] Final next level of care: IP Rehab Facility Barriers to Discharge: No Barriers Identified   Patient Goals and CMS Choice            Discharge Placement                       Discharge Plan and Services Additional resources added to the After Visit Summary for     Discharge Planning Services: CM Consult Post Acute Care Choice: IP Rehab                               Social Drivers of Health (SDOH) Interventions SDOH Screenings   Food Insecurity: No Food Insecurity (05/08/2024)  Housing: Low Risk  (05/08/2024)  Transportation Needs: No Transportation Needs (05/08/2024)  Utilities: Not At Risk (05/08/2024)  Social Connections: Moderately Isolated (05/08/2024)  Tobacco Use: High Risk (05/07/2024)     Readmission Risk Interventions     No data to display

## 2024-05-10 NOTE — Discharge Summary (Signed)
 PATIENT DETAILS Name: Darren Hunt Age: 73 y.o. Sex: male Date of Birth: Feb 18, 1951 MRN: [id number]. Admitting Physician: Subrina Sundil, MD ERE:Rympdrn, Salomon, FNP  Admit Date: 05/07/2024 Discharge date: 05/10/2024  Recommendations for Outpatient Follow-up:  Follow up with PCP in 1-2 weeks Please obtain CMP/CBC in one week Please ensure follow up with Neurology/Stroke Clinic in 2 weeks Hold Pradaxa/antiplatelets until seen by neurology  Admitted From:  OSH  Disposition: Rehabilitation facility   Discharge Condition: fair  CODE STATUS:   Code Status: Full Code   Diet recommendation:  Diet Order             Diet - low sodium heart healthy           Diet Carb Modified           DIET DYS 3 Room service appropriate? Yes; Fluid consistency: Thin  Diet effective now                    Brief Summary: Patient is a 73 y.o.  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  Hospital course complicated by left popliteal DVT and small volume PE.  Family signed him out of hospital in [place] -hired private ambulance and brought him to [facility] for a second opinion..   Significant events: 10/11>> admit to [facility].   Significant studies: 10/11>> CT head: Large right MCA territory infarct with petechial hemorrhage.  Small volume SAH. 10/11>> A1c: 6.7 10/11>> LDL: 25 10/12>> CT angio head/neck: No LVO, patent left carotid vascular stent 10/12>> MRI brain: Evolving right MCA territory infarct with extensive petechial hemorrhage-overlying SAH-worse than prior imaging.  10/13>> echo: EF 55-60% 10/13>> CT head: Progress leftward midline shift 6 mm (4 mm initial), right MCA territory infarct with increased  confluent/density of extensive petechial hemorrhage. 10/14>> CT head: Associated regional mass effect-5 mm-similar to prior, superimposed scattered foci of associated hemorrhage-similar to prior.  No new intracranial edema.   Significant microbiology data: None   Procedures: None   Consults: Neurology   Brief Hospital Course: Acute ischemic CVA with cytotoxic edema/petechial hemorrhage Suspected etiology-atherosclerotic large vessel disease Continues to have left-sided hemiparesis-remains essentially unchanged overnight Workup as above. On 10/13-had CT evidence of slightly more midline shift-more intense petechial hemorrhage/intraparenchymal hemorrhage, Pradaxa was held-patient was monitored closely-repeat CT on 10/14-shows stable changes.  Reviewed CT head with neurology-Dr. Xu-continue to hold Pradaxa/antiplatelets until evaluated at stroke clinic in 2 weeks.  Okay for discharge-ambulatory referral to neurology/stroke clinic placed. Seems to be tolerating a dysphagia 3 diet well.   Left popliteal DVT/PE right lower lobe Likely provoked. Started on Pradaxa on 10/12-due to worsening intensity of petechial hemorrhage-this was held on 10/13-plan is to reassess in 2 weeks at neurology office before resuming anticoagulation. Per chart review-he has had prior VTE-probably will require lifelong anticoagulation once stable from neurological point of view to restart. S/p IVC filter placement at outside facility on 10/8 (per prior notes-this is MRI compatible/retrievable IVC filter)   History of CAD s/p left carotid stenting 2014 Stent appears patent on CTA On statin Unable to use antiplatelets-given potential hemorrhage associated with CVA and SAH.   History of CAD-s/p CTO 2013 Currently stable-no anginal symptoms Continue statin No antiplatelets-see above.   History of moderate aortic stenosis  Has systolic murmur-awaiting updated echo.   HTN BP stable Continue furosemide /losartan    HLD Statin   DM-2 CBG stable Lantus 10 units daily +SSI  COPD on 3 L of oxygen nocturnally Chronic hypoxic respiratory failure Stable-not in exacerbation Continue bronchodilators.   OSA-CPAP at home   Mood disorder Appears relatively stable Continue Prozac/Cymbalta/amantadine.   History of iliac/abdominal aortic aneurysm Chronic issue-continue outpatient surveillance with PCP/vascular surgery.   Pressure Ulcer: Agree with assessment and plan as outlined below. Wound 05/08/24 1009 Pressure Injury Coccyx Stage 2 -  Partial thickness loss of dermis presenting as a shallow open injury with a red, pink wound bed without slough. (Active)     Wound 05/08/24 1048 Pressure Injury Buttocks Left Deep Tissue Pressure Injury - Purple or maroon localized area of discolored intact skin or blood-filled blister due to damage of underlying soft tissue from pressure and/or shear. (Active)     Discharge Diagnoses:  Principal Problem:   Acute CVA (cerebrovascular accident) (HCC) 9/28 status post TNK Active Problems:   Intracranial microhemorrhage hemorrhage (HCC)   Dysphagia   Acute pulmonary embolism (HCC)   Acute DVT (deep venous thrombosis) (HCC)   Acute left hemiparesis (HCC)   Dysgeusia   Insulin dependent type 2 diabetes mellitus (HCC)   Essential hypertension   Hyperlipidemia   Morbid obesity with BMI of 40.0-44.9, adult (HCC)   Chronic hypoxic respiratory failure (HCC)   History of CAD (coronary artery disease)   Pressure injury of skin of sacral region   Discharge Instructions:  Activity:  As tolerated with Full fall precautions use walker/cane & assistance as needed   Discharge Instructions     Ambulatory referral to Neurology   Complete by: As directed    An appointment is requested in approximately: 2 weeks   Diet - low sodium heart healthy   Complete by: As directed    Diet Carb Modified   Complete by: As directed    Discharge instructions   Complete by: As  directed    Follow with Primary MD  Chrisco, Danny, FNP in 1-2 weeks  Follow-up with stroke clinic in 2 weeks  Please get a complete blood count and chemistry panel checked by your Primary MD at your next visit, and again as instructed by your Primary MD.  Get Medicines reviewed and adjusted: Please take all your medications with you for your next visit with your Primary MD  Laboratory/radiological data: Please request your Primary MD to go over all hospital tests and procedure/radiological results at the follow up, please ask your Primary MD to get all Hospital records sent to his/her office.  In some cases, they will be blood work, cultures and biopsy results pending at the time of your discharge. Please request that your primary care M.D. follows up on these results.  Also Note the following: If you experience worsening of your admission symptoms, develop shortness of breath, life threatening emergency, suicidal or homicidal thoughts you must seek medical attention immediately by calling 911 or calling your MD immediately  if symptoms less severe.  You must read complete instructions/literature along with all the possible adverse reactions/side effects for all the Medicines you take and that have been prescribed to you. Take any new Medicines after you have completely understood and accpet all the possible adverse reactions/side effects.   Do not drive when taking Pain medications or sleeping medications (Benzodaizepines)  Do not take more than prescribed Pain, Sleep and Anxiety Medications. It is not advisable to combine anxiety,sleep  and pain medications without talking with your primary care practitioner  Special Instructions: If you have smoked or chewed Tobacco  in the last 2 yrs please stop smoking, stop any regular Alcohol  and or any Recreational drug use.  Wear Seat belts while driving.  Please note: You were cared for by a hospitalist during your hospital stay. Once you are  discharged, your primary care physician will handle any further medical issues. Please note that NO REFILLS for any discharge medications will be authorized once you are discharged, as it is imperative that you return to your primary care physician (or establish a relationship with a primary care physician if you do not have one) for your post hospital discharge needs so that they can reassess your need for medications and monitor your lab values.   Discharge wound care:   Complete by: As directed    Cleanse sacral and L buttock wound with NS, apply Xeroform gauze Soila [phone]) to wound beds daily and secure with silicone foam.   Increase activity slowly   Complete by: As directed       Allergies as of 05/10/2024       Reactions   Heparin    Agitation        Medication List     STOP taking these medications    aspirin 325 MG tablet   Gabapentin (Once-Daily) 300 MG Tabs   gabapentin 300 MG capsule Commonly known as: NEURONTIN   hydrochlorothiazide 25 MG tablet Commonly known as: HYDRODIURIL   minocycline 100 MG capsule Commonly known as: MINOCIN   OneTouch Ultra Test test strip Generic drug: glucose blood   Ozempic (2 MG/DOSE) 8 MG/3ML Sopn Generic drug: Semaglutide (2 MG/DOSE)   Toujeo Max SoloStar 300 UNIT/ML Solostar Pen Generic drug: insulin glargine (2 Unit Dial) Replaced by: insulin glargine 100 UNIT/ML injection       TAKE these medications    albuterol  108 (90 Base) MCG/ACT inhaler Commonly known as: VENTOLIN  HFA Inhale 2 puffs into the lungs every 6 (six) hours as needed for wheezing or shortness of breath.   amantadine 100 MG capsule Commonly known as: SYMMETREL Take 2 capsules (200 mg total) by mouth 2 (two) times daily.   atorvastatin 40 MG tablet Commonly known as: LIPITOR Take 1 tablet (40 mg total) by mouth daily.   DULoxetine 30 MG capsule Commonly known as: CYMBALTA Take 1 capsule (30 mg total) by mouth daily. What changed:   additional instructions Another medication with the same name was removed. Continue taking this medication, and follow the directions you see here.   ezetimibe  10 MG tablet Commonly known as: ZETIA  Take 1 tablet (10 mg total) by mouth daily.   famotidine  20 MG tablet Commonly known as: PEPCID  Take 20 mg by mouth daily.   FLUoxetine 20 MG capsule Commonly known as: PROZAC Take 1 capsule (20 mg total) by mouth daily.   furosemide  20 MG tablet Commonly known as: LASIX  Take 2 tablets (40 mg total) by mouth daily. What changed:  how much to take when to take this reasons to take this   HYDROcodone-acetaminophen 7.5-325 MG tablet Commonly known as: NORCO Take 1 tablet by mouth 3 (three) times daily as needed for moderate pain (pain score 4-6).   insulin glargine 100 UNIT/ML injection Commonly known as: LANTUS Inject 0.1 mLs (10 Units total) into the skin daily at 10 pm. Replaces: Toujeo Max SoloStar 300 UNIT/ML Solostar Pen   losartan 100 MG tablet Commonly known as: COZAAR  Take 0.5 tablets (50 mg total) by mouth daily. What changed: how much to take   melatonin 3 MG Tabs tablet Take 6 mg by mouth at bedtime.   metFORMIN 500 MG tablet Commonly known as: GLUCOPHAGE TAKE 1 TABLET BY MOUTH  TWICE DAILY WITH FOOD   NovoLOG 100 UNIT/ML injection Generic drug: insulin aspart Inject 20 Units into the skin 3 (three) times daily. INJECT 20 UNIT SUBCUTANEOUSLY TWICE A DAY 20 UNITS TWO TIMES A DAY AND 30 UNITS ONCE AT NIGHT What changed: Another medication with the same name was added. Make sure you understand how and when to take each.   NovoLOG FlexPen 100 UNIT/ML FlexPen Generic drug: insulin aspart 0-9 Units, Subcutaneous, 3 times daily with meals CBG < 70: Implement Hypoglycemia measures CBG 70 - 120: 0 units CBG 121 - 150: 1 unit CBG 151 - 200: 2 units CBG 201 - 250: 3 units CBG 251 - 300: 5 units CBG 301 - 350: 7 units CBG 351 - 400: 9 units CBG > 400: call MD What  changed: You were already taking a medication with the same name, and this prescription was added. Make sure you understand how and when to take each.   pantoprazole 40 MG tablet Commonly known as: PROTONIX Take 1 tablet (40 mg total) by mouth daily.   polyethylene glycol 17 g packet Commonly known as: MIRALAX / GLYCOLAX Take 17 g by mouth daily.   umeclidinium bromide 62.5 MCG/ACT Aepb Commonly known as: INCRUSE ELLIPTA Inhale 1 puff into the lungs daily.               Discharge Care Instructions  (From admission, onward)           Start     Ordered   05/10/24 0000  Discharge wound care:       Comments: Cleanse sacral and L buttock wound with NS, apply Xeroform gauze Soila [phone]) to wound beds daily and secure with silicone foam.   05/10/24 0848            Follow-up Information      [facility]. Schedule an appointment as soon as possible for a visit in 2 week(s).   Specialty: Neurology Why: Office will call with date/time, If you dont hear from them,please give them a call Contact information: [address] [place]  [address] [phone]        Chrisco, Salomon, FNP. Schedule an appointment as soon as possible for a visit in 1 week(s).   Specialty: Family Medicine Contact information: [address] [place] [address] [phone]                Allergies  Allergen Reactions   Heparin     Agitation     Other Procedures/Studies: CT HEAD WO CONTRAST ( ) Result Date: 05/10/2024 EXAM: CT HEAD WITHOUT CONTRAST 05/10/2024 02:57:37 AM TECHNIQUE: CT of the head was performed without the administration of intravenous contrast. Automated exposure control, iterative reconstruction, and/or weight based adjustment of the mA/kV was utilized to reduce the radiation dose to as low as reasonably achievable. COMPARISON: CT on 10/13. CLINICAL HISTORY: Stroke, follow up; Follow on midline shift  changes/potential hemorrhage-seen on CT on 10/13. FINDINGS: BRAIN AND VENTRICLES: Continued interval evolution of right MCA distribution infarct, overall similar in size and distribution. Associated regional mass effect with 5 mm right to left shift relatively similar. Superimposed scattered foci of associated hemorrhage again seen similar to prior. No malignant  hemorrhagic transformation or hematoma. No new acute intracranial infarct. No new acute intracranial hemorrhage. No hydrocephalus or ventricular trapping. No extra-axial collection. ORBITS: No acute abnormality. SINUSES: Scattered mucosal thickening noted about the ethmoid air cells and maxillary sinuses. Superimposed right maxillary sinus retention cyst. SOFT TISSUES AND SKULL: No acute soft tissue abnormality. No skull fracture. IMPRESSION: 1. Continued interval evolution of right MCA distribution infarct, overall similar in size and distribution. Associated regional mass effect and 5 mm right-to-left shift, similar. 2. Superimposed scattered foci of associated hemorrhage, similar to prior. No malignant hemorrhagic transformation. 3. No other new acute intracranial edema. Electronically signed by: Morene Hoard MD 05/10/2024 03:12 AM EDT RP Workstation: [id number]   ECHOCARDIOGRAM LIMITED Result Date: 05/09/2024    ECHOCARDIOGRAM LIMITED REPORT   Patient Name:   Darren Hunt Date of Exam: 05/09/2024 Medical Rec #:  [id number]     Height:       73.0 in Accession #:    [id number]    Weight:       330.0 lb Date of Birth:  1951/05/16    BSA:          2.662 m Patient Age:    73 years      BP:           131/51 mmHg Patient Gender: M             HR:           79 bpm. Exam Location:  Inpatient Procedure: Limited Echo, Limited Color Doppler, Cardiac Doppler and RV Strain            Analysis (Both Spectral and Color Flow Doppler were utilized during            procedure). Indications:    Stroke I63.9, Pulmonary Embolus  History:        Patient has prior  history of Echocardiogram examinations, most                 recent 02/18/2024. CAD, Pulmonary Embolus and Stroke, Aortic                 Valve Disease; Risk Factors:Hypertension, Dyslipidemia, Diabetes                 and Sleep Apnea.  Sonographer:    Koleen Popper RDCS Referring Phys: SUBRINA SUNDIL IMPRESSIONS  1. LIMITED ECHO.  2. Left ventricular ejection fraction, by estimation, is 55 to 60%. The left ventricle has normal function. The left ventricular internal cavity size was mildly dilated.  3. Right ventricular systolic function is normal. The right ventricular size is normal. Tricuspid regurgitation signal is inadequate for assessing PA pressure.  4. The mitral valve is degenerative. No evidence of mitral valve regurgitation. No evidence of mitral stenosis.  5. The aortic valve is tricuspid. Aortic valve regurgitation is mild. Moderate aortic valve stenosis.  6. The inferior vena cava is normal in size with greater than 50% respiratory variability, suggesting right atrial pressure of 3 mmHg. Comparison(s): A prior study was performed on 02/18/2024. LVEF 60-65%, Grade I diastolic dysfunction, mild LAE, moderate MAC, Moderate AS, estimated RAP 3 mmHG. Conclusion(s)/Recommendation(s): No intracardiac source of embolism detected on this transthoracic study. Consider a transesophageal echocardiogram to exclude cardiac source of embolism if clinically indicated. FINDINGS  Left Ventricle: Left ventricular ejection fraction, by estimation, is 55 to 60%. The left ventricle has normal function. The left ventricular internal cavity size was mildly dilated. There is borderline left ventricular hypertrophy. Right Ventricle:  The right ventricular size is normal. No increase in right ventricular wall thickness. Right ventricular systolic function is normal. Tricuspid regurgitation signal is inadequate for assessing PA pressure. Left Atrium: Left atrial size was normal in size. Pericardium: There is no evidence of  pericardial effusion. Presence of epicardial fat layer. Mitral Valve: The mitral valve is degenerative in appearance. Mild to moderate mitral annular calcification. No evidence of mitral valve stenosis. Aortic Valve: The aortic valve is tricuspid. Aortic valve regurgitation is mild. Moderate aortic stenosis is present. Aortic valve mean gradient measures 32.0 mmHg. Aortic valve peak gradient measures 49.6 mmHg. Aortic valve area, by VTI measures 2.34 cm. Aorta: The aortic root is normal in size and structure. Venous: The inferior vena cava is normal in size with greater than 50% respiratory variability, suggesting right atrial pressure of 3 mmHg. IAS/Shunts: The atrial septum is grossly normal. Additional Comments: Spectral Doppler performed. Color Doppler performed.  LEFT VENTRICLE PLAX 2D LVIDd:         6.20 cm   Diastology LVIDs:         4.10 cm   LV e' medial:    5.22 cm/s LV PW:         1.20 cm   LV E/e' medial:  13.6 LV IVS:        1.30 cm   LV e' lateral:   8.81 cm/s LVOT diam:     2.30 cm   LV E/e' lateral: 8.1 LV SV:         165 LV SV Index:   62 LVOT Area:     4.15 cm  RIGHT VENTRICLE             IVC RV Basal diam:  4.20 cm     IVC diam: 1.90 cm RV Mid diam:    3.80 cm RV S prime:     28.10 cm/s TAPSE (M-mode): 2.4 cm LEFT ATRIUM           Index LA diam:      4.30 cm 1.62 cm/m LA Vol (A4C): 70.2 ml 26.37 ml/m  AORTIC VALVE AV Area (Vmax):    2.01 cm AV Area (Vmean):   1.98 cm AV Area (VTI):     2.34 cm AV Vmax:           352.00 cm/s AV Vmean:          269.000 cm/s AV VTI:            0.707 m AV Peak Grad:      49.6 mmHg AV Mean Grad:      32.0 mmHg LVOT Vmax:         170.00 cm/s LVOT Vmean:        128.000 cm/s LVOT VTI:          0.398 m LVOT/AV VTI ratio: 0.56  AORTA Ao Root diam: 3.40 cm MITRAL VALVE MV Area (PHT): 3.48 cm     SHUNTS MV Decel Time: 218 msec     Systemic VTI:  0.40 m MV E velocity: 71.00 cm/s   Systemic Diam: 2.30 cm MV A velocity: 106.00 cm/s MV E/A ratio:  0.67 Sunit Tolia  Electronically signed by Madonna Large Signature Date/Time: 05/09/2024/1:34:31 PM    Final    CT HEAD WO CONTRAST ( ) Result Date: 05/09/2024 EXAM: CT HEAD WITHOUT CONTRAST 05/09/2024 10:13:18 AM TECHNIQUE: CT of the head was performed without the administration of intravenous contrast. Automated exposure control, iterative reconstruction, and/or weight based adjustment of the  mA/kV was utilized to reduce the radiation dose to as low as reasonably achievable. COMPARISON: Brain MRI 05/08/2024, Head CT 05/08/2024, and earlier head CTs. CLINICAL HISTORY: 73 year old male. Stroke, follow up. Possible increased subarachnoid blood on brain MRI 05/08/2024. FINDINGS: BRAIN AND VENTRICLES: Right MCA infarct with extensive petechial hemorrhage. Ongoing fairly extensive heterogeneous edema in the right hemisphere. Mass effect on the right lateral ventricle has not significantly changed, but leftward midline shift up to 6 mm is progressed since 05/07/2024 (4 mm at that time). Basilar cisterns remain patent. Increased confluence and density of multifocal petechial hemorrhage in the infarcted right MCA territory (series 3, image 65) but overall no malignant type of hemorrhagic transformation at this time. No discrete subarachnoid blood by CT. No intraventricular blood. No ventriculomegaly. Other vascular territory gray white differentiation remains maintained. Calcified atherosclerosis. No extra-axial collection. ORBITS: No acute abnormality. SINUSES: Stable mild paranasal sinus mucosal thickening. SOFT TISSUES AND SKULL: No acute soft tissue abnormality. No skull fracture. IMPRESSION: 1. Progressed leftward midline shift now 6 mm vs 4 mm at presentation. 2. Right MCA territory infarct with increased confluence and density of extensive petechial hemorrhage since presentation. But no malignant hemorrhagic transformation. 3. No discrete subarachnoid hemorrhage by CT. Electronically signed by: Helayne Hurst MD 05/09/2024 10:38 AM  EDT RP Workstation: [id number]   MR BRAIN WO CONTRAST Result Date: 05/08/2024 EXAM: MR Brain without Intravenous Contrast. CLINICAL HISTORY: Stroke, follow up. TECHNIQUE: Magnetic resonance images of the brain without intravenous contrast in multiple planes. CONTRAST: Without. COMPARISON: None provided. FINDINGS: BRAIN: Evolving right MCA territory infarct with extensive petechial hemorrhage and overlying subarachnoid hemorrhage. The extent of the infarct is similar. The amount of overlying subarachnoid hemorrhage appears increased since CT head on May 07, 2024. Similar mass effect. The central arterial and venous flow voids are patent. VENTRICLES: No hydrocephalus. ORBITS: The orbits are normal. SINUSES AND MASTOIDS: Right maxillary sinus retention cyst.  Mastoid air cells are clear. BONES: No acute fracture or focal osseous lesion. IMPRESSION: 1. Evolving right MCA territory infarct with extensive petechial hemorrhage. The extent of the infarct and mass effect is similar. 2. Overlying subarachnoid hemorrhage appears increased since CT head on May 07, 2024. A repeat CT head could provide more direct comparison if clinically warranted. Electronically signed by: Gilmore Molt MD 05/08/2024 07:40 PM EDT RP Workstation: [id number]   CT ANGIO HEAD NECK W WO CM Result Date: 05/08/2024 EXAM: CTA Head and Neck with Intravenous Contrast. CT Head without Contrast. CLINICAL HISTORY: 73 year old male with stroke/TIA, determine embolic source. Stroke on 09/28. Transfer from outside facility. TECHNIQUE: Axial CTA images of the head and neck performed with intravenous contrast. MIP reconstructed images were created and reviewed. Axial computed tomography images of the head/brain performed without intravenous contrast. Note: Per PQRS, the description of internal carotid artery percent stenosis, including 0 percent or normal exam, is based on [place] American Symptomatic Carotid Endarterectomy Trial (NASCET) criteria. Dose  reduction technique was used including one or more of the following: automated exposure control, adjustment of mA and kV according to patient size, and/or iterative reconstruction. CONTRAST: Without and with IV contrast. 75 mL iohexol (OMNIPAQUE) 350 MG/ML injection. COMPARISON: Head CT 05/07/2024. FINDINGS: CT HEAD: BRAIN: Confluent hypodense edema in the right hemisphere, right MCA territory with scattered subcentimeter petechial hyperdensity suspicious for petechial hemorrhage (such as series 6 image 20). Mass effect on the right lateral ventricle with only trace leftward midline shift. There are areas of cortical involvement, although some of the edema pattern  is reminiscent of vasogenic edema. Stable gray and white matter differentiation since yesterday. Basilar cisterns remain patent. No new or progressive intracranial hemorrhage. No gaze deviation. No extra-axial collection. VENTRICLES: Mass effect on the right lateral ventricle with only trace leftward midline shift. No hydrocephalus. ORBITS: The orbits are unremarkable. SINUSES AND MASTOIDS: The paranasal sinuses and mastoid air cells are clear. CTA NECK: COMMON CAROTID ARTERIES: Widespread atherosclerosis of the right CCA and proximal right ICA but no hemodynamically significant stenosis in the neck. Left CCA atherosclerosis without stenosis. No dissection or occlusion. INTERNAL CAROTID ARTERIES: Widespread atherosclerosis of the right CCA and proximal right ICA but no hemodynamically significant stenosis in the neck. Right ICA siphon is patent with moderate calcified atherosclerosis and moderate cavernous segment stenosis. Patent vascular stent of the distal left CCA continuing through the left ICA to the C1-C2 vertebral level. No high grade incident stenosis suspected by CTA. Left ICA siphon is patent with moderate calcified atherosclerosis and moderate stenosis of the left anterior genu. No dissection or occlusion. VERTEBRAL ARTERIES: Calcified  atherosclerosis of the proximal right subclavian artery also affects the right vertebral artery origin with moderate stenosis. Right vertebral artery is then patent to the vertebrobasilar junction without additional stenosis. Proximal left subclavian atherosclerosis also affects the left vertebral artery origin which is moderately to severely stenotic on series 13 image 194. Codominant left vertebral artery then is patent with no additional stenosis to the vertebrobasilar junction. No dissection or occlusion. CTA HEAD: ANTERIOR CEREBRAL ARTERIES: Dominant right A1 segment. Normal anterior communicating artery. Dominant right ACA throughout. ACA branches are within normal limits. No significant stenosis. No occlusion. No aneurysm. MIDDLE CEREBRAL ARTERIES: Left MCA bifurcates early without stenosis. Left MCA branches are within normal limits. Right MCA M1 segment and bifurcation are patent. No M1 stenosis. But proximal MCA M2 branch irregularity and stenosis is up to moderate on series 17 image 15. No discrete branch occlusion is identified. Right MCA branches are generally attenuated compared to the left side (series 15 image 18). No aneurysm. POSTERIOR CEREBRAL ARTERIES: Patent PCA origins. Bilateral PCA branches are patent without stenosis. No significant stenosis. No occlusion. No aneurysm. BASILAR ARTERY: Patent basilar artery without stenosis. No significant stenosis. No occlusion. No aneurysm. OTHER: Superior sagittal sinus is enhancing and patent. Both PICA origins are patent. Patent SCA origins. Posterior communicating arteries are diminutive or absent. Aortic arch atherosclerosis. 3 vessel arch configuration. Proximal great vessel atherosclerosis. SOFT TISSUES: Negative visible upper chest. Negative visible nonvascular neck soft tissue spaces. No masses or lymphadenopathy. BONES: Widespread advanced disc and endplate degeneration in the visible spine. No acute osseous abnormality. IMPRESSION: 1. Negative  for large vessel occlusion but positive for branches stenoses at the Right bifurcation and generally attenuated RMCA branches compared to the left. 2. Widespread atherosclerosis in the head and neck. Patent Left Carotid vascular stent. Notable stenoses: Left vertebral artery origin (Moderate to Severe), Both ICA siphons (Moderate). Right vertebral artery origin (Moderate). 3. Right MCA territory confluent edema with scattered petechial hemorrhage and mild mass effect stable from Head CT yesterday. Trace leftward midline shift. No malignant hemorrhagic transformation or new intracranial abnormality. Electronically signed by: Helayne Hurst MD 05/08/2024 09:34 AM EDT RP Workstation: [id number]   CT HEAD WO CONTRAST ( ) Result Date: 05/07/2024 EXAM: CT HEAD WITHOUT CONTRAST 05/07/2024 09:32:31 PM TECHNIQUE: CT of the head was performed without the administration of intravenous contrast. Automated exposure control, iterative reconstruction, and/or weight based adjustment of the mA/kV was utilized to reduce the radiation dose to  as low as reasonably achievable. COMPARISON: Outside CT head May 05, 2024. CLINICAL HISTORY: History of acute CVA status post TNK afterward developed microhemorrhage on 9/28. FINDINGS: BRAIN AND VENTRICLES: In comparison to outside CT head from 05/05/2024 no substantial change in large right MCA territory infarct with petechial hemorrhage and overlying small volume of subarachnoid hemorrhage. Similar 4 mm leftward midline shift. No hydrocephalus. No extra-axial collection. No mass effect or midline shift. ORBITS: No acute abnormality. SINUSES: Right maxillary sinus retention cyst. SOFT TISSUES AND SKULL: No skull fracture. IMPRESSION: 1. In comparison to outside CT head from 05/05/2024, no substantial change in large right MCA territory infarct with petechial hemorrhage and overlying small volume of subarachnoid hemorrhage. 2. Similar 4 mm leftward midline shift. Electronically signed by:  Gilmore Molt MD 05/07/2024 09:54 PM EDT RP Workstation: [id number]   CT OUTSIDE FILMS HEAD/FACE Result Date: 05/07/2024 This examination belongs to an outside facility and is stored here for comparison purposes only.  Contact the originating outside institution for any associated report or interpretation.  DG Chest 1 View Result Date: 05/07/2024 CLINICAL DATA:  Hypoxia EXAM: CHEST  1 VIEW COMPARISON:  05/12/2005 FINDINGS: Heart and mediastinal contours are within normal limits. No focal opacities or effusions. No acute bony abnormality. Aortic atherosclerosis. IMPRESSION: No active disease. Electronically Signed   By: Franky Crease M.D.   On: 05/07/2024 20:09     TODAY-DAY OF DISCHARGE:  Subjective:   Eastin Swing today has no headache,no chest abdominal pain,no new weakness tingling or numbness, feels much better wants to go home today.  Objective:   Blood pressure (!) 155/65, pulse 74, temperature (!) 97.5 F (36.4 C), temperature source Oral, resp. rate 10, height 6' 1 (1.854 m), weight (!) 149.7 kg, SpO2 94%.  Intake/Output Summary (Last 24 hours) at 05/10/2024 0848 Last data filed at 05/10/2024 0554 Gross per 24 hour  Intake 420 ml  Output 1850 ml  Net -1430 ml   Filed Weights   05/07/24 1904  Weight: (!) 149.7 kg    Exam: Awake Alert, Oriented *3, No new F.N deficits, Normal affect [place].AT,PERRAL Supple Neck,No JVD, No cervical lymphadenopathy appriciated.  Symmetrical Chest wall movement, Good air movement bilaterally, CTAB RRR,No Gallops,Rubs or new Murmurs, No Parasternal Heave +ve B.Sounds, Abd Soft, Non tender, No organomegaly appriciated, No rebound -guarding or rigidity. No Cyanosis, Clubbing or edema, No new Rash or bruise   PERTINENT RADIOLOGIC STUDIES: CT HEAD WO CONTRAST ( ) Result Date: 05/10/2024 EXAM: CT HEAD WITHOUT CONTRAST 05/10/2024 02:57:37 AM TECHNIQUE: CT of the head was performed without the administration of intravenous contrast.  Automated exposure control, iterative reconstruction, and/or weight based adjustment of the mA/kV was utilized to reduce the radiation dose to as low as reasonably achievable. COMPARISON: CT on 10/13. CLINICAL HISTORY: Stroke, follow up; Follow on midline shift changes/potential hemorrhage-seen on CT on 10/13. FINDINGS: BRAIN AND VENTRICLES: Continued interval evolution of right MCA distribution infarct, overall similar in size and distribution. Associated regional mass effect with 5 mm right to left shift relatively similar. Superimposed scattered foci of associated hemorrhage again seen similar to prior. No malignant hemorrhagic transformation or hematoma. No new acute intracranial infarct. No new acute intracranial hemorrhage. No hydrocephalus or ventricular trapping. No extra-axial collection. ORBITS: No acute abnormality. SINUSES: Scattered mucosal thickening noted about the ethmoid air cells and maxillary sinuses. Superimposed right maxillary sinus retention cyst. SOFT TISSUES AND SKULL: No acute soft tissue abnormality. No skull fracture. IMPRESSION: 1. Continued interval evolution of right MCA distribution infarct, overall  similar in size and distribution. Associated regional mass effect and 5 mm right-to-left shift, similar. 2. Superimposed scattered foci of associated hemorrhage, similar to prior. No malignant hemorrhagic transformation. 3. No other new acute intracranial edema. Electronically signed by: Morene Hoard MD 05/10/2024 03:12 AM EDT RP Workstation: [id number]   ECHOCARDIOGRAM LIMITED Result Date: 05/09/2024    ECHOCARDIOGRAM LIMITED REPORT   Patient Name:   Darren Hunt Date of Exam: 05/09/2024 Medical Rec #:  [id number]     Height:       73.0 in Accession #:    [id number]    Weight:       330.0 lb Date of Birth:  10-26-1950    BSA:          2.662 m Patient Age:    73 years      BP:           131/51 mmHg Patient Gender: M             HR:           79 bpm. Exam Location:  Inpatient  Procedure: Limited Echo, Limited Color Doppler, Cardiac Doppler and RV Strain            Analysis (Both Spectral and Color Flow Doppler were utilized during            procedure). Indications:    Stroke I63.9, Pulmonary Embolus  History:        Patient has prior history of Echocardiogram examinations, most                 recent 02/18/2024. CAD, Pulmonary Embolus and Stroke, Aortic                 Valve Disease; Risk Factors:Hypertension, Dyslipidemia, Diabetes                 and Sleep Apnea.  Sonographer:    Koleen Popper RDCS Referring Phys: SUBRINA SUNDIL IMPRESSIONS  1. LIMITED ECHO.  2. Left ventricular ejection fraction, by estimation, is 55 to 60%. The left ventricle has normal function. The left ventricular internal cavity size was mildly dilated.  3. Right ventricular systolic function is normal. The right ventricular size is normal. Tricuspid regurgitation signal is inadequate for assessing PA pressure.  4. The mitral valve is degenerative. No evidence of mitral valve regurgitation. No evidence of mitral stenosis.  5. The aortic valve is tricuspid. Aortic valve regurgitation is mild. Moderate aortic valve stenosis.  6. The inferior vena cava is normal in size with greater than 50% respiratory variability, suggesting right atrial pressure of 3 mmHg. Comparison(s): A prior study was performed on 02/18/2024. LVEF 60-65%, Grade I diastolic dysfunction, mild LAE, moderate MAC, Moderate AS, estimated RAP 3 mmHG. Conclusion(s)/Recommendation(s): No intracardiac source of embolism detected on this transthoracic study. Consider a transesophageal echocardiogram to exclude cardiac source of embolism if clinically indicated. FINDINGS  Left Ventricle: Left ventricular ejection fraction, by estimation, is 55 to 60%. The left ventricle has normal function. The left ventricular internal cavity size was mildly dilated. There is borderline left ventricular hypertrophy. Right Ventricle: The right ventricular size is normal.  No increase in right ventricular wall thickness. Right ventricular systolic function is normal. Tricuspid regurgitation signal is inadequate for assessing PA pressure. Left Atrium: Left atrial size was normal in size. Pericardium: There is no evidence of pericardial effusion. Presence of epicardial fat layer. Mitral Valve: The mitral valve is degenerative in appearance. Mild to moderate mitral annular calcification. No evidence  of mitral valve stenosis. Aortic Valve: The aortic valve is tricuspid. Aortic valve regurgitation is mild. Moderate aortic stenosis is present. Aortic valve mean gradient measures 32.0 mmHg. Aortic valve peak gradient measures 49.6 mmHg. Aortic valve area, by VTI measures 2.34 cm. Aorta: The aortic root is normal in size and structure. Venous: The inferior vena cava is normal in size with greater than 50% respiratory variability, suggesting right atrial pressure of 3 mmHg. IAS/Shunts: The atrial septum is grossly normal. Additional Comments: Spectral Doppler performed. Color Doppler performed.  LEFT VENTRICLE PLAX 2D LVIDd:         6.20 cm   Diastology LVIDs:         4.10 cm   LV e' medial:    5.22 cm/s LV PW:         1.20 cm   LV E/e' medial:  13.6 LV IVS:        1.30 cm   LV e' lateral:   8.81 cm/s LVOT diam:     2.30 cm   LV E/e' lateral: 8.1 LV SV:         165 LV SV Index:   62 LVOT Area:     4.15 cm  RIGHT VENTRICLE             IVC RV Basal diam:  4.20 cm     IVC diam: 1.90 cm RV Mid diam:    3.80 cm RV S prime:     28.10 cm/s TAPSE (M-mode): 2.4 cm LEFT ATRIUM           Index LA diam:      4.30 cm 1.62 cm/m LA Vol (A4C): 70.2 ml 26.37 ml/m  AORTIC VALVE AV Area (Vmax):    2.01 cm AV Area (Vmean):   1.98 cm AV Area (VTI):     2.34 cm AV Vmax:           352.00 cm/s AV Vmean:          269.000 cm/s AV VTI:            0.707 m AV Peak Grad:      49.6 mmHg AV Mean Grad:      32.0 mmHg LVOT Vmax:         170.00 cm/s LVOT Vmean:        128.000 cm/s LVOT VTI:          0.398 m LVOT/AV VTI  ratio: 0.56  AORTA Ao Root diam: 3.40 cm MITRAL VALVE MV Area (PHT): 3.48 cm     SHUNTS MV Decel Time: 218 msec     Systemic VTI:  0.40 m MV E velocity: 71.00 cm/s   Systemic Diam: 2.30 cm MV A velocity: 106.00 cm/s MV E/A ratio:  0.67 Sunit Tolia Electronically signed by Madonna Large Signature Date/Time: 05/09/2024/1:34:31 PM    Final    CT HEAD WO CONTRAST ( ) Result Date: 05/09/2024 EXAM: CT HEAD WITHOUT CONTRAST 05/09/2024 10:13:18 AM TECHNIQUE: CT of the head was performed without the administration of intravenous contrast. Automated exposure control, iterative reconstruction, and/or weight based adjustment of the mA/kV was utilized to reduce the radiation dose to as low as reasonably achievable. COMPARISON: Brain MRI 05/08/2024, Head CT 05/08/2024, and earlier head CTs. CLINICAL HISTORY: 73 year old male. Stroke, follow up. Possible increased subarachnoid blood on brain MRI 05/08/2024. FINDINGS: BRAIN AND VENTRICLES: Right MCA infarct with extensive petechial hemorrhage. Ongoing fairly extensive heterogeneous edema in the right hemisphere. Mass effect on the right lateral ventricle has  not significantly changed, but leftward midline shift up to 6 mm is progressed since 05/07/2024 (4 mm at that time). Basilar cisterns remain patent. Increased confluence and density of multifocal petechial hemorrhage in the infarcted right MCA territory (series 3, image 65) but overall no malignant type of hemorrhagic transformation at this time. No discrete subarachnoid blood by CT. No intraventricular blood. No ventriculomegaly. Other vascular territory gray white differentiation remains maintained. Calcified atherosclerosis. No extra-axial collection. ORBITS: No acute abnormality. SINUSES: Stable mild paranasal sinus mucosal thickening. SOFT TISSUES AND SKULL: No acute soft tissue abnormality. No skull fracture. IMPRESSION: 1. Progressed leftward midline shift now 6 mm vs 4 mm at presentation. 2. Right MCA territory  infarct with increased confluence and density of extensive petechial hemorrhage since presentation. But no malignant hemorrhagic transformation. 3. No discrete subarachnoid hemorrhage by CT. Electronically signed by: Helayne Hurst MD 05/09/2024 10:38 AM EDT RP Workstation: [id number]   MR BRAIN WO CONTRAST Result Date: 05/08/2024 EXAM: MR Brain without Intravenous Contrast. CLINICAL HISTORY: Stroke, follow up. TECHNIQUE: Magnetic resonance images of the brain without intravenous contrast in multiple planes. CONTRAST: Without. COMPARISON: None provided. FINDINGS: BRAIN: Evolving right MCA territory infarct with extensive petechial hemorrhage and overlying subarachnoid hemorrhage. The extent of the infarct is similar. The amount of overlying subarachnoid hemorrhage appears increased since CT head on May 07, 2024. Similar mass effect. The central arterial and venous flow voids are patent. VENTRICLES: No hydrocephalus. ORBITS: The orbits are normal. SINUSES AND MASTOIDS: Right maxillary sinus retention cyst.  Mastoid air cells are clear. BONES: No acute fracture or focal osseous lesion. IMPRESSION: 1. Evolving right MCA territory infarct with extensive petechial hemorrhage. The extent of the infarct and mass effect is similar. 2. Overlying subarachnoid hemorrhage appears increased since CT head on May 07, 2024. A repeat CT head could provide more direct comparison if clinically warranted. Electronically signed by: Gilmore Molt MD 05/08/2024 07:40 PM EDT RP Workstation: [id number]   CT ANGIO HEAD NECK W WO CM Result Date: 05/08/2024 EXAM: CTA Head and Neck with Intravenous Contrast. CT Head without Contrast. CLINICAL HISTORY: 73 year old male with stroke/TIA, determine embolic source. Stroke on 09/28. Transfer from outside facility. TECHNIQUE: Axial CTA images of the head and neck performed with intravenous contrast. MIP reconstructed images were created and reviewed. Axial computed tomography images of the  head/brain performed without intravenous contrast. Note: Per PQRS, the description of internal carotid artery percent stenosis, including 0 percent or normal exam, is based on [place] [employer] Symptomatic Carotid Endarterectomy Trial (NASCET) criteria. Dose reduction technique was used including one or more of the following: automated exposure control, adjustment of mA and kV according to patient size, and/or iterative reconstruction. CONTRAST: Without and with IV contrast. 75 mL iohexol (OMNIPAQUE) 350 MG/ML injection. COMPARISON: Head CT 05/07/2024. FINDINGS: CT HEAD: BRAIN: Confluent hypodense edema in the right hemisphere, right MCA territory with scattered subcentimeter petechial hyperdensity suspicious for petechial hemorrhage (such as series 6 image 20). Mass effect on the right lateral ventricle with only trace leftward midline shift. There are areas of cortical involvement, although some of the edema pattern is reminiscent of vasogenic edema. Stable gray and white matter differentiation since yesterday. Basilar cisterns remain patent. No new or progressive intracranial hemorrhage. No gaze deviation. No extra-axial collection. VENTRICLES: Mass effect on the right lateral ventricle with only trace leftward midline shift. No hydrocephalus. ORBITS: The orbits are unremarkable. SINUSES AND MASTOIDS: The paranasal sinuses and mastoid air cells are clear. CTA NECK: COMMON CAROTID ARTERIES: Widespread  atherosclerosis of the right CCA and proximal right ICA but no hemodynamically significant stenosis in the neck. Left CCA atherosclerosis without stenosis. No dissection or occlusion. INTERNAL CAROTID ARTERIES: Widespread atherosclerosis of the right CCA and proximal right ICA but no hemodynamically significant stenosis in the neck. Right ICA siphon is patent with moderate calcified atherosclerosis and moderate cavernous segment stenosis. Patent vascular stent of the distal left CCA continuing through the left ICA to  the C1-C2 vertebral level. No high grade incident stenosis suspected by CTA. Left ICA siphon is patent with moderate calcified atherosclerosis and moderate stenosis of the left anterior genu. No dissection or occlusion. VERTEBRAL ARTERIES: Calcified atherosclerosis of the proximal right subclavian artery also affects the right vertebral artery origin with moderate stenosis. Right vertebral artery is then patent to the vertebrobasilar junction without additional stenosis. Proximal left subclavian atherosclerosis also affects the left vertebral artery origin which is moderately to severely stenotic on series 13 image 194. Codominant left vertebral artery then is patent with no additional stenosis to the vertebrobasilar junction. No dissection or occlusion. CTA HEAD: ANTERIOR CEREBRAL ARTERIES: Dominant right A1 segment. Normal anterior communicating artery. Dominant right ACA throughout. ACA branches are within normal limits. No significant stenosis. No occlusion. No aneurysm. MIDDLE CEREBRAL ARTERIES: Left MCA bifurcates early without stenosis. Left MCA branches are within normal limits. Right MCA M1 segment and bifurcation are patent. No M1 stenosis. But proximal MCA M2 branch irregularity and stenosis is up to moderate on series 17 image 15. No discrete branch occlusion is identified. Right MCA branches are generally attenuated compared to the left side (series 15 image 18). No aneurysm. POSTERIOR CEREBRAL ARTERIES: Patent PCA origins. Bilateral PCA branches are patent without stenosis. No significant stenosis. No occlusion. No aneurysm. BASILAR ARTERY: Patent basilar artery without stenosis. No significant stenosis. No occlusion. No aneurysm. OTHER: Superior sagittal sinus is enhancing and patent. Both PICA origins are patent. Patent SCA origins. Posterior communicating arteries are diminutive or absent. Aortic arch atherosclerosis. 3 vessel arch configuration. Proximal great vessel atherosclerosis. SOFT TISSUES:  Negative visible upper chest. Negative visible nonvascular neck soft tissue spaces. No masses or lymphadenopathy. BONES: Widespread advanced disc and endplate degeneration in the visible spine. No acute osseous abnormality. IMPRESSION: 1. Negative for large vessel occlusion but positive for branches stenoses at the Right bifurcation and generally attenuated RMCA branches compared to the left. 2. Widespread atherosclerosis in the head and neck. Patent Left Carotid vascular stent. Notable stenoses: Left vertebral artery origin (Moderate to Severe), Both ICA siphons (Moderate). Right vertebral artery origin (Moderate). 3. Right MCA territory confluent edema with scattered petechial hemorrhage and mild mass effect stable from Head CT yesterday. Trace leftward midline shift. No malignant hemorrhagic transformation or new intracranial abnormality. Electronically signed by: Helayne Hurst MD 05/08/2024 09:34 AM EDT RP Workstation: [id number]     PERTINENT LAB RESULTS: CBC: Recent Labs    05/07/24 2001 05/08/24 0512  WBC 10.1 9.7  HGB 16.1 14.8  HCT 47.4 43.4  PLT 284 271   CMET CMP     Component Value Date/Time   NA 137 05/08/2024 0512   K 3.6 05/08/2024 0512   CL 100 05/08/2024 0512   CO2 26 05/08/2024 0512   GLUCOSE 155 (H) 05/08/2024 0512   BUN 21 05/08/2024 0512   CREATININE 0.85 05/08/2024 0512   CALCIUM 8.1 (L) 05/08/2024 0512   PROT 5.8 (L) 05/08/2024 0512   ALBUMIN 2.6 (L) 05/08/2024 0512   AST 32 05/08/2024 0512   ALT 31 05/08/2024  0512   ALKPHOS 48 05/08/2024 0512   BILITOT 1.7 (H) 05/08/2024 0512   GFRNONAA >60 05/08/2024 0512    GFR Estimated Creatinine Clearance: 119.8 mL/min (by C-G formula based on SCr of 0.85 mg/dL). No results for input(s): LIPASE, AMYLASE in the last 72 hours. No results for input(s): CKTOTAL, CKMB, CKMBINDEX, TROPONINI in the last 72 hours. Invalid input(s): POCBNP No results for input(s): DDIMER in the last 72 hours. Recent Labs     05/07/24 2001  HGBA1C 6.7*   Recent Labs    05/08/24 0512  CHOL 62  HDL 22*  LDLCALC 25  TRIG 74  CHOLHDL 2.8   No results for input(s): TSH, T4TOTAL, T3FREE, THYROIDAB in the last 72 hours.  Invalid input(s): FREET3 No results for input(s): VITAMINB12, FOLATE, FERRITIN, TIBC, IRON, RETICCTPCT in the last 72 hours. Coags: No results for input(s): INR in the last 72 hours.  Invalid input(s): PT Microbiology: No results found for this or any previous visit (from the past 240 hours).  FURTHER DISCHARGE INSTRUCTIONS:  Get Medicines reviewed and adjusted: Please take all your medications with you for your next visit with your Primary MD  Laboratory/radiological data: Please request your Primary MD to go over all hospital tests and procedure/radiological results at the follow up, please ask your Primary MD to get all Hospital records sent to his/her office.  In some cases, they will be blood work, cultures and biopsy results pending at the time of your discharge. Please request that your primary care M.D. goes through all the records of your hospital data and follows up on these results.  Also Note the following: If you experience worsening of your admission symptoms, develop shortness of breath, life threatening emergency, suicidal or homicidal thoughts you must seek medical attention immediately by calling 911 or calling your MD immediately  if symptoms less severe.  You must read complete instructions/literature along with all the possible adverse reactions/side effects for all the Medicines you take and that have been prescribed to you. Take any new Medicines after you have completely understood and accpet all the possible adverse reactions/side effects.   Do not drive when taking Pain medications or sleeping medications (Benzodaizepines)  Do not take more than prescribed Pain, Sleep and Anxiety Medications. It is not advisable to combine anxiety,sleep  and pain medications without talking with your primary care practitioner  Special Instructions: If you have smoked or chewed Tobacco  in the last 2 yrs please stop smoking, stop any regular Alcohol  and or any Recreational drug use.  Wear Seat belts while driving.  Please note: You were cared for by a hospitalist during your hospital stay. Once you are discharged, your primary care physician will handle any further medical issues. Please note that NO REFILLS for any discharge medications will be authorized once you are discharged, as it is imperative that you return to your primary care physician (or establish a relationship with a primary care physician if you do not have one) for your post hospital discharge needs so that they can reassess your need for medications and monitor your lab values.  Total Time spent coordinating discharge including counseling, education and face to face time equals greater than 30 minutes.  SignedBETHA Donalda Applebaum 05/10/2024 8:48 AM

## 2024-05-10 NOTE — Progress Notes (Signed)
 Pt discharged via [facility] without incident

## 2024-05-12 ENCOUNTER — Telehealth: Payer: Self-pay | Admitting: Surgery

## 2024-05-19 NOTE — Telephone Encounter (Signed)
 Pt appts scheduled.

## 2024-05-30 ENCOUNTER — Encounter: Payer: Self-pay | Admitting: Radiology

## 2024-06-08 ENCOUNTER — Telehealth: Payer: Self-pay | Admitting: Neurology

## 2024-06-08 ENCOUNTER — Ambulatory Visit: Admitting: Neurology

## 2024-06-08 ENCOUNTER — Encounter: Payer: Self-pay | Admitting: Neurology

## 2024-06-08 VITALS — BP 138/88 | HR 103 | Resp 15

## 2024-06-08 DIAGNOSIS — I63411 Cerebral infarction due to embolism of right middle cerebral artery: Secondary | ICD-10-CM

## 2024-06-08 DIAGNOSIS — G8114 Spastic hemiplegia affecting left nondominant side: Secondary | ICD-10-CM

## 2024-06-08 DIAGNOSIS — I2699 Other pulmonary embolism without acute cor pulmonale: Secondary | ICD-10-CM | POA: Diagnosis not present

## 2024-06-08 DIAGNOSIS — I82431 Acute embolism and thrombosis of right popliteal vein: Secondary | ICD-10-CM | POA: Diagnosis not present

## 2024-06-08 NOTE — Patient Instructions (Signed)
 I had a long d/w patient, his wife and daughter  about his recent hemorrhagic stroke, DVT and pulmonary embolism risk for recurrent stroke/TIAs, personally independently reviewed imaging studies and stroke evaluation results and answered questions..recommend check stat CT head today and if hemorrhagic changes have resolved start Eliquis for his DVT and pulmonary embolism and for secondary stroke prevention and maintain strict control of hypertension with blood pressure goal below 130/90, diabetes with hemoglobin A1c goal below 6.5% and lipids with LDL cholesterol goal below 70 mg/dL. I also advised the patient to eat a healthy diet with plenty of whole grains, cereals, fruits and vegetables, exercise regularly and maintain ideal body weight .continue ongoing physical occupational and speech therapy.  Check 30-day heart monitor for paroxysmal A-fib.  Recommend follow-up with his rehab physician from [facility] for management of poststroke spasticity.  Followup in the future with me in future only as needed as patient lives far away.  Stroke Prevention Some medical conditions and behaviors can lead to a higher chance of having a stroke. You can help prevent a stroke by eating healthy, exercising, not smoking, and managing any medical conditions you have. Stroke is a leading cause of functional impairment. Primary prevention is particularly important because a majority of strokes are first-time events. Stroke changes the lives of not only those who experience a stroke but also their family and other caregivers. How can this condition affect me? A stroke is a medical emergency and should be treated right away. A stroke can lead to brain damage and can sometimes be life-threatening. If a person gets medical treatment right away, there is a better chance of surviving and recovering from a stroke. What can increase my risk? The following medical conditions may increase your risk of a stroke: Cardiovascular  disease. High blood pressure (hypertension). Diabetes. High cholesterol. Sickle cell disease. Blood clotting disorders (hypercoagulable state). Obesity. Sleep disorders (obstructive sleep apnea). Other risk factors include: Being older than age 73. Having a history of blood clots, stroke, or mini-stroke (transient ischemic attack, TIA). Genetic factors, such as race, ethnicity, or a family history of stroke. Smoking cigarettes or using other tobacco products. Taking birth control pills, especially if you also use tobacco. Heavy use of alcohol or drugs, especially cocaine and methamphetamine. Physical inactivity. What actions can I take to prevent this? Manage your health conditions High cholesterol levels. Eating a healthy diet is important for preventing high cholesterol. If cholesterol cannot be managed through diet alone, you may need to take medicines. Take any prescribed medicines to control your cholesterol as told by your health care provider. Hypertension. To reduce your risk of stroke, try to keep your blood pressure below 130/80. Eating a healthy diet and exercising regularly are important for controlling blood pressure. If these steps are not enough to manage your blood pressure, you may need to take medicines. Take any prescribed medicines to control hypertension as told by your health care provider. Ask your health care provider if you should monitor your blood pressure at home. Have your blood pressure checked every year, even if your blood pressure is normal. Blood pressure increases with age and some medical conditions. Diabetes. Eating a healthy diet and exercising regularly are important parts of managing your blood sugar (glucose). If your blood sugar cannot be managed through diet and exercise, you may need to take medicines. Take any prescribed medicines to control your diabetes as told by your health care provider. Get evaluated for obstructive sleep apnea. Talk to  your health  care provider about getting a sleep evaluation if you snore a lot or have excessive sleepiness. Make sure that any other medical conditions you have, such as atrial fibrillation or atherosclerosis, are managed. Nutrition Follow instructions from your health care provider about what to eat or drink to help manage your health condition. These instructions may include: Reducing your daily calorie intake. Limiting how much salt (sodium) you use to 1,500 milligrams (mg) each day. Using only healthy fats for cooking, such as olive oil, canola oil, or sunflower oil. Eating healthy foods. You can do this by: Choosing foods that are high in fiber, such as whole grains, and fresh fruits and vegetables. Eating at least 5 servings of fruits and vegetables a day. Try to fill one-half of your plate with fruits and vegetables at each meal. Choosing lean protein foods, such as lean cuts of meat, poultry without skin, fish, tofu, beans, and nuts. Eating low-fat dairy products. Avoiding foods that are high in sodium. This can help lower blood pressure. Avoiding foods that have saturated fat, trans fat, and cholesterol. This can help prevent high cholesterol. Avoiding processed and prepared foods. Counting your daily carbohydrate intake.  Lifestyle If you drink alcohol: Limit how much you have to: 0-1 drink a day for women who are not pregnant. 0-2 drinks a day for men. Know how much alcohol is in your drink. In the [place] one drink equals one 12 oz bottle of beer ( ), one 5 oz glass of wine ( ), or one 1 oz glass of hard liquor (44mL). Do not use any products that contain nicotine or tobacco. These products include cigarettes, chewing tobacco, and vaping devices, such as e-cigarettes. If you need help quitting, ask your health care provider. Avoid secondhand smoke. Do not use drugs. Activity  Try to stay at a healthy weight. Get at least 30 minutes of exercise on most days, such  as: Fast walking. Biking. Swimming. Medicines Take over-the-counter and prescription medicines only as told by your health care provider. Aspirin or blood thinners (antiplatelets or anticoagulants) may be recommended to reduce your risk of forming blood clots that can lead to stroke. Avoid taking birth control pills. Talk to your health care provider about the risks of taking birth control pills if: You are over 34 years old. You smoke. You get very bad headaches. You have had a blood clot. Where to find more information American Stroke Association: www.strokeassociation.org Get help right away if: You or a loved one has any symptoms of a stroke. BE FAST is an easy way to remember the main warning signs of a stroke: B - Balance. Signs are dizziness, sudden trouble walking, or loss of balance. E - Eyes. Signs are trouble seeing or a sudden change in vision. F - Face. Signs are sudden weakness or numbness of the face, or the face or eyelid drooping on one side. A - Arms. Signs are weakness or numbness in an arm. This happens suddenly and usually on one side of the body. S - Speech. Signs are sudden trouble speaking, slurred speech, or trouble understanding what people say. T - Time. Time to call emergency services. Write down what time symptoms started. You or a loved one has other signs of a stroke, such as: A sudden, severe headache with no known cause. Nausea or vomiting. Seizure. These symptoms may represent a serious problem that is an emergency. Do not wait to see if the symptoms will go away. Get medical help right away. Call your local  emergency services (911 in the [place] Do not drive yourself to the hospital. Summary You can help to prevent a stroke by eating healthy, exercising, not smoking, limiting alcohol intake, and managing any medical conditions you may have. Do not use any products that contain nicotine or tobacco. These include cigarettes, chewing tobacco, and vaping  devices, such as e-cigarettes. If you need help quitting, ask your health care provider. Remember BE FAST for warning signs of a stroke. Get help right away if you or a loved one has any of these signs. This information is not intended to replace advice given to you by your health care provider. Make sure you discuss any questions you have with your health care provider. Document Revised: 06/16/2022 Document Reviewed: 06/16/2022 Elsevier Patient Education  2024 [employer].

## 2024-06-08 NOTE — Progress Notes (Signed)
 [facility] [address] [place]. [place] [address] [phone]       OFFICE CONSULT NOTE  [name] Date of Birth:  Sep 07, 1950 Medical Record Number:  [id number]   Referring MD: Ary Cummins  Reason for Referral: Stroke  HPI: Darren Hunt is a 73 year old Caucasian male seen today for initial office consultation visit for stroke.  He is accompanied by his wife, daughter and granddaughter and history is obtained from them and review of electronic medical records and I personally reviewed pertinent available imaging films in PACS.  He has a past medical history of diabetes, hypertension, coronary artery disease, hyperlipidemia, morbid obesity, sleep apnea, COPD and prior DVTs and pulmonary embolism.  He was vacationing in severe very [place]  where he developed sudden onset of left hemiplegia left-sided vision loss and was taken to the hospital there where he was treated with IV thrombolysis.  Hospital records not available for review today.  Following TNK administration he developed hemorrhagic transformation with petechial hemorrhage and a large right posterior MCA stroke with overlying subarachnoid hemorrhage.  Family wanted patient transferred to be close to home so he was transferred to [facility] where on arrival on 05/08/2024 CT scan showed a large posterior MCA distribution infarct with petechial hemorrhage and overlying small subarachnoid hemorrhage.  There was 4 mm right-to-left midline shift.  Patient was also found to have a left popliteal vein deep vein thrombosis and small volume pulmonary embolism in the right lower lobe of the lung on chest CT done at outside hospital.  He even had an IVC filter placed.  The family left the hospital AMA and had an ambulance and brought him to [facility].  Patient was admitted to the hospital service and neurology was consulted.  NIH stroke scale on admission was 10.  Echocardiogram showed ejection fraction of 55 to 60%.   LDL cholesterol is 25 mg percent.  Hemoglobin A1c of 6.7.  CT angiogram was negative for large vessel occlusion but showed widespread atherosclerosis in the head and the neck.  Patent left carotid stent moderate bilateral right ICA siphon stenosis and moderate right vertebral origin stenosis.  Antithrombotics were held due to subarachnoid and petechial hemorrhage and patient was transferred to inpatient rehab.  Patient finished inpatient rehab and [facility] and is currently at home.  He just darted home physical occupational and speech therapy.  He is able to stand up and walk with one-person assist from the therapist with a walker.  He is able to move his left arm and leg against gravity but has still significant weakness and has a left normal stiffness and spasticity.  He still has significant left-sided peripheral vision loss.  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   Aortic root dilatation 08/27/2021   Aortic valve stenosis 10/14/2023   At risk for heart failure 01/29/2022   Bilateral carotid artery stenosis 08/12/2016   CAD (coronary artery disease) 01/11/2024   Candidal intertrigo 02/06/2022   Cellulitis of right leg 08/05/2021   Chronic diarrhea 05/26/2017   Chronic hypoxemic respiratory failure (HCC) 08/11/2023   Chronic obstructive lung disease (HCC) 08/27/2021   Chronic pain of both hips 12/29/2018   Chronic pain of both knees 07/11/2016   Chronic pain of  both shoulders 12/29/2018   Chronic ulcer of right foot (HCC) 08/27/2021   Congestive heart failure (HCC) 07/06/2019   Continuous opioid dependence (HCC) 08/10/2023   Contracture of both Achilles tendons 02/01/2018   Essential hypertension 02/10/2012   Hardening of the aorta (main  artery of the heart) 12/03/2021   Heart murmur 09/03/2023   History of pulmonary embolism 08/27/2021   Joint pain 08/27/2021   Microalbuminuria due to type 2 diabetes mellitus (HCC) 07/25/2022   Mixed hyperlipidemia 07/11/2016   Morbid obesity (HCC) 07/06/2019   Obstructive lung disease (HCC) 07/06/2019   Obstructive sleep apnea (adult) (pediatric) 05/26/2017   Onychomycosis due to dermatophyte 09/28/2018   Osteoarthritis of right knee 12/24/2021   Other specified personal risk factors, not elsewhere classified 12/03/2021   Pre-ulcerative calluses 02/01/2018   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 On coumadin for 3 months.     Last Assessment & Plan:   Relevant Hx:  Course:  Daily Update:  Today's Plan:     Shortness of breath 02/10/2012   Smoking addiction 01/11/2024   Statin not tolerated 12/31/2021   Tobacco abuse 07/06/2019   Active tobacco use 2 packs/day x 34 years     Type 2 diabetes mellitus (HCC) 02/10/2012   A. With retinopathy and neuropathy  B. Long-term use of insulin     Type 2 diabetes mellitus with diabetic neuropathy, with long-term current use of insulin (HCC) 07/11/2016   Ulcer of midfoot due to diabetes mellitus (HCC) 12/03/2021   Venous insufficiency of right leg 08/05/2021   Venous stasis 08/12/2016    Social History:  Social History   Socioeconomic History   Marital status: Married    Spouse name: Not on file   Number of children: Not on file   Years of education: Not on file   Highest education level: Not on file  Occupational History   Not on file  Tobacco Use   Smoking status: Every Day    Current packs/day: 2.00    Types: Cigarettes   Smokeless tobacco: Never  Vaping Use   Vaping status: Never Used  Substance and Sexual Activity   Alcohol use: Not on file   Drug use: Not on file    Sexual activity: Not on file  Other Topics Concern   Not on file  Social History Narrative   Not on file   Social Drivers of Health   Financial Resource Strain: Not on file  Food Insecurity: No Food Insecurity (05/08/2024)   Hunger Vital Sign    Worried About Running Out of Food in the Last Year: Never true    Ran Out of Food in the Last Year: Never true  Transportation Needs: No Transportation Needs (05/08/2024)   PRAPARE - Administrator, Civil Service (Medical): No    Lack of Transportation (Non-Medical): No  Physical Activity: Not on file  Stress: Not on file  Social Connections: Moderately Isolated (05/08/2024)   Social Connection and Isolation Panel    Frequency of Communication with Friends and Family: Three times a week    Frequency of Social Gatherings with Friends and Family: Twice a week    Attends Religious Services: Never    Database Administrator or Organizations: No    Attends Banker Meetings: Never    Marital Status: Married  Intimate  Partner Violence: Not At Risk (05/08/2024)   Humiliation, Afraid, Rape, and Kick questionnaire    Fear of Current or Ex-Partner: No    Emotionally Abused: No    Physically Abused: No    Sexually Abused: No    Medications:   Current Outpatient Medications on File Prior to Visit  Medication Sig Dispense Refill   atorvastatin (LIPITOR) 40 MG tablet Take 1 tablet (40 mg total) by mouth daily.     DULoxetine (CYMBALTA) 30 MG capsule Take 1 capsule (30 mg total) by mouth daily.     HYDROcodone-acetaminophen (NORCO) 7.5-325 MG tablet Take 1 tablet by mouth 3 (three) times daily as needed for moderate pain (pain score 4-6).     No current facility-administered medications on file prior to visit.    Allergies:   Allergies  Allergen Reactions   Heparin     Agitation    Physical Exam General: Morbidly obese elderly Caucasian male soft ejection systolic murmur, seated, in no evident distress Head: head  normocephalic and atraumatic.   Neck: supple with no carotid or supraclavicular bruits Cardiovascular: regular rate and rhythm,.  Soft ejection systolic murmur. Musculoskeletal: no deformity Skin:  no rash/petichiae Vascular:  Normal pulses all extremities  Neurologic Exam Mental Status: Awake and fully alert. Oriented to place and time. Recent and remote memory intact. Attention span, concentration and fund of knowledge appropriate. Mood and affect appropriate.  Cranial Nerves: Fundoscopic exam reveals sharp disc margins. Pupils equal, briskly reactive to light. Extraocular movements full without nystagmus. Visual fields show dense left homonymous hemianopsia to confrontation. Hearing intact. Facial sensation intact.  Moderate left lower facial weakness., tongue, palate moves normally and symmetrically.  Motor: Spastic left hemiparesis with 3/5 strength of the left upper and lower extremity with increased tone.  Left foot drop.   Sensory.:  Diminished left hemibody and lower face sensation to touch , pinprick , position and vibratory sensation.  Coordination: Normal on the right.  Impaired on the left proportionate to the degree of weakness.. Gait and Station: Deferred as patient did not bring his walker and is one-person assist even with therapist  reflexes: 2+ and asymmetric and brisker on the left. Toes downgoing.   NIHSS  9 Modified Rankin  4   ASSESSMENT: 73 year old Caucasian male with large right MCA infarct in October 2025 likely of cryptogenic etiology.  He developed concurrent DVT and pulmonary embolism but is not on anticoagulation due to hemorrhagic transformation in his infarct and subarachnoid hemorrhage.  Vascular risk factors of morbid obesity, diabetes, hypertension, hyperlipidemia, coronary artery disease, intra and extracranial atherosclerosis and sleep apnea     PLAN:I had a long d/w patient, his wife and daughter  about his recent hemorrhagic stroke, DVT and pulmonary  embolism risk for recurrent stroke/TIAs, personally independently reviewed imaging studies and stroke evaluation results and answered questions..recommend check stat CT head today and if hemorrhagic changes have resolved start Eliquis for his DVT and pulmonary embolism and for secondary stroke prevention and maintain strict control of hypertension with blood pressure goal below 130/90, diabetes with hemoglobin A1c goal below 6.5% and lipids with LDL cholesterol goal below 70 mg/dL. I also advised the patient to eat a healthy diet with plenty of whole grains, cereals, fruits and vegetables, exercise regularly and maintain ideal body weight .continue ongoing physical occupational and speech therapy.  Check 30-day heart monitor for paroxysmal A-fib.  Recommend follow-up with his rehab physician from [facility] for management of poststroke spasticity.  Followup in the future with  me in future only as needed as patient lives far away.    I personally spent a total of 60 minutes in the care of the patient today including getting/reviewing separately obtained history, performing a medically appropriate exam/evaluation, counseling and educating, placing orders, referring and communicating with other health care professionals, documenting clinical information in the EHR, independently interpreting results, and coordinating care.     Eather Popp, MD  Note: This document was prepared with digital dictation and possible smart phrase technology. Any transcriptional errors that result from this process are unintentional.

## 2024-06-08 NOTE — Telephone Encounter (Signed)
 Order sent. The phone number I have for their scheduling is [phone].

## 2024-06-08 NOTE — Telephone Encounter (Signed)
 Appointment time confirmed for wife, she said according to her GPS they will arrive at 10:30 on the dot

## 2024-06-08 NOTE — Telephone Encounter (Signed)
 Patients wife called back and is wanting Stat CT scan order sent to [facility] with [facility]. Stated it is much closer for them

## 2024-06-09 ENCOUNTER — Ambulatory Visit (HOSPITAL_COMMUNITY)

## 2024-06-13 ENCOUNTER — Encounter: Payer: Self-pay | Admitting: Neurology

## 2024-06-13 ENCOUNTER — Ambulatory Visit: Attending: Surgery | Admitting: Surgery

## 2024-06-13 NOTE — Telephone Encounter (Signed)
 Pt wife called stating that Pt has appt today for Stat CAT Scan and needed a form to be sent over for Transpiration the patient wife stated someone was suppose to get back to her about that Pt app today is at 2;00 to day

## 2024-06-13 NOTE — Telephone Encounter (Signed)
 Pt's wife has called to confirm if the form has been received, call routed to Medical Records

## 2024-06-17 ENCOUNTER — Inpatient Hospital Stay (HOSPITAL_COMMUNITY)
Admission: EM | Admit: 2024-06-17 | Discharge: 2024-06-22 | DRG: 871 | Disposition: A | Discharge status: Home / Self Care | Attending: Internal Medicine | Admitting: Internal Medicine

## 2024-06-17 ENCOUNTER — Encounter (HOSPITAL_COMMUNITY): Payer: Self-pay | Admitting: Emergency Medicine

## 2024-06-17 ENCOUNTER — Emergency Department (HOSPITAL_COMMUNITY)

## 2024-06-17 DIAGNOSIS — I5033 Acute on chronic diastolic (congestive) heart failure: Secondary | ICD-10-CM | POA: Diagnosis present

## 2024-06-17 DIAGNOSIS — J1289 Other viral pneumonia: Secondary | ICD-10-CM | POA: Diagnosis present

## 2024-06-17 DIAGNOSIS — R0902 Hypoxemia: Secondary | ICD-10-CM

## 2024-06-17 DIAGNOSIS — E113591 Type 2 diabetes mellitus with proliferative diabetic retinopathy without macular edema, right eye: Secondary | ICD-10-CM | POA: Diagnosis present

## 2024-06-17 DIAGNOSIS — Z66 Do not resuscitate: Secondary | ICD-10-CM | POA: Diagnosis present

## 2024-06-17 DIAGNOSIS — E1140 Type 2 diabetes mellitus with diabetic neuropathy, unspecified: Secondary | ICD-10-CM | POA: Diagnosis present

## 2024-06-17 DIAGNOSIS — I69354 Hemiplegia and hemiparesis following cerebral infarction affecting left non-dominant side: Secondary | ICD-10-CM | POA: Diagnosis not present

## 2024-06-17 DIAGNOSIS — Z888 Allergy status to other drugs, medicaments and biological substances status: Secondary | ICD-10-CM

## 2024-06-17 DIAGNOSIS — B9789 Other viral agents as the cause of diseases classified elsewhere: Secondary | ICD-10-CM | POA: Diagnosis present

## 2024-06-17 DIAGNOSIS — Z7984 Long term (current) use of oral hypoglycemic drugs: Secondary | ICD-10-CM

## 2024-06-17 DIAGNOSIS — Z6841 Body Mass Index (BMI) 40.0 and over, adult: Secondary | ICD-10-CM

## 2024-06-17 DIAGNOSIS — E782 Mixed hyperlipidemia: Secondary | ICD-10-CM | POA: Diagnosis present

## 2024-06-17 DIAGNOSIS — Z9981 Dependence on supplemental oxygen: Secondary | ICD-10-CM

## 2024-06-17 DIAGNOSIS — Z86711 Personal history of pulmonary embolism: Secondary | ICD-10-CM | POA: Diagnosis not present

## 2024-06-17 DIAGNOSIS — I69391 Dysphagia following cerebral infarction: Secondary | ICD-10-CM

## 2024-06-17 DIAGNOSIS — E1165 Type 2 diabetes mellitus with hyperglycemia: Secondary | ICD-10-CM | POA: Diagnosis present

## 2024-06-17 DIAGNOSIS — R339 Retention of urine, unspecified: Secondary | ICD-10-CM | POA: Diagnosis present

## 2024-06-17 DIAGNOSIS — Z1152 Encounter for screening for COVID-19: Secondary | ICD-10-CM | POA: Diagnosis not present

## 2024-06-17 DIAGNOSIS — A419 Sepsis, unspecified organism: Principal | ICD-10-CM | POA: Diagnosis present

## 2024-06-17 DIAGNOSIS — J9621 Acute and chronic respiratory failure with hypoxia: Secondary | ICD-10-CM | POA: Diagnosis present

## 2024-06-17 DIAGNOSIS — Z8673 Personal history of transient ischemic attack (TIA), and cerebral infarction without residual deficits: Secondary | ICD-10-CM

## 2024-06-17 DIAGNOSIS — J189 Pneumonia, unspecified organism: Secondary | ICD-10-CM | POA: Diagnosis not present

## 2024-06-17 DIAGNOSIS — Z95828 Presence of other vascular implants and grafts: Secondary | ICD-10-CM | POA: Diagnosis not present

## 2024-06-17 DIAGNOSIS — G4733 Obstructive sleep apnea (adult) (pediatric): Secondary | ICD-10-CM

## 2024-06-17 DIAGNOSIS — Z86718 Personal history of other venous thrombosis and embolism: Secondary | ICD-10-CM | POA: Diagnosis not present

## 2024-06-17 DIAGNOSIS — I350 Nonrheumatic aortic (valve) stenosis: Secondary | ICD-10-CM | POA: Diagnosis present

## 2024-06-17 DIAGNOSIS — I872 Venous insufficiency (chronic) (peripheral): Secondary | ICD-10-CM | POA: Diagnosis present

## 2024-06-17 DIAGNOSIS — I110 Hypertensive heart disease with heart failure: Secondary | ICD-10-CM | POA: Diagnosis present

## 2024-06-17 DIAGNOSIS — J440 Chronic obstructive pulmonary disease with acute lower respiratory infection: Secondary | ICD-10-CM | POA: Diagnosis present

## 2024-06-17 DIAGNOSIS — K219 Gastro-esophageal reflux disease without esophagitis: Secondary | ICD-10-CM | POA: Diagnosis present

## 2024-06-17 DIAGNOSIS — Z79899 Other long term (current) drug therapy: Secondary | ICD-10-CM

## 2024-06-17 DIAGNOSIS — E872 Acidosis, unspecified: Secondary | ICD-10-CM | POA: Diagnosis present

## 2024-06-17 DIAGNOSIS — F17210 Nicotine dependence, cigarettes, uncomplicated: Secondary | ICD-10-CM | POA: Diagnosis present

## 2024-06-17 DIAGNOSIS — Z7401 Bed confinement status: Secondary | ICD-10-CM

## 2024-06-17 DIAGNOSIS — E876 Hypokalemia: Secondary | ICD-10-CM | POA: Diagnosis present

## 2024-06-17 DIAGNOSIS — G8929 Other chronic pain: Secondary | ICD-10-CM | POA: Diagnosis present

## 2024-06-17 DIAGNOSIS — T502X5A Adverse effect of carbonic-anhydrase inhibitors, benzothiadiazides and other diuretics, initial encounter: Secondary | ICD-10-CM | POA: Diagnosis present

## 2024-06-17 DIAGNOSIS — I2510 Atherosclerotic heart disease of native coronary artery without angina pectoris: Secondary | ICD-10-CM | POA: Diagnosis present

## 2024-06-17 LAB — CBC WITH DIFFERENTIAL/PLATELET
Abs Immature Granulocytes: 0.05 K/uL (ref 0.00–0.07)
Basophils Absolute: 0 K/uL (ref 0.0–0.1)
Basophils Relative: 0 %
Eosinophils Absolute: 0.2 K/uL (ref 0.0–0.5)
Eosinophils Relative: 2 %
HCT: 39.9 % (ref 39.0–52.0)
Hemoglobin: 13.7 g/dL (ref 13.0–17.0)
Immature Granulocytes: 1 %
Lymphocytes Relative: 9 %
Lymphs Abs: 0.9 K/uL (ref 0.7–4.0)
MCH: 32 pg (ref 26.0–34.0)
MCHC: 34.3 g/dL (ref 30.0–36.0)
MCV: 93.2 fL (ref 80.0–100.0)
Monocytes Absolute: 0.5 K/uL (ref 0.1–1.0)
Monocytes Relative: 5 %
Neutro Abs: 8.2 K/uL — ABNORMAL HIGH (ref 1.7–7.7)
Neutrophils Relative %: 83 %
Platelets: 205 K/uL (ref 150–400)
RBC: 4.28 MIL/uL (ref 4.22–5.81)
RDW: 15.5 % (ref 11.5–15.5)
WBC: 9.8 K/uL (ref 4.0–10.5)
nRBC: 0 % (ref 0.0–0.2)

## 2024-06-17 LAB — COMPREHENSIVE METABOLIC PANEL WITH GFR
ALT: 16 U/L (ref 0–44)
AST: 22 U/L (ref 15–41)
Albumin: 3.2 g/dL — ABNORMAL LOW (ref 3.5–5.0)
Alkaline Phosphatase: 70 U/L (ref 38–126)
Anion gap: 12 (ref 5–15)
BUN: 5 mg/dL — ABNORMAL LOW (ref 8–23)
CO2: 29 mmol/L (ref 22–32)
Calcium: 8.5 mg/dL — ABNORMAL LOW (ref 8.9–10.3)
Chloride: 97 mmol/L — ABNORMAL LOW (ref 98–111)
Creatinine, Ser: 0.64 mg/dL (ref 0.61–1.24)
GFR, Estimated: >60 mL/min (ref >60)
Glucose, Bld: 205 mg/dL — ABNORMAL HIGH (ref 70–99)
Potassium: 3.8 mmol/L (ref 3.5–5.1)
Sodium: 138 mmol/L (ref 135–145)
Total Bilirubin: 1.3 mg/dL — ABNORMAL HIGH (ref 0.0–1.2)
Total Protein: 6.6 g/dL (ref 6.5–8.1)

## 2024-06-17 LAB — URINALYSIS, W/ REFLEX TO CULTURE (INFECTION SUSPECTED)
Bacteria, UA: NONE SEEN
Bilirubin Urine: NEGATIVE
Glucose, UA: >=500 mg/dL — AB
Ketones, ur: NEGATIVE mg/dL
Leukocytes,Ua: NEGATIVE
Nitrite: NEGATIVE
Protein, ur: 100 mg/dL — AB
Specific Gravity, Urine: 1.021 (ref 1.005–1.030)
pH: 5 (ref 5.0–8.0)

## 2024-06-17 LAB — BRAIN NATRIURETIC PEPTIDE: B Natriuretic Peptide: 504.3 pg/mL — ABNORMAL HIGH (ref 0.0–100.0)

## 2024-06-17 LAB — I-STAT CG4 LACTIC ACID, ED
Lactic Acid, Venous: 2.8 mmol/L (ref 0.5–1.9)
Lactic Acid, Venous: 1.3 mmol/L (ref 0.5–1.9)

## 2024-06-17 LAB — RESP PANEL BY RT-PCR (RSV, FLU A&B, COVID)  RVPGX2
Influenza A by PCR: NEGATIVE
Influenza B by PCR: NEGATIVE
Resp Syncytial Virus by PCR: NEGATIVE
SARS Coronavirus 2 by RT PCR: NEGATIVE

## 2024-06-17 LAB — PROTIME-INR
INR: 1.1 (ref 0.8–1.2)
Prothrombin Time: 14.3 s (ref 11.4–15.2)

## 2024-06-17 MED ORDER — LACTATED RINGERS IV SOLN: INTRAVENOUS | Status: DC

## 2024-06-17 MED ORDER — AMANTADINE HCL 100 MG PO CAPS
100.0000 mg | ORAL_CAPSULE | Freq: Every day | ORAL | Status: DC
  Filled 2024-06-17: qty 1

## 2024-06-17 MED ORDER — ONDANSETRON HCL 4 MG PO TABS: 4.0000 mg | ORAL_TABLET | Freq: Four times a day (QID) | ORAL | Status: DC | PRN

## 2024-06-17 MED ORDER — FUROSEMIDE 10 MG/ML IJ SOLN
40.0000 mg | Freq: Once | INTRAMUSCULAR | Status: AC
  Administered 2024-06-17: 40 mg via INTRAVENOUS
  Filled 2024-06-17: qty 4

## 2024-06-17 MED ORDER — SENNOSIDES-DOCUSATE SODIUM 8.6-50 MG PO TABS: 1.0000 | ORAL_TABLET | Freq: Every evening | ORAL | Status: DC | PRN

## 2024-06-17 MED ORDER — ACETAMINOPHEN 325 MG PO TABS
650.0000 mg | ORAL_TABLET | Freq: Four times a day (QID) | ORAL | Status: DC | PRN
  Administered 2024-06-18: 650 mg via ORAL
  Filled 2024-06-17: qty 2

## 2024-06-17 MED ORDER — LACTATED RINGERS IV BOLUS (SEPSIS)
2000.0000 mL | Freq: Once | INTRAVENOUS | Status: AC
  Administered 2024-06-17: 2000 mL via INTRAVENOUS

## 2024-06-17 MED ORDER — ONDANSETRON HCL 4 MG/2ML IJ SOLN
4.0000 mg | Freq: Four times a day (QID) | INTRAMUSCULAR | Status: DC | PRN
  Filled 2024-06-17: qty 2

## 2024-06-17 MED ORDER — EZETIMIBE 10 MG PO TABS
10.0000 mg | ORAL_TABLET | Freq: Every day | ORAL | Status: DC
  Administered 2024-06-18 – 2024-06-22 (×5): 10 mg via ORAL
  Filled 2024-06-17 (×5): qty 1

## 2024-06-17 MED ORDER — METRONIDAZOLE 500 MG/100ML IV SOLN
500.0000 mg | Freq: Once | INTRAVENOUS | Status: AC
  Administered 2024-06-17: 500 mg via INTRAVENOUS
  Filled 2024-06-17: qty 100

## 2024-06-17 MED ORDER — VANCOMYCIN HCL IN DEXTROSE 1-5 GM/200ML-% IV SOLN: 1000.0000 mg | Freq: Once | INTRAVENOUS | Status: DC

## 2024-06-17 MED ORDER — DULOXETINE HCL 30 MG PO CPEP
30.0000 mg | ORAL_CAPSULE | Freq: Every day | ORAL | Status: DC
  Administered 2024-06-18 – 2024-06-22 (×5): 30 mg via ORAL
  Filled 2024-06-17 (×5): qty 1

## 2024-06-17 MED ORDER — BISACODYL 5 MG PO TBEC: 5.0000 mg | DELAYED_RELEASE_TABLET | Freq: Every day | ORAL | Status: DC | PRN

## 2024-06-17 MED ORDER — ACETAMINOPHEN 500 MG PO TABS
1000.0000 mg | ORAL_TABLET | Freq: Once | ORAL | Status: AC
  Administered 2024-06-17: 1000 mg via ORAL
  Filled 2024-06-17: qty 2

## 2024-06-17 MED ORDER — ACETAMINOPHEN 650 MG RE SUPP: 650.0000 mg | Freq: Four times a day (QID) | RECTAL | Status: DC | PRN

## 2024-06-17 MED ORDER — LORATADINE 10 MG PO TABS
10.0000 mg | ORAL_TABLET | Freq: Every day | ORAL | Status: DC
  Administered 2024-06-18 – 2024-06-22 (×5): 10 mg via ORAL
  Filled 2024-06-17 (×6): qty 1

## 2024-06-17 MED ORDER — HYDROCODONE-ACETAMINOPHEN 7.5-325 MG PO TABS
1.0000 | ORAL_TABLET | Freq: Three times a day (TID) | ORAL | Status: DC | PRN
  Administered 2024-06-18 – 2024-06-20 (×2): 1 via ORAL
  Filled 2024-06-17 (×2): qty 1

## 2024-06-17 MED ORDER — ATORVASTATIN CALCIUM 40 MG PO TABS
40.0000 mg | ORAL_TABLET | Freq: Every day | ORAL | Status: DC
  Administered 2024-06-18 – 2024-06-22 (×5): 40 mg via ORAL
  Filled 2024-06-17 (×5): qty 1

## 2024-06-17 MED ORDER — VANCOMYCIN HCL 2000 MG/400ML IV SOLN
2000.0000 mg | Freq: Once | INTRAVENOUS | Status: AC
  Administered 2024-06-17: 2000 mg via INTRAVENOUS
  Filled 2024-06-17: qty 400

## 2024-06-17 MED ORDER — PANTOPRAZOLE SODIUM 40 MG PO TBEC
40.0000 mg | DELAYED_RELEASE_TABLET | Freq: Every day | ORAL | Status: DC
  Administered 2024-06-18 – 2024-06-22 (×5): 40 mg via ORAL
  Filled 2024-06-17 (×5): qty 1

## 2024-06-17 MED ORDER — SODIUM CHLORIDE 0.9 % IV SOLN
2.0000 g | Freq: Once | INTRAVENOUS | Status: AC
  Administered 2024-06-17: 2 g via INTRAVENOUS
  Filled 2024-06-17: qty 12.5

## 2024-06-17 NOTE — ED Triage Notes (Signed)
 Pt here  from home with c/o weakness and fever , sats  were found to be 77 % on room air , placed on 4 liters , cbg 250 , pt alert and oriented on arrival

## 2024-06-17 NOTE — ED Provider Notes (Signed)
 [place] [facility] AT [facility] Provider Note   CSN: [id number] Arrival date & time: 06/17/24  1655     Patient presents with: Weakness and Fever   Darren Hunt is a 73 y.o. male.   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 Currently on 4L with sats of 95%. Denies CP, or SOB, nausea, vomiting. Also, has an indwelling foley catheter with recent E coli positive cultures. Last Foley exchange was about 4 weeks ago. No abdominal pain, diarrhea.   The history is provided by the patient. No language interpreter was used.  Weakness Associated symptoms: fever   Fever      Prior to Admission medications   Medication Sig Start Date End Date Taking? Authorizing Provider  atorvastatin  (LIPITOR) 40 MG tablet Take 1 tablet (40 mg total) by mouth daily. 05/10/24   Ghimire, Donalda HERO, MD  DULoxetine  (CYMBALTA ) 30 MG capsule Take 1 capsule (30 mg total) by mouth daily. 05/10/24   Ghimire, Donalda HERO, MD  HYDROcodone -acetaminophen  (NORCO) 7.5-325 MG tablet Take 1 tablet by mouth 3 (three) times daily as needed for moderate pain (pain score 4-6). 05/10/24   Ghimire, Donalda HERO, MD    Allergies: Heparin     Review of Systems  Constitutional:  Positive for fever.  Neurological:  Positive for weakness.    Updated Vital Signs BP 128/60   Pulse (!) 105   Temp 99.4 F (37.4 C) (Oral)   Resp (!) 25   SpO2 95%   Physical Exam Vitals and nursing note reviewed.  Constitutional:      Appearance: Normal appearance. He is obese.  HENT:     Head: Normocephalic.     Mouth/Throat:     Mouth: Mucous membranes are moist.  Cardiovascular:     Rate and Rhythm: Tachycardia present.     Heart sounds: No murmur heard. Pulmonary:     Effort: Pulmonary effort is normal.     Breath sounds: No wheezing, rhonchi or rales (Mild bibasilar rales.).   Abdominal:     General: There is no distension.     Palpations: Abdomen is soft.     Tenderness: There is no abdominal tenderness.  Genitourinary:    Comments: Indwelling foley in place, clear urine in collection bag.  Musculoskeletal:     Cervical back: Normal range of motion and neck supple.     Right lower leg: No edema.     Left lower leg: No edema.  Skin:    General: Skin is warm and dry.  Neurological:     Mental Status: He is alert and oriented to person, place, and time.     (all labs ordered are listed, but only abnormal results are displayed) Labs Reviewed  COMPREHENSIVE METABOLIC PANEL WITH GFR - Abnormal; Notable for the following components:      Result Value   Chloride 97 (*)    Glucose, Bld 205 (*)    BUN 5 (*)    Calcium  8.5 (*)    Albumin 3.2 (*)    Total Bilirubin 1.3 (*)    All other components within normal limits  CBC WITH DIFFERENTIAL/PLATELET - Abnormal; Notable for the following components:   Neutro Abs 8.2 (*)    All other components within normal limits  URINALYSIS, W/ REFLEX TO CULTURE (INFECTION SUSPECTED) - Abnormal; Notable for the following components:   Glucose,  UA >=500 (*)    Hgb urine dipstick SMALL (*)    Protein, ur 100 (*)    All other components within normal limits  I-STAT CG4 LACTIC ACID, ED - Abnormal; Notable for the following components:   Lactic Acid, Venous 2.8 (*)    All other components within normal limits  RESP PANEL BY RT-PCR (RSV, FLU A&B, COVID)  RVPGX2  CULTURE, BLOOD (ROUTINE X 2)  CULTURE, BLOOD (ROUTINE X 2)  RESPIRATORY PANEL BY PCR  PROTIME-INR  BRAIN NATRIURETIC PEPTIDE  I-STAT CG4 LACTIC ACID, ED    EKG: EKG Interpretation Date/Time:  Friday June 17 2024 17:04:25 EST Ventricular Rate:  114 PR Interval:  169 QRS Duration:  129 QT Interval:  331 QTC Calculation: 456 R Axis:   101  Text Interpretation: Sinus tachycardia No significant change since last tracing Ventricular trigeminy Nonspecific  intraventricular conduction delay Borderline T abnormalities, inferior leads ST elevation, consider anterior injury Confirmed by Randol Simmonds [phone]) on 06/17/2024 5:13:00 PM  Radiology: [facility] Chest Port 1 View Result Date: 06/17/2024 EXAM: 1 VIEW(S) XRAY OF THE CHEST 06/17/2024 06:01:00 PM COMPARISON: Comparison with 05/07/2024. CLINICAL HISTORY: Questionable sepsis - evaluate for abnormality. FINDINGS: LUNGS AND PLEURA: Pulmonary vascular congestion and perihilar/peripheral interstitial changes, likely edema. This is developing since the previous study. Small left pleural effusion with basilar atelectasis. No pneumothorax. HEART AND MEDIASTINUM: Cardiac enlargement. BONES AND SOFT TISSUES: No acute osseous abnormality. IMPRESSION: 1. Cardiomegaly with pulmonary vascular congestion and perihilar/peripheral interstitial changes, likely edema, developing since the prior study. 2. Small left pleural effusion with basilar atelectasis. 3. No pneumothorax. Electronically signed by: Elsie Gravely MD 06/17/2024 06:12 PM EST RP Workstation: [id number]     .Critical Care  Performed by: Odell Balls, PA-C Authorized by: Odell Balls, PA-C   Critical care provider statement:    Critical care time (minutes):  50   Critical care was necessary to treat or prevent imminent or life-threatening deterioration of the following conditions:  Sepsis   Critical care was time spent personally by me on the following activities:  Development of treatment plan with patient or surrogate, discussions with consultants, evaluation of patient's response to treatment, examination of patient, interpretation of cardiac output measurements, ordering and review of laboratory studies, ordering and review of radiographic studies, pulse oximetry, re-evaluation of patient's condition and review of old charts    Medications Ordered in the ED  lactated ringers  infusion ( Intravenous New Bag/Given 06/17/24 1841)  vancomycin  (VANCOREADY)  IVPB 2000 mg/400 mL (2,000 mg Intravenous New Bag/Given 06/17/24 1920)  lactated ringers  bolus 2,000 mL (0 mLs Intravenous Stopped 06/17/24 1841)  ceFEPIme  (MAXIPIME ) 2 g in sodium chloride  0.9 % 100 mL IVPB (0 g Intravenous Stopped 06/17/24 1835)  metroNIDAZOLE  (FLAGYL ) IVPB 500 mg (0 mg Intravenous Stopped 06/17/24 1917)  acetaminophen  (TYLENOL ) tablet 1,000 mg (1,000 mg Oral Given 06/17/24 1742)    Clinical Course as of 06/17/24 2002  Fri Jun 17, 2024  1955 Patient to ED with onset last night of cough and fever. Found to be hypoxic reporting 77% on RA, stable on 4L. No vomiting. He denies SoB. Complicated medical history with recent admissions. VS meeting evolving sepsis criteria.   Foley exchanged in ED to obtain UA specimen, which is negative for infection. Likely source is pulmonary. CXR showing pulmonary vascular congestion. H/O CHF but no subjective SOB, peripheral edema today.   Wife at bedside states episode of choking last night before symptoms started. Consider aspiration PNA. ABX started, cultures pending. LR bolus  2000 ml.   Discussed with [facility], who accepts for admission.   [SU]    Clinical Course User Index [SU] Odell Balls, PA-C                                 Medical Decision Making Amount and/or Complexity of Data Reviewed Labs: ordered. Radiology: ordered.  Risk OTC drugs. Prescription drug management. Decision regarding hospitalization.        Final diagnoses:  Community acquired pneumonia, unspecified laterality  Sepsis, due to unspecified organism, unspecified whether acute organ dysfunction present [facility])  Hypoxia    ED Discharge Orders     None          Odell Balls RIGGERS 06/17/24 VITO Randol Simmonds, MD 06/19/24 [phone]

## 2024-06-17 NOTE — H&P (Incomplete)
 History and Physical  Darren Hunt FMW:[id number] DOB: Dec 26, 1950 DOA: 06/17/2024  PCP: Nadine Columbus, FNP   Chief Complaint: Weakness, fever  HPI: Darren Hunt is a 73 y.o. 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  Per spouse, patient was coughing a lot last night.  Patient reports he has postnasal drip and has been taking Zyrtec for this. Reports he has been feeling cold all day with intermittent subjective fevers. The nurse was called to check on him and due to signs of cyanosis, EMS was called.  On their arrival, patient was found to be hypoxic to 77%, he was placed on 4 L [place] and transported to the ER.  He reports occasional shortness of breath but denies any chest pain, abdominal pain, nausea, vomiting, confusion, dizziness or dysuria.  Patient reports he has been bedbound since his stroke but currently has home health PT/OT/SLP and RN.  Reports that he is currently pending a repeat CT scan of his head and noted to ensure there are no further hemorrhage before initiating anticoagulation.   ED Course: Initial vitals show temp 102, RR 20-26, HR 90-100, SBP 100-140s, SpO2 95% on 4 L. Initial labs significant for BNP 504, glucose 205, lactic acid 2.8-1.3, normal renal function, LFTs and CBC, UA with no signs of infection, negative flu, RSV and COVID test. CXR shows cardiomegaly with pulmonary vascular congestion and perihilar/peripheral interstitial changes likely edema. Sepsis protocol was activated and patient received Tylenol , IV LR 2 L bolus, IV cefepime , IV  vancomycin  and IV Flagyl .  Foley catheter exchanged in the ED.  [facility] was consulted for admission.   Review of Systems: Please see HPI for pertinent positives and negatives. A complete 10 system review of systems are otherwise negative.  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   Aortic root dilatation 08/27/2021   Aortic valve stenosis 10/14/2023   At risk for heart failure 01/29/2022   Bilateral carotid artery stenosis 08/12/2016   CAD (coronary artery disease) 01/11/2024   Candidal intertrigo 02/06/2022   Cellulitis of right leg 08/05/2021   Chronic diarrhea 05/26/2017   Chronic hypoxemic respiratory failure (HCC) 08/11/2023   Chronic obstructive lung disease (HCC) 08/27/2021   Chronic pain of both hips 12/29/2018   Chronic pain of both knees 07/11/2016   Chronic pain of both shoulders 12/29/2018   Chronic ulcer of right foot (HCC) 08/27/2021   Congestive heart failure (HCC) 07/06/2019   Continuous opioid dependence (HCC) 08/10/2023   Contracture of both Achilles tendons 02/01/2018   Essential hypertension 02/10/2012   Hardening of the aorta (main artery of the heart) 12/03/2021   Heart murmur 09/03/2023   History of pulmonary embolism 08/27/2021   Joint pain 08/27/2021   Microalbuminuria due to type 2 diabetes mellitus (HCC) 07/25/2022   Mixed hyperlipidemia 07/11/2016   Morbid obesity (HCC) 07/06/2019   Obstructive lung disease (HCC) 07/06/2019   Obstructive sleep apnea (adult) (pediatric) 05/26/2017   Onychomycosis due to dermatophyte 09/28/2018   Osteoarthritis of right knee  12/24/2021   Other specified personal risk factors, not elsewhere classified 12/03/2021   Pre-ulcerative calluses 02/01/2018   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 (HCC) 08/27/2021   Pulmonary embolism (HCC) 07/06/2019   In 2005 after he broke  both his legs in an accident. On coumadin for 3 months.     Last Assessment & Plan:   Relevant Hx:  Course:  Daily Update:  Today's Plan:     Shortness of breath 02/10/2012   Smoking addiction 01/11/2024   Statin not tolerated 12/31/2021   Tobacco abuse 07/06/2019   Active tobacco use 2 packs/day x 34 years     Type 2 diabetes mellitus (HCC) 02/10/2012   A. With retinopathy and neuropathy  B. Long-term use of insulin      Type 2 diabetes mellitus with diabetic neuropathy, with long-term current use of insulin  (HCC) 07/11/2016   Ulcer of midfoot due to diabetes mellitus (HCC) 12/03/2021   Venous insufficiency of right leg 08/05/2021   Venous stasis 08/12/2016   Past Surgical History:  Procedure Laterality Date   IVC FILTER INSERTION  05/04/2024   [facility], [address] [place], [place] [address]   Social History:  reports that he has been smoking cigarettes. He has never used smokeless tobacco. No history on file for alcohol use and drug use.  Allergies  Allergen Reactions   Heparin      Agitation    History reviewed. No pertinent family history.   Prior to Admission medications   Medication Sig Start Date End Date Taking? Authorizing Provider  Acetaminophen  (TYLENOL  PO) Take 2 tablets by mouth as needed.   Yes [provider]  albuterol  (VENTOLIN  HFA) 108 (90 Base) MCG/ACT inhaler Inhale 2 puffs into the lungs every 4 (four) hours as needed for wheezing or shortness of breath.   Yes [provider]  amantadine  (SYMMETREL ) 100 MG capsule Take 100 mg by mouth daily.   Yes [provider]  atorvastatin  (LIPITOR) 40 MG tablet Take 1 tablet (40 mg total) by mouth daily. 05/10/24  Yes Ghimire, Donalda HERO, MD  Cetirizine HCl (ZYRTEC PO) Take 1 tablet by mouth daily.   Yes [provider]  DULoxetine  (CYMBALTA ) 30 MG capsule Take 1 capsule (30 mg total) by mouth daily. 05/10/24  Yes Ghimire, Donalda HERO, MD  ezetimibe  (ZETIA ) 10 MG tablet  Take 10 mg by mouth daily.   Yes [provider]  HYDROcodone -acetaminophen  (NORCO) 7.5-325 MG tablet Take 1 tablet by mouth 3 (three) times daily as needed for moderate pain (pain score 4-6). 05/10/24  Yes Ghimire, Donalda HERO, MD  metFORMIN  (GLUCOPHAGE -XR) 500 MG 24 hr tablet Take 500 mg by mouth 2 (two) times daily. 06/13/24  Yes [provider]  pantoprazole  (PROTONIX ) 40 MG tablet Take 40 mg by mouth daily.   Yes [provider]    Physical Exam: BP (!) 102/54   Pulse 97   Temp 99.4 F (37.4 C) (Oral)   Resp 17   SpO2 95%  General: Pleasant, chronically ill and weak appearing morbidly obese man laying in bed. No acute distress. HEENT: [place]/AT. Anicteric sclera CV: Tachycardic. Regular rhythm. No murmurs, rubs, or gallops. Trace LE edema. Pulmonary: On 3 L [place]. Lungs CTAB. Normal effort. Bibasilar rales. Decreased breath sounds throughout. Abdominal: Soft, NT/ND. Abdominal wall edema. Normal bowel sounds. Extremities: Palpable radial and DP pulses. Normal ROM. Mild edema to the left hand Skin: Warm and dry. No obvious rash or lesions.  Neuro: A&Ox3. Moves all extremities. Left hemiparesis. Normal sensation to light touch. No focal deficit. Psych: Normal mood and affect          Labs on Admission:  Basic Metabolic Panel: Recent Labs  Lab 06/17/24 1727  NA 138  K 3.8  CL 97*  CO2 29  GLUCOSE 205*  BUN 5*  CREATININE 0.64  CALCIUM  8.5*   Liver Function Tests: Recent Labs  Lab 06/17/24 1727  AST 22  ALT 16  ALKPHOS 70  BILITOT 1.3*  PROT 6.6  ALBUMIN 3.2*   No results for input(s): LIPASE, AMYLASE in the last 168 hours. No results for input(s): AMMONIA in the last 168 hours. CBC: Recent Labs  Lab 06/17/24 1727  WBC 9.8  NEUTROABS 8.2*  HGB 13.7  HCT 39.9  MCV 93.2  PLT 205   Cardiac Enzymes: No results for input(s): CKTOTAL, CKMB, CKMBINDEX, TROPONINI in the last 168 hours. BNP (last 3 results) Recent Labs     06/17/24 1727  BNP 504.3*    ProBNP (last 3 results) No results for input(s): PROBNP in the last 8760 hours.  CBG: No results for input(s): GLUCAP in the last 168 hours.  Radiological Exams on Admission: DG Chest Port 1 View Result Date: 06/17/2024 EXAM: 1 VIEW(S) XRAY OF THE CHEST 06/17/2024 06:01:00 PM COMPARISON: Comparison with 05/07/2024. CLINICAL HISTORY: Questionable sepsis - evaluate for abnormality. FINDINGS: LUNGS AND PLEURA: Pulmonary vascular congestion and perihilar/peripheral interstitial changes, likely edema. This is developing since the previous study. Small left pleural effusion with basilar atelectasis. No pneumothorax. HEART AND MEDIASTINUM: Cardiac enlargement. BONES AND SOFT TISSUES: No acute osseous abnormality. IMPRESSION: 1. Cardiomegaly with pulmonary vascular congestion and perihilar/peripheral interstitial changes, likely edema, developing since the prior study. 2. Small left pleural effusion with basilar atelectasis. 3. No pneumothorax. Electronically signed by: Elsie Gravely MD 06/17/2024 06:12 PM EST RP Workstation: [id number]   My independent interpretation of EKG: Sinus tach with ventricular trigeminy and nonspecific ST and T wave changes  Assessment/Plan Darren Hunt is a 73 y.o. 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  # Sepsis - Presented with generalized weakness, cough and fever - UA with no signs of infection, source of fever presumed to be respiratory etiology such as  CAP or aspiration pneumonia - Met sepsis criteria with fever, tachypnea, tachycardia, lactic acidosis presenting respiratory infection - Patient recently hospitalized, will continue IV vancomycin  and cefepime  for possible HCAP or bacteremia - Discontinue IV fluids due to acute CHF - Follow-up blood culture - Trend CBC, fever curve  # Acute on chronic diastolic HF - Last TTE on 05/09/2024 showed EF 55-60% - Pt presented with generalized weakness, intermittent shortness of breath and some mild cough - Pt with clinical, radiological and laboratory signs of CHF exacerbation - Patient has received >3 L of IVF in the ED - Start IV lasix  40 mg twice daily, adjust as needed for adequate urine output - Strict I&O, daily weights - Maintain K+ > 4.0, Mag > 2.0 - Telemetry  # History of CVA - On review of  neurology notes on November 12, patient had a large right MCA infarct in October 2025 thought to be cryptogenic in etiology.  He has a history of concurrent DVT and pulmonary embolism but not anticoagulation due to hemorrhagic transformation in his infarct and subarachnoid hemorrhage. - Neurology recommended stat CT head and if hemorrhagic changes have resolved the patient can start Eliquis  for DVT and PE for secondary stroke prevention - Per patient and spouse, patient has not been able to get his CT done in almost 2 weeks - Will order CT of the head - Will need neurology consult before initiating anticoagulation - PT/OT eval and treat  # History of PE/DVT - History of DVT and PE in 2023 and previously on warfarin for few months - Currently not on any anticoagulation due to CVA with hemorrhagic transformation - Follow-up CT head and neurology recommendation  # Acute on chronic chronic hypoxic respiratory failure - Reports he is on 2 to 3 L of  at bedtime - Found to have increased O2 requirement to 4 L on admission likely secondary to CHF exacerbation - Continue [place] O2, wean as able  #  T2DM with hyperglycemia - Well-controlled with recent A1c of 6.7% last month - Hold metformin  in the setting of lactic acidosis - SSI with meals, CBG monitoring  # HTN - BP relatively stable with SBP in the 120s to 140s - Patient off BP meds for unknown reason  # Hx of urinary retention - Status post indwelling Foley catheter, exchanged in the ED - UA does not show any signs of infection - Continue Foley  # Hx of dysphagia - Secondary to his recent stroke - Will place on soft diet and consult SLP for evaluation  # HLD - Continue atorvastatin  and Zetia   # GERD - Continue Protonix   # Mood disorder - Continue amantadine  and duloxetine   # COPD - Chronic and stable - As needed DuoNeb  # OSA - Continue CPAP at bedtime  # Morbid obesity - Check weight and height to calculate BMI - F/u with PCP for weight lost and nutrition counseling   DVT prophylaxis: TED hose    Code Status: Limited: Do not attempt resuscitation (DNR) -DNR-LIMITED -Do Not Intubate/DNI   Consults called: None  Family Communication: Discussed results/findings and plan for admission with spouse at bedside  Severity of Illness: The appropriate patient status for this patient is INPATIENT. Inpatient status is judged to be reasonable and necessary in order to provide the required intensity of service to ensure the patient's safety. The patient's presenting symptoms, physical exam findings, and initial radiographic and laboratory data in the context of their chronic comorbidities is felt to place them at high risk for further clinical deterioration. Furthermore, it is not anticipated that the patient will be medically stable for discharge from the hospital within 2 midnights of admission.   * I certify that at the point of admission it is my clinical judgment that the patient will require inpatient hospital care spanning beyond 2 midnights from the point of admission due to high intensity of service, high risk  for further deterioration and high frequency of surveillance required.*  Level of care: Telemetry   I personally spent a total of 80 minutes in the care of the patient today including preparing to see the patient, getting/reviewing separately obtained history, performing a medically appropriate exam/evaluation, placing orders, documenting clinical information in the EHR, independently interpreting results, and communicating results.   Lou Claretta HERO, MD 06/17/2024, 10:31 PM  [facility] Pager: [phone] Isaiah [phone]   If 7PM-7AM, please contact night-coverage [email or web address] Password [email or web address]

## 2024-06-17 NOTE — Sepsis Progress Note (Signed)
 eLink is following this Code Sepsis.

## 2024-06-18 ENCOUNTER — Other Ambulatory Visit: Payer: Self-pay

## 2024-06-18 ENCOUNTER — Inpatient Hospital Stay (HOSPITAL_COMMUNITY)

## 2024-06-18 DIAGNOSIS — E1165 Type 2 diabetes mellitus with hyperglycemia: Secondary | ICD-10-CM

## 2024-06-18 DIAGNOSIS — Z8673 Personal history of transient ischemic attack (TIA), and cerebral infarction without residual deficits: Secondary | ICD-10-CM

## 2024-06-18 DIAGNOSIS — I5033 Acute on chronic diastolic (congestive) heart failure: Secondary | ICD-10-CM

## 2024-06-18 DIAGNOSIS — A419 Sepsis, unspecified organism: Secondary | ICD-10-CM | POA: Diagnosis not present

## 2024-06-18 DIAGNOSIS — J9621 Acute and chronic respiratory failure with hypoxia: Secondary | ICD-10-CM

## 2024-06-18 LAB — GLUCOSE, CAPILLARY
Glucose-Capillary: 210 mg/dL — ABNORMAL HIGH (ref 70–99)
Glucose-Capillary: 184 mg/dL — ABNORMAL HIGH (ref 70–99)
Glucose-Capillary: 149 mg/dL — ABNORMAL HIGH (ref 70–99)

## 2024-06-18 LAB — BASIC METABOLIC PANEL WITH GFR
Anion gap: 13 (ref 5–15)
BUN: 6 mg/dL — ABNORMAL LOW (ref 8–23)
CO2: 24 mmol/L (ref 22–32)
Calcium: 8.1 mg/dL — ABNORMAL LOW (ref 8.9–10.3)
Chloride: 97 mmol/L — ABNORMAL LOW (ref 98–111)
Creatinine, Ser: 0.68 mg/dL (ref 0.61–1.24)
GFR, Estimated: >60 mL/min (ref >60)
Glucose, Bld: 192 mg/dL — ABNORMAL HIGH (ref 70–99)
Potassium: 3.2 mmol/L — ABNORMAL LOW (ref 3.5–5.1)
Sodium: 134 mmol/L — ABNORMAL LOW (ref 135–145)

## 2024-06-18 LAB — CBC
HCT: 35.3 % — ABNORMAL LOW (ref 39.0–52.0)
Hemoglobin: 12 g/dL — ABNORMAL LOW (ref 13.0–17.0)
MCH: 31.4 pg (ref 26.0–34.0)
MCHC: 34 g/dL (ref 30.0–36.0)
MCV: 92.4 fL (ref 80.0–100.0)
Platelets: 176 K/uL (ref 150–400)
RBC: 3.82 MIL/uL — ABNORMAL LOW (ref 4.22–5.81)
RDW: 15.5 % (ref 11.5–15.5)
WBC: 10.3 K/uL (ref 4.0–10.5)
nRBC: 0 % (ref 0.0–0.2)

## 2024-06-18 LAB — HEPARIN LEVEL (UNFRACTIONATED): Heparin Unfractionated: 0.13 [IU]/mL — ABNORMAL LOW (ref 0.30–0.70)

## 2024-06-18 LAB — MAGNESIUM: Magnesium: 1.1 mg/dL — ABNORMAL LOW (ref 1.7–2.4)

## 2024-06-18 LAB — CBG MONITORING, ED
Glucose-Capillary: 204 mg/dL — ABNORMAL HIGH (ref 70–99)
Glucose-Capillary: 205 mg/dL — ABNORMAL HIGH (ref 70–99)
Glucose-Capillary: 202 mg/dL — ABNORMAL HIGH (ref 70–99)

## 2024-06-18 LAB — MRSA NEXT GEN BY PCR, NASAL: MRSA by PCR Next Gen: NOT DETECTED

## 2024-06-18 MED ORDER — CHLORHEXIDINE GLUCONATE CLOTH 2 % EX PADS
6.0000 | MEDICATED_PAD | Freq: Every day | CUTANEOUS | Status: DC
  Administered 2024-06-18 – 2024-06-22 (×5): 6 via TOPICAL

## 2024-06-18 MED ORDER — HEPARIN (PORCINE) 25000 UT/250ML-% IV SOLN
2100.0000 [IU]/h | INTRAVENOUS | Status: AC
  Administered 2024-06-18: 1400 [IU]/h via INTRAVENOUS
  Administered 2024-06-19: 1600 [IU]/h via INTRAVENOUS
  Administered 2024-06-19 – 2024-06-20 (×2): 2100 [IU]/h via INTRAVENOUS
  Filled 2024-06-18 (×4): qty 250

## 2024-06-18 MED ORDER — IPRATROPIUM-ALBUTEROL 0.5-2.5 (3) MG/3ML IN SOLN
3.0000 mL | Freq: Four times a day (QID) | RESPIRATORY_TRACT | Status: DC | PRN
  Administered 2024-06-18: 3 mL via RESPIRATORY_TRACT
  Filled 2024-06-18 (×2): qty 3

## 2024-06-18 MED ORDER — VANCOMYCIN HCL 1500 MG/300ML IV SOLN
1500.0000 mg | Freq: Two times a day (BID) | INTRAVENOUS | Status: DC
  Administered 2024-06-18 – 2024-06-19 (×3): 1500 mg via INTRAVENOUS
  Filled 2024-06-18 (×3): qty 300

## 2024-06-18 MED ORDER — SODIUM CHLORIDE 0.9 % IV SOLN
2.0000 g | Freq: Three times a day (TID) | INTRAVENOUS | Status: DC
  Administered 2024-06-18 – 2024-06-19 (×3): 2 g via INTRAVENOUS
  Filled 2024-06-18 (×4): qty 12.5

## 2024-06-18 MED ORDER — FUROSEMIDE 10 MG/ML IJ SOLN
40.0000 mg | Freq: Two times a day (BID) | INTRAMUSCULAR | Status: DC
  Administered 2024-06-18 – 2024-06-20 (×5): 40 mg via INTRAVENOUS
  Filled 2024-06-18 (×5): qty 4

## 2024-06-18 MED ORDER — AMANTADINE HCL 50 MG/5ML PO SOLN
50.0000 mg | Freq: Every day | ORAL | Status: AC
  Administered 2024-06-19: 50 mg via ORAL
  Filled 2024-06-18 (×2): qty 5

## 2024-06-18 MED ORDER — INSULIN ASPART 100 UNIT/ML IJ SOLN
0.0000 [IU] | Freq: Three times a day (TID) | INTRAMUSCULAR | Status: DC
  Administered 2024-06-18: 5 [IU] via SUBCUTANEOUS
  Administered 2024-06-18: 3 [IU] via SUBCUTANEOUS
  Administered 2024-06-18: 5 [IU] via SUBCUTANEOUS
  Administered 2024-06-19 (×2): 3 [IU] via SUBCUTANEOUS
  Administered 2024-06-19: 2 [IU] via SUBCUTANEOUS
  Administered 2024-06-20 – 2024-06-22 (×7): 3 [IU] via SUBCUTANEOUS
  Filled 2024-06-18 (×9): qty 3
  Filled 2024-06-18: qty 5
  Filled 2024-06-18: qty 2
  Filled 2024-06-18: qty 3
  Filled 2024-06-18: qty 5

## 2024-06-18 MED ORDER — POTASSIUM CHLORIDE CRYS ER 20 MEQ PO TBCR
40.0000 meq | EXTENDED_RELEASE_TABLET | Freq: Every day | ORAL | Status: DC
  Administered 2024-06-18: 40 meq via ORAL
  Filled 2024-06-18: qty 2

## 2024-06-18 NOTE — ED Notes (Signed)
 Assessed pt. He was A&OX3.States he is starting to feel better, except for the pain in his lower left leg, Otherwise, seems to be trending back to baseline

## 2024-06-18 NOTE — ED Notes (Signed)
 Sleeping, calm, NAD, VSS, heparin  infusing, tachypneic, skin W&D.

## 2024-06-18 NOTE — Progress Notes (Signed)
 Pharmacy Antibiotic Note  Darren Hunt is a 73 y.o. male admitted on 06/17/2024 with weakness/fever.  Pharmacy has been consulted for cefepime /vancomycin  dosing for sepsis concerns. Patient with indwelling foley cath as well.  -WBC WNL, sCr 0.64 (~bl), Tmax 102.4 -Blood cultures collected; resp panel negative, no MRSA PCR -CXR: small left pleural effusion w/ atelectasis   Plan: -Cefepime  2g IV every 8 hours -Vancomycin  2g IV x1 -Vancomycin  1500mg  IV every 12 hours (AUC 483, Vd 0.5, IBW, sCr 0.8) -Order MRSA PCR -Monitor renal function -Follow up signs of clinical improvement, LOT, de-escalation of antibiotics      Temp (24hrs), Avg:100.6 F (38.1 C), Min:99.4 F (37.4 C), Max:102.4 F (39.1 C)  Recent Labs  Lab 06/17/24 1727 06/17/24 1748 06/17/24 2133  WBC 9.8  --   --   CREATININE 0.64  --   --   LATICACIDVEN  --  2.8* 1.3    CrCl cannot be calculated (Unknown ideal weight.).    Allergies  Allergen Reactions   Heparin      Agitation    Antimicrobials this admission: Cefepime  11/21 >>  Vancomycin  11/21 >>   Microbiology results: 11/21 BCx:  11/22 MRSA PCR: ordered  Thank you for allowing pharmacy to be a part of this patient's care.  Lynwood Poplar, PharmD, BCPS Clinical Pharmacist 06/18/2024 2:53 AM

## 2024-06-18 NOTE — Progress Notes (Signed)
 PHARMACY - ANTICOAGULATION CONSULT NOTE  Pharmacy Consult for IV heparin   Indication: DVT treatment, stroke protocol  Allergies  Allergen Reactions   Heparin      Agitation    Patient Measurements: Height: 6' 1 (185.4 cm) Weight: (!) 149.7 kg (330 lb 0.5 oz) IBW/kg (Calculated) : 79.9 HEPARIN  DW (KG): 114.8  Vital Signs: Temp: 97.8 F (36.6 C) (11/22 0736) Temp Source: Oral (11/22 0736) BP: 125/62 (11/22 0830) Pulse Rate: 88 (11/22 0830)  Labs: Recent Labs    06/17/24 1727 06/18/24 0352  HGB 13.7 12.0*  HCT 39.9 35.3*  PLT 205 176  LABPROT 14.3  --   INR 1.1  --   CREATININE 0.64 0.68    Estimated Creatinine Clearance: 127.3 mL/min (by C-G formula based on SCr of 0.68 mg/dL).   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   Aortic root dilatation 08/27/2021   Aortic valve stenosis 10/14/2023   At risk for heart failure 01/29/2022   Bilateral carotid artery stenosis 08/12/2016   CAD (coronary artery disease) 01/11/2024   Candidal intertrigo 02/06/2022   Cellulitis of right leg 08/05/2021   Chronic diarrhea 05/26/2017   Chronic hypoxemic respiratory failure (HCC) 08/11/2023   Chronic obstructive lung disease (HCC) 08/27/2021   Chronic pain of both hips 12/29/2018   Chronic pain of both knees 07/11/2016   Chronic pain of both shoulders 12/29/2018   Chronic ulcer of right foot (HCC) 08/27/2021   Congestive heart failure (HCC) 07/06/2019   Continuous opioid dependence (HCC) 08/10/2023   Contracture of both Achilles tendons 02/01/2018   Essential hypertension 02/10/2012   Hardening of the aorta (main artery of the heart) 12/03/2021   Heart murmur 09/03/2023   History of pulmonary embolism 08/27/2021   Joint pain 08/27/2021   Microalbuminuria due to type 2 diabetes mellitus (HCC) 07/25/2022   Mixed hyperlipidemia 07/11/2016   Morbid  obesity (HCC) 07/06/2019   Obstructive lung disease (HCC) 07/06/2019   Obstructive sleep apnea (adult) (pediatric) 05/26/2017   Onychomycosis due to dermatophyte 09/28/2018   Osteoarthritis of right knee 12/24/2021   Other specified personal risk factors, not elsewhere classified 12/03/2021   Pre-ulcerative calluses 02/01/2018   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 On coumadin for 3 months.     Last Assessment & Plan:   Relevant Hx:  Course:  Daily Update:  Today's Plan:     Shortness of breath 02/10/2012   Smoking addiction 01/11/2024   Statin not tolerated 12/31/2021   Tobacco abuse 07/06/2019   Active tobacco use 2 packs/day x 34 years     Type 2 diabetes mellitus (HCC) 02/10/2012   A. With retinopathy and neuropathy  B. Long-term use of insulin      Type 2 diabetes mellitus with diabetic neuropathy, with long-term current use of insulin  (HCC) 07/11/2016   Ulcer of midfoot due to diabetes mellitus (HCC) 12/03/2021   Venous insufficiency of right leg 08/05/2021   Venous stasis 08/12/2016   Assessment: Darren Hunt is a 73 y.o. year old male admitted on 06/17/2024 with concern for sepsis and found to have. 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 Previously remote history treated with warfarin  for hx of DVT PE in 2023. Of note, [facility] 11/22 with evolving right MCA territory infarct with decreased edema, decreased multifocal hemorrhage and decreased leftward midline shift (now trace). Pharmacy consulted to dose heparin  (stroke protocol).  Goal of Therapy:  Heparin  level 0.3-0.5 units/ml Monitor platelets by anticoagulation protocol: Yes   Plan:  Heparin  infusion at 1400 units/hr (no bolus) 6h heparin  level  Daily heparin  level, CBC,  and monitoring for bleeding F/u plans for anticoagulation   Thank you for allowing pharmacy to participate in this patient's care.  Leonor GORMAN Bash, PharmD Emergency Medicine Clinical Pharmacist 06/18/2024,9:22 AM

## 2024-06-18 NOTE — Progress Notes (Signed)
   06/18/24 2230  BiPAP/CPAP/SIPAP  $ Non-Invasive Home Ventilator  Initial  $ Face Mask Large  Yes  BiPAP/CPAP/SIPAP Pt Type Adult  BiPAP/CPAP/SIPAP Resmed  Mask Type Nasal mask  Mask Size Large  Respiratory Rate 18 breaths/min  Flow Rate 3 lpm  Patient Home Machine No  Patient Home Mask No  Patient Home Tubing No  Auto Titrate Yes  Minimum cmH2O 5 cmH2O  Maximum cmH2O 15 cmH2O  Device Plugged into RED Power Outlet Yes

## 2024-06-18 NOTE — Progress Notes (Signed)
 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 DM TY 2 Fournier's gangrene 2019 CAD with CTO RCA with collaterals from left to right on cath 2013 at [facility] on aspirin  325 before all of his previous admissions Recent aortic stenosis diagnosis Last echo performed 05/09/2024 showed EF 55-60% degenerative mitral valve moderate aortic stenosis grade 1 diastolic dysfunction  Recent admission to [facility] via outside [facility] 9/28-10/10) as family wanted a second opinion and was brought here by private ambulance  [ischemic CVA cytotoxic edema with midline shift-antiplatelets therapy held-left popliteal DVT/PE noted as well] He also has a chronic indwelling Foley catheter after he developed left-sided sudden hemiplegia hemianopsia and was treated with IV thrombolytics developing hemorrhagic transformation and had an IVC filter placed during the hospital stay NIH was 10  He did have slightly more midline shift on 10/13 Pradaxa  was held repeat CT 10/14 showed stable changes and was told to follow-up in stroke clinic   On follow-up 11/12 saw Dr Rosemarie neurology in the office-was scheduled for CT head to see the progression or resolution of his midline shift/hemorrhage and is still not on anticoagulation--CT was never accomplished  11/21 return to hospital with weakness fever  and a cough since 11/20 sats in the 70s CBG 250--some element of postnasal drip taking Zyrtec intermittent subjective fevers-nurse found him cyanotic and was brought to the ED  According to report he had a Foley catheter exchanged recently and was being treated for E. coli positive  cultures growing ESBL per 11/4    Pertinent imaging/studies till date  11/21 CXR cardiomegaly pulmonary vascular congestion perihilar interstitial changes edema small left pleural effusion CT head without contrast evolving right MCA infarct with decreased edema decreased multifocal hemorrhage and decreased leftward midline shift now trace   Assessment  & Plan :    Sepsis on admission probably respiratory origin-lactic acid >2 patient febrile on arrival Recent treatment for ESBL E. Coli Urine analysis bland recent treatment in the outpatient setting for ESBL organism on 11/4 nitrites leukocytes negative after cath specimen Follow blood culture X2 suspect this is all related to pneumonia, continue cefepime  vancomycin   Follow RVP, follow sputum culture Bolus IV fluid given initially now keeping him dry Mild hypokalemia Replace with K. Dur 40-AM labs Acute superimposed on chronic HFpEF last echo EF 55-60% 05/09/2024 Not on home diuretics, not on GDMT-outpatient follow-up with his cardiologist Dr. Liborio who saw him in June of this year Given Lasix  on arrival and currently on Lasix  40 twice daily- X-ray tomorrow morning to evaluate the same Recent CVA not on anticoagulation because of midline shift as above follows with Dr. Rosemarie who we will involved in care Poststroke dysphagia Placed on dysphagia 3 diet, Zetia  10 atorvastatin  40 SLP to see for diet initiation/tolerance- Appreciative of Dr. Jerri seeing the patient and making decisions with regards to anticoagulation Chronic respiratory failure combination of heart failure OSA and COPD at baseline on 3 L oxygen at home Compliance with BiPAP should be enforced once he is stable Continue oxygen as able DVT PE 2023 DM TY 2 A1c 6.7 previously At home is on  metformin  500 twice daily-here we will give him moderate sliding scale coverage and watch his kidney function with diuresis-CBGs are ranging 192-202 no need for long-acting until we can see  how he does We may be able to resume metformin  in the next several days Acute urinary retention exchanged in ED Foley catheter was placed at nursing home they tried a voiding trial and then had to replace it Will need voiding trial/urology input as an outpatient Keep catheter for now and will need urology follow-up BMI 43   Data Reviewed today:  Sodium 134 potassium 3.2 BUN/creatinine 6/0.6 WBC 10.3 hemoglobin 12.0 platelet 176   DVT prophylaxis: SCD  Status is: Inpatient Inpatient pending resolution of pneumonia likely may need skilled therapy to see    Dispo/Global plan: See above   Discussed with wife [name] but did not get her on the phone-called her at [phone]  Time 60   Subjective:   A little sleepy but awakens can tell me most of the history-recalls being treated for urinary infection while still at nursing facility completed the treatment but Foley had to be replaced He is not really hungry he is kind of sleepy right now but able to follow commands He cannot tell me time date place person     Objective + exam Vitals:   06/18/24 0600 06/18/24 0602 06/18/24 0607 06/18/24 0608  BP: 128/67     Pulse: 89  92   Resp: (!) 32  (!) 28   Temp:    97.9 F (36.6 C)  TempSrc:    Oral  SpO2: 94%  93%   Weight:  (!) 149.7 kg    Height:  6' 1 (1.854 m)     Filed Weights   06/18/24 0200 06/18/24 0602  Weight: (!) 149.7 kg (!) 149.7 kg     Examination: Awake no distress pupils reactive Power diminished on left side and left arm left leg reflexes deferred tracks with smooth pursuit with eyes S1-S2 no murmur seems to be in sinus Abdomen obese nontender no rebound Foley in place Venous stasis lower extremities trace edema      Scheduled Meds:  amantadine   100 mg Oral Daily   atorvastatin   40 mg Oral Daily   DULoxetine   30 mg Oral Daily   ezetimibe   10 mg Oral Daily   furosemide   40 mg Intravenous BID   insulin  aspart  0-15 Units Subcutaneous TID WC    loratadine   10 mg Oral Daily   pantoprazole   40 mg Oral Daily   potassium chloride   40 mEq Oral Daily   Continuous Infusions:  ceFEPime  (MAXIPIME ) IV     vancomycin      acetaminophen  **OR** acetaminophen , bisacodyl , HYDROcodone -acetaminophen , ipratropium-albuterol , ondansetron  **OR** ondansetron  (ZOFRAN ) IV, senna-docusate  Colen Grimes, MD  [facility]

## 2024-06-18 NOTE — Progress Notes (Signed)
 PHARMACY - ANTICOAGULATION CONSULT NOTE  Pharmacy Consult for IV heparin   Indication: DVT treatment, stroke protocol  No Active Allergies   Patient Measurements: Height: 6' 1 (185.4 cm) Weight: (!) 149.7 kg (330 lb 0.5 oz) IBW/kg (Calculated) : 79.9 HEPARIN  DW (KG): 114.8  Vital Signs: Temp: 98.6 F (37 C) (11/22 1211) Temp Source: Oral (11/22 0736) BP: 118/61 (11/22 1211) Pulse Rate: 93 (11/22 1211)  Labs: Recent Labs    06/17/24 1727 06/18/24 0352 06/18/24 1607  HGB 13.7 12.0*  --   HCT 39.9 35.3*  --   PLT 205 176  --   LABPROT 14.3  --   --   INR 1.1  --   --   HEPARINUNFRC  --   --  0.13*  CREATININE 0.64 0.68  --     Estimated Creatinine Clearance: 127.3 mL/min (by C-G formula based on SCr of 0.68 mg/dL).   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   Aortic root dilatation 08/27/2021   Aortic valve stenosis 10/14/2023   At risk for heart failure 01/29/2022   Bilateral carotid artery stenosis 08/12/2016   CAD (coronary artery disease) 01/11/2024   Candidal intertrigo 02/06/2022   Cellulitis of right leg 08/05/2021   Chronic diarrhea 05/26/2017   Chronic hypoxemic respiratory failure (HCC) 08/11/2023   Chronic obstructive lung disease (HCC) 08/27/2021   Chronic pain of both hips 12/29/2018   Chronic pain of both knees 07/11/2016   Chronic pain of both shoulders 12/29/2018   Chronic ulcer of right foot (HCC) 08/27/2021   Congestive heart failure (HCC) 07/06/2019   Continuous opioid dependence (HCC) 08/10/2023   Contracture of both Achilles tendons 02/01/2018   Essential hypertension 02/10/2012   Hardening of the aorta (main artery of the heart) 12/03/2021   Heart murmur 09/03/2023   History of pulmonary embolism 08/27/2021   Joint pain 08/27/2021   Microalbuminuria due to type 2 diabetes mellitus (HCC) 07/25/2022   Mixed  hyperlipidemia 07/11/2016   Morbid obesity (HCC) 07/06/2019   Obstructive lung disease (HCC) 07/06/2019   Obstructive sleep apnea (adult) (pediatric) 05/26/2017   Onychomycosis due to dermatophyte 09/28/2018   Osteoarthritis of right knee 12/24/2021   Other specified personal risk factors, not elsewhere classified 12/03/2021   Pre-ulcerative calluses 02/01/2018   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 On coumadin for 3 months.     Last Assessment & Plan:   Relevant Hx:  Course:  Daily Update:  Today's Plan:     Shortness of breath 02/10/2012   Smoking addiction 01/11/2024   Statin not tolerated 12/31/2021   Tobacco abuse 07/06/2019   Active tobacco use 2 packs/day x 34 years     Type 2 diabetes mellitus (HCC) 02/10/2012   A. With retinopathy and neuropathy  B. Long-term use of insulin      Type 2 diabetes mellitus with diabetic neuropathy, with long-term current use of insulin  (HCC) 07/11/2016   Ulcer of midfoot due to diabetes mellitus (HCC) 12/03/2021   Venous insufficiency of right leg 08/05/2021   Venous stasis 08/12/2016   Assessment: Darren Hunt is a 73 y.o. year old male admitted on 06/17/2024 with concern for sepsis and found to have. Noted to have had large R MCA infarct  in October 2025 with hemorrhagic transformation and not on anticoagulation prior to presentation. Previously remote history treated with warfarin for hx of DVT PE in 2023. Of note, [facility] 11/22 with evolving right MCA territory infarct with decreased edema, decreased multifocal hemorrhage and decreased leftward midline shift (now trace). Pharmacy consulted to dose heparin  (stroke protocol).  PM: heparin  level 0.13 on 1400 units/hr. No issues with the infusion or bleeding reported per RN.  Goal of Therapy:  Heparin  level 0.3-0.5 units/ml Monitor platelets  by anticoagulation protocol: Yes   Plan:  Increase heparin  infusion to 1600 units/hr (no bolus) 8h heparin  level  Daily heparin  level, CBC, and monitoring for bleeding F/u plans for anticoagulation   Thank you for allowing pharmacy to participate in this patient's care.  Rocky Slade, PharmD, BCPS 06/18/2024,5:02 PM  Please check AMION for all [facility] Pharmacy phone numbers After 10:00 PM, call Main Pharmacy [phone]

## 2024-06-18 NOTE — Evaluation (Addendum)
 Occupational Therapy Evaluation Patient Details Name: Darren Hunt MRN: [id number] DOB: October 01, 1950 Today's Date: 06/18/2024   History of Present Illness   Pt is a 73 y.o. male admitted 11/21 with sepsis. 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 Pt from home with family. Pt currently needs up to max A for ADLs, min A for bed mobility and min-mod A for stand pivot transfer to chair on pt's L side. Pt needing mod A to control descent into chair. Pt with L deficits from prior CVA. SpO2 93% on 3L O2 at end of session. Pt presenting with impairments listed below, will follow acutely. Recommend HHOT at d/c.      If plan is discharge home, recommend the following:   Two people to help with walking and/or transfers;A lot of help with bathing/dressing/bathroom;Assistance with cooking/housework;Direct supervision/assist for medications management;Direct supervision/assist for financial management;Assist for transportation;Help with stairs or ramp for entrance     Functional Status Assessment   Patient has had a recent decline in their functional status and demonstrates the ability to make significant improvements in function in a reasonable and predictable amount of time.     Equipment Recommendations   None recommended by OT     Recommendations for Other Services   PT consult     Precautions/Restrictions   Precautions Precautions: Fall;Other (comment) Precaution/Restrictions Comments:  foley Restrictions Weight Bearing Restrictions Per Provider Order: No     Mobility Bed Mobility Overal bed mobility: Needs Assistance Bed Mobility: Supine to Sit     Supine to sit: Min assist          Transfers Overall transfer level: Needs assistance Equipment used: Rolling walker (2 wheels) Transfers: Sit to/from Stand, Bed to chair/wheelchair/BSC Sit to Stand: Min assist Stand pivot transfers: Mod assist         General transfer comment: mod A to assist descent in to chair, pt with LLE weakness      Balance Overall balance assessment: Needs assistance Sitting-balance support: Feet supported, No upper extremity supported Sitting balance-Leahy Scale: Good     Standing balance support: During functional activity, Reliant on assistive device for balance Standing balance-Leahy Scale: Poor Standing balance comment: heavy relaince on external support                           ADL either performed or assessed with clinical judgement   ADL Overall ADL's : Needs assistance/impaired Eating/Feeding: Set up;Sitting   Grooming: Minimal assistance   Upper Body Bathing: Moderate assistance   Lower Body Bathing: Maximal assistance   Upper Body Dressing : Moderate assistance   Lower Body Dressing: Maximal assistance   Toilet Transfer: Moderate assistance;Rolling walker (2 wheels);BSC/3in1   Toileting- Clothing Manipulation and Hygiene: Maximal assistance       Functional mobility during ADLs: Moderate assistance;Rolling walker (2 wheels)       Vision   Additional Comments: needed cues to locate chair on L     Perception Perception: Not tested  Praxis Praxis: Not tested       Pertinent Vitals/Pain Pain Assessment Pain Assessment: No/denies pain     Extremity/Trunk Assessment Upper Extremity Assessment Upper Extremity Assessment: Right hand dominant;LUE deficits/detail LUE Deficits / Details: h/o stroke with L residual weakness LUE  Sensation: decreased light touch;decreased proprioception LUE Coordination: decreased fine motor;decreased gross motor   Lower Extremity Assessment Lower Extremity Assessment: Defer to PT evaluation LLE Deficits / Details: h/o stroke with L residual weakness, grossly 2-3/5       Communication Communication Communication: No apparent difficulties   Cognition Arousal: Alert Behavior During Therapy: WFL for tasks assessed/performed                                 Following commands: Intact       Cueing  General Comments   Cueing Techniques: Verbal cues  Spo2 93% on 3L   Exercises     Shoulder Instructions      Home Living Family/patient expects to be discharged to:: Private residence Living Arrangements: Spouse/significant other;Children;Other relatives Available Help at Discharge: Family;Available 24 hours/day Type of Home: House Home Access: Ramped entrance     Home Layout: Multi-level;Able to live on main level with bedroom/bathroom;Full bath on main level     Bathroom Shower/Tub: Producer, Television/film/video: Handicapped height Bathroom Accessibility: Yes   Home Equipment: Cane - single point;Standard Environmental Consultant;Wheelchair - power;Grab bars - tub/shower;Shower seat - built in;Hand held shower head;Wheelchair - manual;Hospital bed;Other (comment) (hoyer lift, slide board)   Additional Comments: Stroke occurred while on vacation in [place] 04/24/24. Transferred to [facility] Oct 2025 then d/c'd to [facility] 05/10/24. Pt said he has been home approx 1 week. Active with HHPT.      Prior Functioning/Environment Prior Level of Function : Needs assist             Mobility Comments: Hoyer lift transfers into recliner vs w/c. Working on standing with HHPT. Self propels in w/c short distances. Sits EOB typically at home, last stand was 2 days ago ADLs Comments: wife helps with catheter care, assists with bathing at bed level, ind with grooming and self feeding  tasks, assist for med mgmt,    OT Problem List: Decreased strength;Decreased range of motion;Decreased activity tolerance;Impaired balance (sitting and/or standing);Decreased cognition;Decreased safety awareness;Cardiopulmonary status limiting activity;Impaired UE functional use;Impaired sensation;Impaired tone   OT Treatment/Interventions: Self-care/ADL training;Therapeutic exercise;DME and/or AE instruction;Energy conservation;Therapeutic activities;Balance training;Patient/family education      OT Goals(Current goals can be found in the care plan section)   Acute Rehab OT Goals Patient Stated Goal: none stated OT Goal Formulation: With patient Time For Goal Achievement: 07/02/24 Potential to Achieve Goals: Good ADL Goals Pt Will Transfer to Toilet: squat pivot transfer;stand pivot transfer;bedside commode;with contact guard assist Pt/caregiver will Perform Home Exercise Program: Increased ROM;Increased strength;Left upper extremity;With minimal assist;With written HEP provided Additional ADL Goal #1: pt will perform bed mobility with mod I in prep for seated ADLs   OT Frequency:  Min 2X/week    Co-evaluation              AM-PAC OT 6 Clicks Daily Activity     Outcome Measure Help from another person eating meals?: A Little Help from another person taking care of personal grooming?: A Little Help from another person toileting, which includes using toliet, bedpan, or urinal?: A Lot Help from another person bathing (including washing, rinsing, drying)?: A Lot  Help from another person to put on and taking off regular upper body clothing?: A Lot Help from another person to put on and taking off regular lower body clothing?: A Lot 6 Click Score: 14   End of Session Equipment Utilized During Treatment: Gait belt;Rolling walker (2 wheels);Oxygen Nurse Communication: Mobility status;stedy back to bed, black wound area on R foot, pt reports he is allergic to heparin   Activity  Tolerance: Patient tolerated treatment well Patient left: in chair;with call bell/phone within reach;with chair alarm set  OT Visit Diagnosis: Unsteadiness on feet (R26.81);Other abnormalities of gait and mobility (R26.89);Muscle weakness (generalized) (M62.81)                Time: 8543-8470 OT Time Calculation (min): 33 min Charges:  OT General Charges $OT Visit: 1 Visit OT Evaluation $OT Eval Moderate Complexity: 1 Mod OT Treatments $Self Care/Home Management : 8-22 mins  Edona Schreffler K, OTD, OTR/L SecureChat Preferred Acute Rehab [phone]   Laneta POUR Koonce 06/18/2024, 3:41 PM

## 2024-06-18 NOTE — Evaluation (Signed)
 Physical Therapy Evaluation Patient Details Name: Darren Hunt MRN: [id number] DOB: 29-Feb-1951 Today's Date: 06/18/2024  History of Present Illness  Pt is a 73 y.o. male admitted 11/21 with sepsis. 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 PTA pt living at home with family, active with HHPT. He recently d/c'd home from [facility] following his CVA 04/24/24. He required hoyer lift transfers and was able to self propel in w/c short household distances. Pt currently with functional limitations due to the deficits listed below (see PT Problem List). On eval, pt required mod assist bed mobility, and mod assist lateral scooting EOB toward R. Pt will benefit from acute skilled PT to increase their independence and safety with mobility to allow discharge. Post acute, recommend pt resume HHPT. No new DME needs.          If plan is discharge home, recommend the following: A lot of help with walking and/or transfers;A lot of help with bathing/dressing/bathroom;Help with stairs or ramp for entrance;Assistance with cooking/housework   Can travel by private vehicle        Equipment Recommendations None recommended by PT  Recommendations for Other Services       Functional Status Assessment Patient has had a recent decline in their functional status and demonstrates the ability to make significant improvements in function in a reasonable and predictable amount of time.     Precautions / Restrictions Precautions Precautions: Fall;Other (comment) Precaution/Restrictions Comments: foley      Mobility  Bed  Mobility Overal bed mobility: Needs Assistance Bed Mobility: Supine to Sit, Sit to Supine     Supine to sit: HOB elevated, Used rails, Mod assist Sit to supine: Mod assist, HOB elevated   General bed mobility comments: assist with trunk and LLE, increased time    Transfers Overall transfer level: Needs assistance                 General transfer comment: mod assist lateral scoots along EOB    Ambulation/Gait               General Gait Details: nonamb at baseline  Stairs            Wheelchair Mobility     Tilt Bed    Modified Rankin (Stroke Patients Only)       Balance Overall balance assessment: Needs assistance Sitting-balance support: Feet supported, No upper extremity supported Sitting balance-Leahy Scale: Good                                       Pertinent Vitals/Pain Pain Assessment Pain Assessment: No/denies pain    Home Living Family/patient expects to be discharged to:: Private residence Living Arrangements: Spouse/significant other;Children;Other relatives (Daughter, Son in social worker, grandchildren) Available Help at Discharge: Family;Available 24 hours/day Type of Home: House Home Access: Ramped entrance       Home Layout: Multi-level;Able to live on main level with bedroom/bathroom;Full bath on main level Home Equipment: Cane - single point;Standard Walker;Wheelchair - power;Grab bars - tub/shower;Shower seat - built in;Hand held  shower head;Wheelchair - manual;Hospital bed;Other (comment) (hoyer lift, sliding board) Additional Comments: Stroke occurred while on vacation in [place] 04/24/24. Transferred to [facility] Oct 2025 then d/c'd to [facility] 05/10/24. Pt said he has been home approx 1 week. Active with HHPT.    Prior Function Prior Level of Function : Needs assist             Mobility Comments: Hoyer lift transfers into recliner vs w/c. Working on standing with HHPT. Self propels in w/c short distances. ADLs  Comments: Indwelling foley catheter. Using bed pan for BM.     Extremity/Trunk Assessment   Upper Extremity Assessment Upper Extremity Assessment: LUE deficits/detail LUE Deficits / Details: h/o stroke with L residual weakness    Lower Extremity Assessment Lower Extremity Assessment: Generalized weakness;LLE deficits/detail LLE Deficits / Details: h/o stroke with L residual weakness, grossly 2-3/5       Communication   Communication Communication: No apparent difficulties    Cognition Arousal: Alert Behavior During Therapy: WFL for tasks assessed/performed   PT - Cognitive impairments: No family/caregiver present to determine baseline, Memory, Problem solving                         Following commands: Intact       Cueing Cueing Techniques: Verbal cues     General Comments General comments (skin integrity, edema, etc.): VSS on 3L. At baseline, pt only on nocturnal O2 via CPAP.    Exercises     Assessment/Plan    PT Assessment Patient needs continued PT services  PT Problem List Decreased strength;Cardiopulmonary status limiting activity;Decreased activity tolerance;Decreased balance;Decreased mobility       PT Treatment Interventions Therapeutic exercise;Balance training;Functional mobility training;Therapeutic activities;Patient/family education    PT Goals (Current goals can be found in the Care Plan section)  Acute Rehab PT Goals Patient Stated Goal: home PT Goal Formulation: With patient Time For Goal Achievement: 07/02/24 Potential to Achieve Goals: Good    Frequency Min 2X/week     Co-evaluation               AM-PAC PT 6 Clicks Mobility  Outcome Measure Help needed turning from your back to your side while in a flat bed without using bedrails?: A Lot Help needed moving from lying on your back to sitting on the side of a flat bed without using bedrails?: A Lot Help needed moving to and from a bed to a chair (including a  wheelchair)?: Total Help needed standing up from a chair using your arms (e.g., wheelchair or bedside chair)?: Total Help needed to walk in hospital room?: Total Help needed climbing 3-5 steps with a railing? : Total 6 Click Score: 8    End of Session Equipment Utilized During Treatment: Oxygen Activity Tolerance: Patient tolerated treatment well Patient left: in bed;with call bell/phone within reach;with bed alarm set Nurse Communication: Mobility status PT Visit Diagnosis: Other abnormalities of gait and mobility (R26.89);Muscle weakness (generalized) (M62.81)    Time: 8752-8694 PT Time Calculation (min) (ACUTE ONLY): 18 min   Charges:   PT Evaluation $PT Eval Moderate Complexity: 1 Mod   PT General Charges $$ ACUTE PT VISIT: 1 Visit         Sari MATSU., PT  Office # [phone]   Darren Hunt 06/18/2024, 1:30 PM

## 2024-06-19 ENCOUNTER — Inpatient Hospital Stay (HOSPITAL_COMMUNITY)

## 2024-06-19 DIAGNOSIS — A419 Sepsis, unspecified organism: Secondary | ICD-10-CM | POA: Diagnosis not present

## 2024-06-19 LAB — CBC WITH DIFFERENTIAL/PLATELET
Abs Immature Granulocytes: 0.02 K/uL (ref 0.00–0.07)
Basophils Absolute: 0 K/uL (ref 0.0–0.1)
Basophils Relative: 0 %
Eosinophils Absolute: 0.2 K/uL (ref 0.0–0.5)
Eosinophils Relative: 4 %
HCT: 35.2 % — ABNORMAL LOW (ref 39.0–52.0)
Hemoglobin: 12 g/dL — ABNORMAL LOW (ref 13.0–17.0)
Immature Granulocytes: 0 %
Lymphocytes Relative: 25 %
Lymphs Abs: 1.6 K/uL (ref 0.7–4.0)
MCH: 31.5 pg (ref 26.0–34.0)
MCHC: 34.1 g/dL (ref 30.0–36.0)
MCV: 92.4 fL (ref 80.0–100.0)
Monocytes Absolute: 0.7 K/uL (ref 0.1–1.0)
Monocytes Relative: 11 %
Neutro Abs: 3.7 K/uL (ref 1.7–7.7)
Neutrophils Relative %: 60 %
Platelets: 163 K/uL (ref 150–400)
RBC: 3.81 MIL/uL — ABNORMAL LOW (ref 4.22–5.81)
RDW: 15.3 % (ref 11.5–15.5)
WBC: 6.2 K/uL (ref 4.0–10.5)
nRBC: 0 % (ref 0.0–0.2)

## 2024-06-19 LAB — BASIC METABOLIC PANEL WITH GFR
Anion gap: 15 (ref 5–15)
BUN: 7 mg/dL — ABNORMAL LOW (ref 8–23)
CO2: 24 mmol/L (ref 22–32)
Calcium: 8 mg/dL — ABNORMAL LOW (ref 8.9–10.3)
Chloride: 101 mmol/L (ref 98–111)
Creatinine, Ser: 0.61 mg/dL (ref 0.61–1.24)
GFR, Estimated: >60 mL/min (ref >60)
Glucose, Bld: 142 mg/dL — ABNORMAL HIGH (ref 70–99)
Potassium: 3.1 mmol/L — ABNORMAL LOW (ref 3.5–5.1)
Sodium: 140 mmol/L (ref 135–145)

## 2024-06-19 LAB — HEPARIN LEVEL (UNFRACTIONATED)
Heparin Unfractionated: 0.17 [IU]/mL — ABNORMAL LOW (ref 0.30–0.70)
Heparin Unfractionated: 0.28 [IU]/mL — ABNORMAL LOW (ref 0.30–0.70)
Heparin Unfractionated: 0.37 [IU]/mL (ref 0.30–0.70)

## 2024-06-19 LAB — GLUCOSE, CAPILLARY
Glucose-Capillary: 173 mg/dL — ABNORMAL HIGH (ref 70–99)
Glucose-Capillary: 182 mg/dL — ABNORMAL HIGH (ref 70–99)
Glucose-Capillary: 144 mg/dL — ABNORMAL HIGH (ref 70–99)
Glucose-Capillary: 127 mg/dL — ABNORMAL HIGH (ref 70–99)
Glucose-Capillary: 132 mg/dL — ABNORMAL HIGH (ref 70–99)

## 2024-06-19 LAB — MAGNESIUM: Magnesium: 1.2 mg/dL — ABNORMAL LOW (ref 1.7–2.4)

## 2024-06-19 MED ORDER — MAGNESIUM SULFATE 2 GM/50ML IV SOLN
2.0000 g | Freq: Once | INTRAVENOUS | Status: AC
  Administered 2024-06-19: 2 g via INTRAVENOUS
  Filled 2024-06-19: qty 50

## 2024-06-19 MED ORDER — SODIUM CHLORIDE 0.9 % IV SOLN
1.0000 g | INTRAVENOUS | Status: DC
  Administered 2024-06-19: 1 g via INTRAVENOUS
  Filled 2024-06-19 (×2): qty 10

## 2024-06-19 MED ORDER — POTASSIUM CHLORIDE CRYS ER 20 MEQ PO TBCR
40.0000 meq | EXTENDED_RELEASE_TABLET | Freq: Two times a day (BID) | ORAL | Status: DC
  Administered 2024-06-19 (×2): 40 meq via ORAL
  Filled 2024-06-19: qty 2
  Filled 2024-06-19: qty 4

## 2024-06-19 MED ORDER — FLUTICASONE PROPIONATE 50 MCG/ACT NA SUSP
1.0000 | Freq: Every day | NASAL | Status: DC
  Administered 2024-06-19 – 2024-06-20 (×2): 1 via NASAL
  Filled 2024-06-19: qty 16

## 2024-06-19 NOTE — Progress Notes (Signed)
 PHARMACY - ANTICOAGULATION CONSULT NOTE  Pharmacy Consult for heparin  Indication: DVT in setting of recent stroke  Labs: Recent Labs    06/17/24 1727 06/18/24 0352 06/18/24 1607 06/19/24 0428  HGB 13.7 12.0*  --  12.0*  HCT 39.9 35.3*  --  35.2*  PLT 205 176  --  163  LABPROT 14.3  --   --   --   INR 1.1  --   --   --   HEPARINUNFRC  --   --  0.13* 0.17*  CREATININE 0.64 0.68  --   --    Assessment: 73yo male subtherapeutic on heparin  after rate change; no infusion issues or signs of bleeding per RN.  Goal of Therapy:  Heparin  level 0.3-0.5 units/ml   Plan:  Increase heparin  infusion by 2-3 units/kgABW/hr to 1900 units/hr. Check level in 6 hours.   Darren Hunt, PharmD, BCPS 06/19/2024 5:04 AM

## 2024-06-19 NOTE — Progress Notes (Signed)
 PHARMACY - ANTICOAGULATION CONSULT NOTE  Pharmacy Consult for IV heparin   Indication: DVT treatment, stroke protocol  No Active Allergies   Patient Measurements: Height: 6' 1 (185.4 cm) Weight: 135.2 kg (298 lb 1 oz) IBW/kg (Calculated) : 79.9 HEPARIN  DW (KG): 114.8  Vital Signs: Temp: 98.1 F (36.7 C) (11/23 2020) BP: 122/69 (11/23 2020) Pulse Rate: 84 (11/23 2020)  Labs: Recent Labs    06/17/24 1727 06/18/24 0352 06/18/24 1607 06/19/24 0428 06/19/24 1144 06/19/24 2141  HGB 13.7 12.0*  --  12.0*  --   --   HCT 39.9 35.3*  --  35.2*  --   --   PLT 205 176  --  163  --   --   LABPROT 14.3  --   --   --   --   --   INR 1.1  --   --   --   --   --   HEPARINUNFRC  --   --    < > 0.17* 0.28* 0.37  CREATININE 0.64 0.68  --  0.61  --   --    < > = values in this interval not displayed.    Estimated Creatinine Clearance: 120.4 mL/min (by C-G formula based on SCr of 0.61 mg/dL).   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   Aortic root dilatation 08/27/2021   Aortic valve stenosis 10/14/2023   At risk for heart failure 01/29/2022   Bilateral carotid artery stenosis 08/12/2016   CAD (coronary artery disease) 01/11/2024   Candidal intertrigo 02/06/2022   Cellulitis of right leg 08/05/2021   Chronic diarrhea 05/26/2017   Chronic hypoxemic respiratory failure (HCC) 08/11/2023   Chronic obstructive lung disease (HCC) 08/27/2021   Chronic pain of both hips 12/29/2018   Chronic pain of both knees 07/11/2016   Chronic pain of both shoulders 12/29/2018   Chronic ulcer of right foot (HCC) 08/27/2021   Congestive heart failure (HCC) 07/06/2019   Continuous opioid dependence (HCC) 08/10/2023   Contracture of both Achilles tendons 02/01/2018   Essential hypertension 02/10/2012   Hardening of the aorta (main artery of the heart) 12/03/2021   Heart murmur  09/03/2023   History of pulmonary embolism 08/27/2021   Joint pain 08/27/2021   Microalbuminuria due to type 2 diabetes mellitus (HCC) 07/25/2022   Mixed hyperlipidemia 07/11/2016   Morbid obesity (HCC) 07/06/2019   Obstructive lung disease (HCC) 07/06/2019   Obstructive sleep apnea (adult) (pediatric) 05/26/2017   Onychomycosis due to dermatophyte 09/28/2018   Osteoarthritis of right knee 12/24/2021   Other specified personal risk factors, not elsewhere classified 12/03/2021   Pre-ulcerative calluses 02/01/2018   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 On coumadin for 3 months.     Last Assessment & Plan:   Relevant Hx:  Course:  Daily Update:  Today's Plan:     Shortness of breath 02/10/2012   Smoking addiction 01/11/2024   Statin not tolerated 12/31/2021   Tobacco abuse 07/06/2019   Active tobacco use 2 packs/day x 34 years     Type 2 diabetes mellitus (HCC) 02/10/2012   A. With retinopathy and neuropathy  B. Long-term use of insulin      Type 2 diabetes mellitus with diabetic neuropathy, with  long-term current use of insulin  (HCC) 07/11/2016   Ulcer of midfoot due to diabetes mellitus (HCC) 12/03/2021   Venous insufficiency of right leg 08/05/2021   Venous stasis 08/12/2016   Assessment: Darren Hunt is a 73 y.o. year old male admitted on 06/17/2024 with concern for sepsis and found to have. 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 Previously remote history treated with warfarin for hx of DVT PE in 2023. Of note, [facility] 11/22 with evolving right MCA territory infarct with decreased edema, decreased multifocal hemorrhage and decreased leftward midline shift (now trace). Pharmacy consulted to dose heparin  (stroke protocol).  -heparin   level 0.37 and at goal on 2100 units/hr    Goal of Therapy:  Heparin  level 0.3-0.5 units/ml Monitor platelets by anticoagulation protocol: Yes   Plan:  -Continue heparin  2100 units/hr -Daily heparin  level and CBC  Prentice Poisson, PharmD Clinical Pharmacist **Pharmacist phone directory can now be found on [email or web address] ([email or web address]).  Listed under [facility].

## 2024-06-19 NOTE — Evaluation (Addendum)
 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   Aortic root dilatation 08/27/2021   Aortic valve stenosis 10/14/2023   At risk for heart failure 01/29/2022   Bilateral carotid artery stenosis 08/12/2016   CAD (coronary artery disease) 01/11/2024   Candidal intertrigo 02/06/2022   Cellulitis of right leg 08/05/2021   Chronic diarrhea 05/26/2017   Chronic hypoxemic respiratory failure (HCC) 08/11/2023   Chronic obstructive lung disease (HCC) 08/27/2021   Chronic pain of both hips 12/29/2018   Chronic pain of both knees 07/11/2016   Chronic pain of both shoulders 12/29/2018   Chronic ulcer of right foot (HCC) 08/27/2021   Congestive heart failure (HCC) 07/06/2019   Continuous opioid dependence (HCC) 08/10/2023   Contracture of both Achilles tendons 02/01/2018   Essential hypertension 02/10/2012   Hardening of the aorta (main artery of the heart) 12/03/2021   Heart murmur 09/03/2023   History of pulmonary embolism 08/27/2021   Joint pain 08/27/2021   Microalbuminuria due to type 2 diabetes mellitus (HCC) 07/25/2022   Mixed hyperlipidemia 07/11/2016   Morbid obesity (HCC) 07/06/2019   Obstructive lung disease (HCC) 07/06/2019   Obstructive sleep apnea (adult) (pediatric) 05/26/2017   Onychomycosis due to dermatophyte 09/28/2018   Osteoarthritis of right knee 12/24/2021   Other specified personal risk factors, not elsewhere classified 12/03/2021   Pre-ulcerative calluses 02/01/2018   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 On coumadin for 3 months.     Last Assessment & Plan:   Relevant Hx:  Course:  Daily Update:  Today's Plan:     Shortness of breath 02/10/2012   Smoking addiction 01/11/2024   Statin not tolerated 12/31/2021   Tobacco abuse 07/06/2019   Active tobacco use 2 packs/day x 34 years     Type 2 diabetes mellitus (HCC) 02/10/2012   A. With retinopathy and neuropathy  B. Long-term use of insulin      Type 2 diabetes mellitus with diabetic neuropathy, with long-term current use of insulin  (HCC) 07/11/2016   Ulcer of midfoot due to diabetes mellitus (HCC) 12/03/2021   Venous insufficiency of right leg 08/05/2021   Venous stasis 08/12/2016   Past Surgical History:  Past Surgical History:  Procedure Laterality Date   IVC FILTER INSERTION  05/04/2024   [facility], [address] [place], [place] [address]   HPI:  Pt is a 73 y.o. male admitted 11/21 with sepsis. Head CT evolving right MCA territory infarct with decreased edema, decreased multifocal hemorrhage and decreased leftward midline shift. CXR small left pleural effusion with persistent left basilar opacity, which may reflect atelectasis or residual infection. Being treated for pna. BSE 04/2024 mild oral dysphagia, Dys 3/thin recommended-wife reluctant to make changes to diet due to d/c soon. PMH:  CVA 03/2024 with residual left hemiparesis, CAD s/p PCI, T2DM, HLD, bilateral carotid artery stent, OSA on CPAP at home, tobacco use disorder, COPD, chronic nocturnal hypoxic respiratory failure on 2-3 L [place], HLD, hx of PE/DVT not on OAC  due to brain hemorrhages, urinary retention  s/p indwelling foley catheter, morbid obesity, HTN and HFpEF.    Assessment / Plan / Recommendation  Clinical Impression  Pt denies prior or current swallow impairments. Oral-motor without significant impairments and dentition mostly intact (missing several). Cough noted prior  to po's. There were no s/s aspiration with consistencies including including close to 3 oz water via straw consecutively. Oral prep, mastication was timely without residue. Suspect he may have adequate laryngeal protection however cannot determine via clinical assessment. Pt currently has pna, evolving infact (he denies decrease noted in function) and history of COPD, therefore recommend MBS to evaluate objectively for possible decreased airway protection without sensation.  Was on soft diet and SLP upgraded to regular, thin and pills with thin. MBS planned for tomorrow. out of precaution. SLP Visit Diagnosis: Dysphagia, unspecified (R13.10)    Aspiration Risk  Mild aspiration risk    Diet Recommendation Regular;Thin liquid    Liquid Administration via: Cup;Straw Medication Administration: Whole meds with liquid Supervision: Patient able to self feed Postural Changes: Seated upright at 90 degrees    Other  Recommendations Oral Care Recommendations: Oral care BID     Assistance Recommended at Discharge    Functional Status Assessment Patient has had a recent decline in their functional status and demonstrates the ability to make significant improvements in function in a reasonable and predictable amount of time.  Frequency and Duration min 2x/week  2 weeks       Prognosis Prognosis for improved oropharyngeal function: Good      Swallow Study   General Date of Onset: 06/19/24 HPI: Pt is a 73 y.o. male admitted 11/21 with sepsis. Head CT evolving right MCA territory infarct with decreased edema, decreased multifocal hemorrhage and decreased leftward midline shift. CXR small left pleural effusion with persistent left basilar opacity, which may reflect atelectasis or residual infection. Being treated for pna. BSE 04/2024 mild oral dysphagia, Dys 3/thin recommended-wife reluctant to make changes to diet due to d/c soon. PMH:  CVA 03/2024 with residual left hemiparesis, CAD s/p PCI, T2DM, HLD,  bilateral carotid artery stent, OSA on CPAP at home, tobacco use disorder, COPD, chronic nocturnal hypoxic respiratory failure on 2-3 L [place], HLD, hx of PE/DVT not on OAC due to brain hemorrhages, urinary retention  s/p indwelling foley catheter, morbid obesity, HTN and HFpEF. Type of Study: Bedside Swallow Evaluation Previous Swallow Assessment:  (see HPI) Diet Prior to this Study: Other (Comment);Thin liquids (Level 0) (soft) Temperature Spikes Noted: No Respiratory Status: Other (comment);Nasal cannula (on CPAP during day intermittently at home) History of Recent Intubation: No Behavior/Cognition: Alert;Cooperative;Pleasant mood Oral Cavity Assessment: Within Functional Limits Oral Care Completed by SLP: No Oral Cavity - Dentition:  (missing several- most intact) Vision: Functional for self-feeding Self-Feeding Abilities: Able to feed self Patient Positioning: Upright in bed Baseline Vocal Quality: Normal Volitional Cough: Strong Volitional Swallow: Able to elicit    Oral/Motor/Sensory Function Overall Oral Motor/Sensory Function: Within functional limits   Ice Chips Ice chips: Not tested   Thin Liquid Thin Liquid: Within functional limits Presentation: Cup;Straw    Nectar Thick Nectar Thick Liquid: Not tested   Honey Thick Honey Thick Liquid: Not tested   Puree Puree: Not tested   Solid     Solid: Within functional limits      Dustin Olam Bull 06/19/2024,11:12 AM

## 2024-06-19 NOTE — Progress Notes (Signed)
 Pt. Already wearing cpap. Tolerating well.

## 2024-06-19 NOTE — Progress Notes (Signed)
 Pharmacy Antibiotic Note  Darren Hunt is a 73 y.o. male admitted on 06/17/2024 with weakness/fever.  Pharmacy has been consulted for cefepime /vancomycin  dosing for sepsis due to PNA.   -WBC WNL, sCr 0.61 (~bl), Tmax 102.4, now 98.2 -Blood cultures collected; resp panel negative, MRSA PCR negative -CXR: small left pleural effusion w/ atelectasis.   D/W Dr. Royal, MRSA PCR negative making MRSA PNA unlikely. Ok to d/c Vancomycin .    Plan: -Continue Cefepime  2g IV every 8 hours -D/C Vancomycin   -Monitor renal function -Follow up signs of clinical improvement, LOT, de-escalation of antibiotics   Height: 6' 1 (185.4 cm) Weight: 135.2 kg (298 lb 1 oz) IBW/kg (Calculated) : 79.9  Temp (24hrs), Avg:98.4 F (36.9 C), Min:98.2 F (36.8 C), Max:98.6 F (37 C)  Recent Labs  Lab 06/17/24 1727 06/17/24 1748 06/17/24 2133 06/18/24 0352 06/19/24 0428  WBC 9.8  --   --  10.3 6.2  CREATININE 0.64  --   --  0.68 0.61  LATICACIDVEN  --  2.8* 1.3  --   --     Estimated Creatinine Clearance: 120.4 mL/min (by C-G formula based on SCr of 0.61 mg/dL).    No Active Allergies   Antimicrobials this admission: Cefepime  11/21 >>  Vancomycin  11/21 >> 11/23  Microbiology results: 11/21 BCx: NGTD 11/22 MRSA PCR: negative  Darren Hunt, PharmD, BCPS, FNKF Clinical Pharmacist [place] Please utilize Amion for appropriate phone number to reach the unit pharmacist [facility])  06/19/2024 10:36 AM

## 2024-06-19 NOTE — Progress Notes (Signed)
 PHARMACY - ANTICOAGULATION CONSULT NOTE  Pharmacy Consult for IV heparin   Indication: DVT treatment, stroke protocol  No Active Allergies   Patient Measurements: Height: 6' 1 (185.4 cm) Weight: 135.2 kg (298 lb 1 oz) IBW/kg (Calculated) : 79.9 HEPARIN  DW (KG): 114.8  Vital Signs: Temp: 98.2 F (36.8 C) (11/23 0411) Temp Source: Oral (11/23 0411) BP: 155/69 (11/23 0411) Pulse Rate: 85 (11/23 0411)  Labs: Recent Labs    06/17/24 1727 06/18/24 0352 06/18/24 1607 06/19/24 0428 06/19/24 1144  HGB 13.7 12.0*  --  12.0*  --   HCT 39.9 35.3*  --  35.2*  --   PLT 205 176  --  163  --   LABPROT 14.3  --   --   --   --   INR 1.1  --   --   --   --   HEPARINUNFRC  --   --  0.13* 0.17* 0.28*  CREATININE 0.64 0.68  --  0.61  --     Estimated Creatinine Clearance: 120.4 mL/min (by C-G formula based on SCr of 0.61 mg/dL).   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   Aortic root dilatation 08/27/2021   Aortic valve stenosis 10/14/2023   At risk for heart failure 01/29/2022   Bilateral carotid artery stenosis 08/12/2016   CAD (coronary artery disease) 01/11/2024   Candidal intertrigo 02/06/2022   Cellulitis of right leg 08/05/2021   Chronic diarrhea 05/26/2017   Chronic hypoxemic respiratory failure (HCC) 08/11/2023   Chronic obstructive lung disease (HCC) 08/27/2021   Chronic pain of both hips 12/29/2018   Chronic pain of both knees 07/11/2016   Chronic pain of both shoulders 12/29/2018   Chronic ulcer of right foot (HCC) 08/27/2021   Congestive heart failure (HCC) 07/06/2019   Continuous opioid dependence (HCC) 08/10/2023   Contracture of both Achilles tendons 02/01/2018   Essential hypertension 02/10/2012   Hardening of the aorta (main artery of the heart) 12/03/2021   Heart murmur 09/03/2023   History of pulmonary embolism 08/27/2021   Joint  pain 08/27/2021   Microalbuminuria due to type 2 diabetes mellitus (HCC) 07/25/2022   Mixed hyperlipidemia 07/11/2016   Morbid obesity (HCC) 07/06/2019   Obstructive lung disease (HCC) 07/06/2019   Obstructive sleep apnea (adult) (pediatric) 05/26/2017   Onychomycosis due to dermatophyte 09/28/2018   Osteoarthritis of right knee 12/24/2021   Other specified personal risk factors, not elsewhere classified 12/03/2021   Pre-ulcerative calluses 02/01/2018   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 On coumadin for 3 months.     Last Assessment & Plan:   Relevant Hx:  Course:  Daily Update:  Today's Plan:     Shortness of breath 02/10/2012   Smoking addiction 01/11/2024   Statin not tolerated 12/31/2021   Tobacco abuse 07/06/2019   Active tobacco use 2 packs/day x 34 years     Type 2 diabetes mellitus (HCC) 02/10/2012   A. With retinopathy and neuropathy  B. Long-term use of insulin      Type 2 diabetes mellitus with diabetic neuropathy, with long-term current use of insulin  (HCC) 07/11/2016   Ulcer of midfoot due to diabetes mellitus (HCC) 12/03/2021   Venous insufficiency of right leg 08/05/2021   Venous stasis  08/12/2016   Assessment: Darren Hunt is a 73 y.o. year old male admitted on 06/17/2024 with concern for sepsis and found to have. 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 Previously remote history treated with warfarin for hx of DVT PE in 2023. Of note, Darren Hunt 11/22 with evolving right MCA territory infarct with decreased edema, decreased multifocal hemorrhage and decreased leftward midline shift (now trace). Pharmacy consulted to dose heparin  (stroke protocol).  PM: heparin  level 0.28 on 1900 units/hr. No issues with the infusion or  bleeding reported per RN.  Goal of Therapy:  Heparin  level 0.3-0.5 units/ml Monitor platelets by anticoagulation protocol: Yes   Plan:  Increase heparin  infusion to 2100 units/hr (no bolus) 8h heparin  level  Daily heparin  level, CBC, and monitoring for bleeding F/u plans for anticoagulation   Darren Hunt, PharmD, BCPS, FNKF Clinical Pharmacist [name] Please utilize Amion for appropriate phone number to reach the unit pharmacist [facility])  06/19/2024,12:24 PM  Please check AMION for all [name] Pharmacy phone numbers After 10:00 PM, call Main Pharmacy [phone]

## 2024-06-19 NOTE — Plan of Care (Signed)
  Problem: Coping: Goal: Ability to adjust to condition or change in health will improve Outcome: Progressing   Problem: Skin Integrity: Goal: Risk for impaired skin integrity will decrease Outcome: Progressing   Problem: Education: Goal: Ability to verbalize understanding of medication therapies will improve Outcome: Progressing

## 2024-06-19 NOTE — Progress Notes (Signed)
   06/19/24 2007  BiPAP/CPAP/SIPAP  BiPAP/CPAP/SIPAP Pt Type Adult  BiPAP/CPAP/SIPAP Resmed  Mask Type Nasal mask  IPAP 15 cmH20  EPAP 5 cmH2O  Auto Titrate Yes  Minimum cmH2O 5 cmH2O  Maximum cmH2O 15 cmH2O

## 2024-06-19 NOTE — Progress Notes (Addendum)
 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 DM TY 2 Fournier's gangrene 2019 CAD with CTO RCA with collaterals from left to right on cath 2013 at [facility] on aspirin  325 before all of his previous admissions Recent aortic stenosis diagnosis Last echo performed 05/09/2024 showed EF 55-60% degenerative mitral valve moderate aortic stenosis grade 1 diastolic dysfunction  Recent admission to [facility] via outside [facility] 9/28-10/10) as family wanted a second opinion and was brought here by private ambulance  [ischemic CVA cytotoxic edema with midline shift-antiplatelets therapy held-left popliteal DVT/PE noted as well] He also has a chronic indwelling Foley catheter after he developed left-sided sudden hemiplegia hemianopsia and was treated with IV thrombolytics developing hemorrhagic transformation and had an IVC filter placed during the hospital stay NIH was 10  He did have slightly more midline shift on 10/13 Pradaxa  was held repeat CT 10/14 showed stable changes and was told to follow-up in stroke clinic   On follow-up 11/12 saw Dr Rosemarie neurology in the office-was scheduled for CT head to see the progression or resolution of his midline shift/hemorrhage and is still not on anticoagulation--CT was never accomplished  11/21 return to hospital with weakness fever  and a cough since 11/20 sats in the 70s CBG 250--some element of postnasal drip taking Zyrtec intermittent subjective fevers-nurse found him cyanotic and was brought to the ED  According to report he had a Foley catheter exchanged recently and was being treated for E. coli positive  cultures growing ESBL per 11/4    Pertinent imaging/studies till date  11/21 CXR cardiomegaly pulmonary vascular congestion perihilar interstitial changes edema small left pleural effusion CT head without contrast evolving right MCA infarct with decreased edema decreased multifocal hemorrhage and decreased leftward midline shift now trace 11/23 CXR repeated = resolution of mild pulmonary edema, small left pleural effusion persistent left basilar opacity?  Infection   Assessment  & Plan :    Sepsis on admission probably respiratory origin-lactic acid >2 patient febrile on arrival Recent treatment for ESBL E. Coli Urine analysis bland recent treatment in the outpatient setting for ESBL organism on 11/4--- not acutely infected from this perspective- UA negative BCx 2 pending-vancomycin /cefepime  narrowed to ceftriaxone  1 g every 24  and likely transition to oral antibiotics in 24 hours if remains stable Mild hypokalemia Replace with K. Dur 40-AM labs, replace mag with 2 grams today---check labs in the morning Acute superimposed on chronic HFpEF last echo EF 55-60% 05/09/2024 Not on home diuretics, not on GDMT-outpatient follow-up with his cardiologist Dr. Liborio who saw him in June of this year Given Lasix  on arrival and currently on Lasix  40 twice daily--- I's/O = -2.5 L weight down from 149-135 and probably inaccurate needs standing weight Recent CVA not on anticoagulation because of midline shift as above follows with Dr. Rosemarie recently in the outpatient setting Poststroke dysphagia Appreciative SLP-graduated dysphagia 3--> to regular diet continues zetia  10 atorvastatin  40 Amantadine  was discontinued per Dr. Xu  X-ray as above shows possible persistent basilar opacity continue antibiotics as above Mild hypokalemia Potassium increased to 40 twice daily Replace magnesium  with 2  g Chronic respiratory failure combination of heart failure OSA and COPD at baseline on 3 L oxygen at  home Compliance with BiPAP should be enforced once he is stable Continue oxygen as able DVT PE 2023 Long discussion with Dr. Jerri 11/22 cautious heparin  initiation given recent intracerebral hemorrhage and will transition to DOAC after 48 hours-I have discussed this thoroughly with her family DM TY 2 A1c 6.7 previously At home is on metformin  500 twice daily-CBG 142-172 on moderate sliding scale Likely resume oral meds in the outpatient setting Acute urinary retention exchanged in ED-was placed in nursing home and was unable to void there Will need voiding trial/urology input as an outpatient Keep catheter for now and will need urology follow-up BMI 59   Called daughter Arland [phone]  Data Reviewed today:   Sodium 140 potassium 3.1 chloride 101 bicarb 24 BUN/creatinine 7/0.6 WBC 6.2 hemoglobin 12.0  DVT prophylaxis: SCD  Status is: Inpatient Inpatient pending resolution of pneumonia likely may need skilled therapy to see-PT eval recommends potential therapy/skilled placement    Dispo/Global plan: See above   Discussed with family on 11/22  Time 60   Subjective:   Coherent awake overall looks a little better but still breathing a little hard Feels stopped up so given flonase  Coughing Sputum ++ No fever no chills no nausea currently  Objective + exam Vitals:   06/18/24 2230 06/19/24 0411 06/19/24 0411 06/19/24 0500  BP:  (!) 155/69 (!) 155/69   Pulse:  85 85   Resp: (!) 22  20   Temp:  98.2 F (36.8 C)    TempSrc:  Oral    SpO2:  92% 92%   Weight:    135.2 kg  Height:       Filed Weights   06/18/24 0200 06/18/24 0602 06/19/24 0500  Weight: (!) 149.7 kg (!) 149.7 kg 135.2 kg     Examination: Awake no distress pupils reactive Power diminished slight on left side-reflexes deferred Decreased Ae post with a little bit of wheezing S1-S2 no murmur seems to be in sinus with PVCs Abdomen obese nontender no rebound Foley in place Venous stasis lower extremities  trace edema   Scheduled Meds:  amantadine   50 mg Oral Daily   atorvastatin   40 mg Oral Daily   Chlorhexidine  Gluconate Cloth  6 each Topical Daily   DULoxetine   30 mg Oral Daily   ezetimibe   10 mg Oral Daily   fluticasone   1 spray Each Nare Daily   furosemide   40 mg Intravenous BID   insulin  aspart  0-15 Units Subcutaneous TID WC   loratadine   10 mg Oral Daily   pantoprazole   40 mg Oral Daily   potassium chloride   40 mEq Oral BID   Continuous Infusions:  cefTRIAXone  (ROCEPHIN )  IV 1 g (06/19/24 1309)   heparin  2,100 Units/hr (06/19/24 1254)   magnesium  sulfate bolus IVPB 2 g (06/19/24 1304)   acetaminophen  **OR** acetaminophen , bisacodyl , HYDROcodone -acetaminophen , ipratropium-albuterol , ondansetron  **OR** ondansetron  (ZOFRAN ) IV, senna-docusate  Darren Grimes, MD  [facility]

## 2024-06-20 ENCOUNTER — Other Ambulatory Visit (HOSPITAL_COMMUNITY): Payer: Self-pay

## 2024-06-20 ENCOUNTER — Inpatient Hospital Stay (HOSPITAL_COMMUNITY)

## 2024-06-20 ENCOUNTER — Telehealth: Payer: Self-pay | Admitting: Neurology

## 2024-06-20 ENCOUNTER — Telehealth (HOSPITAL_COMMUNITY): Payer: Self-pay | Admitting: Pharmacy Technician

## 2024-06-20 DIAGNOSIS — A419 Sepsis, unspecified organism: Secondary | ICD-10-CM | POA: Diagnosis not present

## 2024-06-20 LAB — RESPIRATORY PANEL BY PCR

## 2024-06-20 LAB — CBC WITH DIFFERENTIAL/PLATELET
Abs Immature Granulocytes: 0.02 K/uL (ref 0.00–0.07)
Basophils Absolute: 0 K/uL (ref 0.0–0.1)
Basophils Relative: 0 %
Eosinophils Absolute: 0.2 K/uL (ref 0.0–0.5)
Eosinophils Relative: 3 %
HCT: 39.4 % (ref 39.0–52.0)
Hemoglobin: 13.5 g/dL (ref 13.0–17.0)
Immature Granulocytes: 0 %
Lymphocytes Relative: 28 %
Lymphs Abs: 1.9 K/uL (ref 0.7–4.0)
MCH: 31.2 pg (ref 26.0–34.0)
MCHC: 34.3 g/dL (ref 30.0–36.0)
MCV: 91 fL (ref 80.0–100.0)
Monocytes Absolute: 0.6 K/uL (ref 0.1–1.0)
Monocytes Relative: 10 %
Neutro Abs: 3.9 K/uL (ref 1.7–7.7)
Neutrophils Relative %: 59 %
Platelets: 202 K/uL (ref 150–400)
RBC: 4.33 MIL/uL (ref 4.22–5.81)
RDW: 15 % (ref 11.5–15.5)
WBC: 6.8 K/uL (ref 4.0–10.5)
nRBC: 0 % (ref 0.0–0.2)

## 2024-06-20 LAB — COMPREHENSIVE METABOLIC PANEL WITH GFR
ALT: 15 U/L (ref 0–44)
AST: 21 U/L (ref 15–41)
Albumin: 2.7 g/dL — ABNORMAL LOW (ref 3.5–5.0)
Alkaline Phosphatase: 52 U/L (ref 38–126)
Anion gap: 13 (ref 5–15)
BUN: 7 mg/dL — ABNORMAL LOW (ref 8–23)
CO2: 27 mmol/L (ref 22–32)
Calcium: 8.4 mg/dL — ABNORMAL LOW (ref 8.9–10.3)
Chloride: 98 mmol/L (ref 98–111)
Creatinine, Ser: 0.6 mg/dL — ABNORMAL LOW (ref 0.61–1.24)
GFR, Estimated: >60 mL/min (ref >60)
Glucose, Bld: 139 mg/dL — ABNORMAL HIGH (ref 70–99)
Potassium: 3.1 mmol/L — ABNORMAL LOW (ref 3.5–5.1)
Sodium: 138 mmol/L (ref 135–145)
Total Bilirubin: 1.6 mg/dL — ABNORMAL HIGH (ref 0.0–1.2)
Total Protein: 6.2 g/dL — ABNORMAL LOW (ref 6.5–8.1)

## 2024-06-20 LAB — MAGNESIUM: Magnesium: 1.5 mg/dL — ABNORMAL LOW (ref 1.7–2.4)

## 2024-06-20 LAB — GLUCOSE, CAPILLARY
Glucose-Capillary: 168 mg/dL — ABNORMAL HIGH (ref 70–99)
Glucose-Capillary: 170 mg/dL — ABNORMAL HIGH (ref 70–99)
Glucose-Capillary: 199 mg/dL — ABNORMAL HIGH (ref 70–99)

## 2024-06-20 LAB — HEPARIN LEVEL (UNFRACTIONATED): Heparin Unfractionated: 0.41 [IU]/mL (ref 0.30–0.70)

## 2024-06-20 MED ORDER — METFORMIN HCL ER 500 MG PO TB24
500.0000 mg | ORAL_TABLET | Freq: Two times a day (BID) | ORAL | Status: DC
  Administered 2024-06-20 – 2024-06-22 (×4): 500 mg via ORAL
  Filled 2024-06-20 (×4): qty 1

## 2024-06-20 MED ORDER — UMECLIDINIUM BROMIDE 62.5 MCG/ACT IN AEPB
1.0000 | INHALATION_SPRAY | Freq: Every day | RESPIRATORY_TRACT | Status: DC
  Administered 2024-06-21 – 2024-06-22 (×2): 1 via RESPIRATORY_TRACT
  Filled 2024-06-20: qty 7

## 2024-06-20 MED ORDER — MAGNESIUM SULFATE 2 GM/50ML IV SOLN
2.0000 g | Freq: Once | INTRAVENOUS | Status: AC
  Administered 2024-06-20: 2 g via INTRAVENOUS
  Filled 2024-06-20: qty 50

## 2024-06-20 MED ORDER — POTASSIUM CHLORIDE CRYS ER 20 MEQ PO TBCR
40.0000 meq | EXTENDED_RELEASE_TABLET | Freq: Three times a day (TID) | ORAL | Status: AC
  Administered 2024-06-20 (×3): 40 meq via ORAL
  Filled 2024-06-20 (×3): qty 2

## 2024-06-20 MED ORDER — LEVOFLOXACIN 500 MG PO TABS
500.0000 mg | ORAL_TABLET | Freq: Every day | ORAL | Status: DC
  Administered 2024-06-20 – 2024-06-21 (×2): 500 mg via ORAL
  Filled 2024-06-20 (×2): qty 1

## 2024-06-20 MED ORDER — FUROSEMIDE 10 MG/ML IJ SOLN
80.0000 mg | Freq: Two times a day (BID) | INTRAMUSCULAR | Status: DC
  Administered 2024-06-20 – 2024-06-22 (×4): 80 mg via INTRAVENOUS
  Filled 2024-06-20 (×4): qty 8

## 2024-06-20 MED ORDER — MUSCLE RUB 10-15 % EX CREA
TOPICAL_CREAM | CUTANEOUS | Status: DC | PRN
Start: 2024-06-20 — End: 2024-06-22

## 2024-06-20 MED ORDER — APIXABAN 5 MG PO TABS
5.0000 mg | ORAL_TABLET | Freq: Two times a day (BID) | ORAL | Status: DC
Start: 2024-06-20 — End: 2024-06-22
  Administered 2024-06-20 – 2024-06-22 (×4): 5 mg via ORAL
  Filled 2024-06-20 (×4): qty 1

## 2024-06-20 NOTE — Progress Notes (Signed)
 Heart Failure Navigator Progress Note  Assessed for Heart & Vascular TOC clinic readiness.  Patient does not meet criteria due to EF 55-60%, per MD patient is bed Bound, No HF TOC. .   Navigator will sign off at this time.   Stephane Haddock, BSN, Scientist, Clinical (histocompatibility And Immunogenetics) Only

## 2024-06-20 NOTE — Procedures (Signed)
 Modified Barium Swallow Study  Patient Details  Name: Darren Hunt MRN: [id number] Date of Birth: [birth year]  Today's Date: 06/20/2024  Modified Barium Swallow completed.  Full report located under Chart Review in the Imaging Section.  History of Present Illness Darren Hunt is a 73 y.o. male admitted 11/21 with sepsis. Head CT evolving right MCA territory infarct with decreased edema, decreased multifocal hemorrhage and decreased leftward midline shift. CXR small left pleural effusion with persistent left basilar opacity, which may reflect atelectasis or residual infection. Being treated for pna. BSE 04/2024 mild oral dysphagia, Dys 3/thin recommended-wife reluctant to make changes to diet due to d/c soon. MBSS completed on 06/20/24 observed to be grossly WNL. 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   Clinical Impression  Pt presents with a grossly functional oropharyngeal swallow per MBSS completed today. There was occasional instances of transient, shallow penetration of thin liquids during the swallow, primarily during sequential straw sip trials. No aspiration observed across trials.   Oral phase significant for piecemeal swallow of pudding and regular solid, though appeared to clear bolus effectively. There were occasional instances of posterior loss of liquids to the pyriform sinuses before the swallow, which is considered WNL for pt's age. Pt exhibited difficulty with oral transit of barium tablet and tablet was eventually expectorated. He denied concerns with swallowing his current medications with liquids and did verbalize that the barium tablet was large.   Pharyngeal swallow  significant for reduced laryngeal vestibule closure during the swallow, which contributed to intermittent penetration events. Otherwise, pharyngeal swallow appeared WNL.   Recommend continue current regular diet and thin liquids as tolerated. PO meds as tolerated.  Plan: SLP will follow up to assess diet tolerance and sign off if there are no concerns for aspiration with PO intake.   Factors that may increase risk of adverse event in presence of aspiration Noe & Lianne 2021): Frail or deconditioned  Swallow Evaluation Recommendations Recommendations: PO diet PO Diet Recommendation: Regular;Thin liquids (Level 0) Liquid Administration via: Cup;Straw Medication Administration:  (as tolerated) Supervision: Patient able to self-feed Swallowing strategies  : Slow rate;Small bites/sips Postural changes: Position pt fully upright for meals Oral care recommendations: Oral care BID (2x/day)      Kathia Covington J Josphine Laffey 06/20/2024,4:04 PM

## 2024-06-20 NOTE — Progress Notes (Signed)
 PHARMACY - ANTICOAGULATION CONSULT NOTE  Pharmacy Consult for IV heparin   Indication: DVT treatment, stroke protocol  No Active Allergies   Patient Measurements: Height: 6' 1 (185.4 cm) Weight: 130.2 kg (287 lb 0.6 oz) IBW/kg (Calculated) : 79.9 HEPARIN  DW (KG): 114.8  Vital Signs: Temp: 98.4 F (36.9 C) (11/24 0609) Temp Source: Oral (11/24 0609) BP: 160/65 (11/24 0609) Pulse Rate: 74 (11/24 0609)  Labs: Recent Labs    06/17/24 1727 06/18/24 0352 06/18/24 1607 06/19/24 0428 06/19/24 1144 06/19/24 2141 06/20/24 0422  HGB 13.7 12.0*  --  12.0*  --   --  13.5  HCT 39.9 35.3*  --  35.2*  --   --  39.4  PLT 205 176  --  163  --   --  202  LABPROT 14.3  --   --   --   --   --   --   INR 1.1  --   --   --   --   --   --   HEPARINUNFRC  --   --    < > 0.17* 0.28* 0.37 0.41  CREATININE 0.64 0.68  --  0.61  --   --  0.60*   < > = values in this interval not displayed.    Estimated Creatinine Clearance: 118.1 mL/min (A) (by C-G formula based on SCr of 0.6 mg/dL (L)).   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   Aortic root dilatation 08/27/2021   Aortic valve stenosis 10/14/2023   At risk for heart failure 01/29/2022   Bilateral carotid artery stenosis 08/12/2016   CAD (coronary artery disease) 01/11/2024   Candidal intertrigo 02/06/2022   Cellulitis of right leg 08/05/2021   Chronic diarrhea 05/26/2017   Chronic hypoxemic respiratory failure (HCC) 08/11/2023   Chronic obstructive lung disease (HCC) 08/27/2021   Chronic pain of both hips 12/29/2018   Chronic pain of both knees 07/11/2016   Chronic pain of both shoulders 12/29/2018   Chronic ulcer of right foot (HCC) 08/27/2021   Congestive heart failure (HCC) 07/06/2019   Continuous opioid dependence (HCC) 08/10/2023   Contracture of both Achilles tendons 02/01/2018   Essential hypertension  02/10/2012   Hardening of the aorta (main artery of the heart) 12/03/2021   Heart murmur 09/03/2023   History of pulmonary embolism 08/27/2021   Joint pain 08/27/2021   Microalbuminuria due to type 2 diabetes mellitus (HCC) 07/25/2022   Mixed hyperlipidemia 07/11/2016   Morbid obesity (HCC) 07/06/2019   Obstructive lung disease (HCC) 07/06/2019   Obstructive sleep apnea (adult) (pediatric) 05/26/2017   Onychomycosis due to dermatophyte 09/28/2018   Osteoarthritis of right knee 12/24/2021   Other specified personal risk factors, not elsewhere classified 12/03/2021   Pre-ulcerative calluses 02/01/2018   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 On coumadin for 3 months.     Last Assessment & Plan:   Relevant Hx:  Course:  Daily Update:  Today's Plan:     Shortness of breath 02/10/2012   Smoking addiction 01/11/2024   Statin not tolerated 12/31/2021   Tobacco abuse 07/06/2019   Active tobacco use 2 packs/day x 34 years     Type 2 diabetes mellitus (HCC) 02/10/2012   A. With retinopathy  and neuropathy  B. Long-term use of insulin      Type 2 diabetes mellitus with diabetic neuropathy, with long-term current use of insulin  (HCC) 07/11/2016   Ulcer of midfoot due to diabetes mellitus (HCC) 12/03/2021   Venous insufficiency of right leg 08/05/2021   Venous stasis 08/12/2016   Assessment: Darren Hunt is a 73 y.o. year old male admitted on 06/17/2024 with concern for sepsis and found to have. 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 Previously remote history treated with warfarin for hx of DVT PE in 2023. Of note, [facility] 11/22 with evolving right MCA territory infarct with decreased edema, decreased multifocal hemorrhage and decreased leftward  midline shift (now trace). Pharmacy consulted to dose heparin  (stroke protocol).  11/24 AM: Hl at goal 0.41 with heparin  running at 2100 units/hour. CBC WNL.   Goal of Therapy:  Heparin  level 0.3-0.5 units/ml Monitor platelets by anticoagulation protocol: Yes   Plan:  Continue heparin  infusion at 2100 units/hr (no bolus) Daily heparin  level, CBC, and monitoring for bleeding F/u plans for anticoagulation   Massie Fila, PharmD Clinical Pharmacist  06/20/2024 7:43 AM

## 2024-06-20 NOTE — Plan of Care (Signed)
  Problem: Coping: Goal: Ability to adjust to condition or change in health will improve Outcome: Progressing   Problem: Nutritional: Goal: Maintenance of adequate nutrition will improve Outcome: Progressing   Problem: Skin Integrity: Goal: Risk for impaired skin integrity will decrease Outcome: Progressing   Problem: Activity: Goal: Capacity to carry out activities will improve Outcome: Progressing

## 2024-06-20 NOTE — TOC CM/SW Note (Signed)
 Transition of Care [facility]) - Inpatient Brief Assessment   Patient Details  Name: Darren Hunt MRN: [id number] Date of Birth: 1950/11/28  Transition of Care [facility]) CM/SW Contact:    Tom-Johnson, Harvest Muskrat, RN Phone Number: 06/20/2024, 3:36 PM   Clinical Narrative:  Patient presented to the ED with Generalized Weakness, Cough, fever. CXR showed mild Pulmonary Edema, small Lt Pleural Effusion. CT Head showed evolving Rt MCA Infarct. Patient with hx of recent CVA 04/2024 complicated by post-TNK hemorrhage with residual Lt Hemiparesis, CAD s/p PCI, T2DM, HLD, bilateral Carotid Artery Stent, OSA on CPAP, COPD, chronic Nocturnal Hypoxic Respiratory Failure on 2-3 L [place] from [employer], HLD, PE/DVT not on OAC due to brain hemorrhages, Urinary Retention with chronic Indwelling Foley Catheter, morbid obesity, HTN and HFpEF. Neurology following. Continues on IV abx, Heparin  gtt.   From home with wife, [name]. Has two supportive children. Daughter, Arland and her family stays with patient. Patient is currently bedbound at home. Has all necessary DME's at home including electric wheelchairs, ramp, hospital bed, hoyer lift and handicapped van.    PCP is Chrisco, Salomon, FNP and uses [facility] on [address] in [place].   Home health recommended, patient is currently active with [facility]. Resumption of care referral sent through the HUB with acceptance noted, info on AVS.  Patient not Medically ready for discharge.  CM will continue to follow as patient progresses with care towards discharge.        Transition of Care Asessment: Insurance and Status: Insurance coverage has been reviewed Patient has primary care physician: Yes Home environment has been reviewed: Yes Prior level of function:: Modified Independent Prior/Current Home Services: Current home services Recruitment Consultant) Social Drivers of Health Review: SDOH reviewed no interventions necessary Readmission risk has been reviewed: Yes Transition  of care needs: transition of care needs identified, TOC will continue to follow

## 2024-06-20 NOTE — Plan of Care (Signed)
  Problem: SLP Dysphagia Goals Goal: Misc Dysphagia Goal Flowsheets (Taken 06/20/2024 1548) Misc Dysphagia Goal: Pt will tolerate least restrictive diet with no overt s/s of aspiration or decline in pulmonary status.

## 2024-06-20 NOTE — Telephone Encounter (Signed)
 Pt wife called stating that Pt in Hospital and  just got a Ct-scan done the patient wife wants to know since Pt already had appointment to get Ct scan  with Neurologist .  Wife wants to  know can Neurologist use the hospital SCAN instead .

## 2024-06-20 NOTE — Progress Notes (Signed)
 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 DM TY 2 Fournier's gangrene 2019 CAD with CTO RCA with collaterals from left to right on cath 2013 at [facility] on aspirin  325 before all of his previous admissions Recent aortic stenosis diagnosis Last echo performed 05/09/2024 showed EF 55-60% degenerative mitral valve moderate aortic stenosis grade 1 diastolic dysfunction  Recent admission to [facility] via outside [facility] 9/28-10/10) as family wanted a second opinion and was brought here by private ambulance  [ischemic CVA cytotoxic edema with midline shift-antiplatelets therapy held-left popliteal DVT/PE noted as well] He also has a chronic indwelling Foley catheter after he developed left-sided sudden hemiplegia hemianopsia and was treated with IV thrombolytics developing hemorrhagic transformation and had an IVC filter placed during the hospital stay NIH was 10  He did have slightly more midline shift on 10/13 Pradaxa  was held repeat CT 10/14 showed stable changes and was told to follow-up in stroke clinic   On follow-up 11/12 saw Dr Rosemarie neurology in the office-was scheduled for CT head to see the progression or resolution of his midline shift/hemorrhage and is still not on anticoagulation--CT was never accomplished  11/21 return to hospital with weakness fever  and a cough since 11/20 sats in the 70s CBG 250--some element of postnasal drip taking Zyrtec intermittent subjective fevers-nurse found him cyanotic and was brought to the ED  According to report he had a Foley catheter exchanged recently and was being treated for E. coli positive  cultures growing ESBL per 11/4    Pertinent imaging/studies till date  11/21 CXR cardiomegaly pulmonary vascular congestion perihilar interstitial changes edema small left pleural effusion CT head without contrast evolving right MCA infarct with decreased edema decreased multifocal hemorrhage and decreased leftward midline shift now trace 11/23 CXR repeated = resolution of mild pulmonary edema, small left pleural effusion persistent left basilar opacity?  Infection   Assessment  & Plan :    Sepsis on admission secondary to pneumonia Recent treated ESBL 11/4 UA this admission negative-respiratory pathogen panel still pending-BCx 2 NGTD-COVID-negative Vancomycin  and cefepime  narrowed to ceftriaxone  and now will switch to Levaquin  p.o. 500 daily  Acute superimposed on chronic respiratory failure--- is on 3 L of oxygen at home has COPD as well Nasal stuffiness as well as sinus issues Claritin  reordered humidify oxygen continue Flonase  1 spray daily He says that Vicks vapor rub/menthol works for him so we will order at his request OSA COPD Ensure compliance on BiPAP he has nasal PAP that he uses Continuing DuoNeb 3 mL every 6 as needed--was not on Spiriva but says he was at home--- ordering Incruse Ellipta  now  Acute superimposed on chronic heart failure  Was not taking diuretics at home has cardiologist Dr Madireddy who saw him in June Increase Lasix  to 80 twice daily IV watch creatinine, I's/O only -3.8L so far since hospital stay Volume restriction 2000 cc--- standing weight  Hypokalemia secondary to diuresis Increase K-Dur to 40 3 times daily give 2 g of IV mag check both mag and labs in a.m.  Recent hemorrhagic CVA midline shift Saw Dr Rosemarie in the  outpatient setting as above Per my discussion with Dr. Jerri was placed on heparin  and I have discussed this fully with the family Dysphagia is resolving from dysphagia 3 and was graduated to regular diet --continue Zetia  10 atorvastatin   40 Have asked that neurology discussed with family about safety of DOAC I am to initiate this probably tomorrow  DVT/PE 2023 Resuming DOAC as above Will call and discussed this with family  Diabetes mellitus type 2 A1c 6.7 Resuming metformin  500 twice daily BGs ranging 166-132 Continue sliding scale as above moderate coverage and check again in a.m.  Acute urinary retention Had catheter exchanged in the ED as above with negative culture Clamp Foley today and see if we can get him to void otherwise outpatient follow-up  Called daughter Arland [phone] and spoke with her on 11/23  Data Reviewed today:   Sodium 140 potassium 3.1 chloride 101 bicarb 24 BUN/creatinine 7/0.6 WBC 6.2 hemoglobin 12.0  DVT prophylaxis: SCD  Status is: Inpatient Inpatient pending resolution of pneumonia likely may need skilled therapy to see-PT eval recommends potential therapy/skilled placement    Dispo/Global plan: See above   Discussed with family on 11/22  Time 60   Subjective:   Coherent awake overall looks a little better but still breathing a little hard Feels stopped up so given flonase  Coughing Sputum ++ No fever no chills no nausea currently  Objective + exam Vitals:   06/19/24 2020 06/20/24 0500 06/20/24 0609 06/20/24 0806  BP: 122/69  (!) 160/65 (S) (!) 151/62  Pulse: 84  74 97  Resp: 19   19  Temp: 98.1 F (36.7 C)  98.4 F (36.9 C) 98 F (36.7 C)  TempSrc:   Oral   SpO2: 95%  93% 91%  Weight:  130.2 kg    Height:       Filed Weights   06/18/24 0602 06/19/24 0500 06/20/24 0500  Weight: (!) 149.7 kg 135.2 kg 130.2 kg     Examination: Awake no distress pupils reactive Power diminished slight on left side-finger-nose-finger is intact Abdomen is distended slightly no rebound He has venous stasis on his legs with some pitting edema grade 2 He is coherent awake alert Chest is clear posteriorly some crackles ROM is intact   Scheduled Meds:  atorvastatin   40 mg  Oral Daily   Chlorhexidine  Gluconate Cloth  6 each Topical Daily   DULoxetine   30 mg Oral Daily   ezetimibe   10 mg Oral Daily   fluticasone   1 spray Each Nare Daily   furosemide   40 mg Intravenous BID   insulin  aspart  0-15 Units Subcutaneous TID WC   loratadine   10 mg Oral Daily   metFORMIN   500 mg Oral BID WC   pantoprazole   40 mg Oral Daily   potassium chloride   40 mEq Oral TID   Continuous Infusions:  cefTRIAXone  (ROCEPHIN )  IV 1 g (06/19/24 1309)   heparin  2,100 Units/hr (06/19/24 2056)   acetaminophen  **OR** acetaminophen , bisacodyl , HYDROcodone -acetaminophen , ipratropium-albuterol , ondansetron  **OR** ondansetron  (ZOFRAN ) IV, senna-docusate  Colen Grimes, MD  [facility]

## 2024-06-20 NOTE — Progress Notes (Signed)
 PHARMACY - ANTICOAGULATION CONSULT NOTE  Pharmacy Consult for IV heparin   Indication: DVT treatment, stroke protocol  No Active Allergies   Patient Measurements: Height: 6' 1 (185.4 cm) Weight: 130.2 kg (287 lb 0.6 oz) IBW/kg (Calculated) : 79.9 HEPARIN  DW (KG): 114.8  Vital Signs: Temp: 98 F (36.7 C) (11/24 0806) BP: 151/62 (11/24 0806) Pulse Rate: 97 (11/24 0806)  Labs: Recent Labs    06/18/24 0352 06/18/24 1607 06/19/24 0428 06/19/24 1144 06/19/24 2141 06/20/24 0422  HGB 12.0*  --  12.0*  --   --  13.5  HCT 35.3*  --  35.2*  --   --  39.4  PLT 176  --  163  --   --  202  HEPARINUNFRC  --    < > 0.17* 0.28* 0.37 0.41  CREATININE 0.68  --  0.61  --   --  0.60*   < > = values in this interval not displayed.    Estimated Creatinine Clearance: 118.1 mL/min (A) (by C-G formula based on SCr of 0.6 mg/dL (L)).   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   Aortic root dilatation 08/27/2021   Aortic valve stenosis 10/14/2023   At risk for heart failure 01/29/2022   Bilateral carotid artery stenosis 08/12/2016   CAD (coronary artery disease) 01/11/2024   Candidal intertrigo 02/06/2022   Cellulitis of right leg 08/05/2021   Chronic diarrhea 05/26/2017   Chronic hypoxemic respiratory failure (HCC) 08/11/2023   Chronic obstructive lung disease (HCC) 08/27/2021   Chronic pain of both hips 12/29/2018   Chronic pain of both knees 07/11/2016   Chronic pain of both shoulders 12/29/2018   Chronic ulcer of right foot (HCC) 08/27/2021   Congestive heart failure (HCC) 07/06/2019   Continuous opioid dependence (HCC) 08/10/2023   Contracture of both Achilles tendons 02/01/2018   Essential hypertension 02/10/2012   Hardening of the aorta (main artery of the heart) 12/03/2021   Heart murmur 09/03/2023   History of pulmonary embolism 08/27/2021   Joint  pain 08/27/2021   Microalbuminuria due to type 2 diabetes mellitus (HCC) 07/25/2022   Mixed hyperlipidemia 07/11/2016   Morbid obesity (HCC) 07/06/2019   Obstructive lung disease (HCC) 07/06/2019   Obstructive sleep apnea (adult) (pediatric) 05/26/2017   Onychomycosis due to dermatophyte 09/28/2018   Osteoarthritis of right knee 12/24/2021   Other specified personal risk factors, not elsewhere classified 12/03/2021   Pre-ulcerative calluses 02/01/2018   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 On coumadin for 3 months.     Last Assessment & Plan:   Relevant Hx:  Course:  Daily Update:  Today's Plan:     Shortness of breath 02/10/2012   Smoking addiction 01/11/2024   Statin not tolerated 12/31/2021   Tobacco abuse 07/06/2019   Active tobacco use 2 packs/day x 34 years     Type 2 diabetes mellitus (HCC) 02/10/2012   A. With retinopathy and neuropathy  B. Long-term use of insulin      Type 2 diabetes mellitus with diabetic neuropathy, with long-term current use of insulin  (HCC) 07/11/2016   Ulcer of midfoot due to diabetes mellitus (HCC) 12/03/2021   Venous insufficiency of right leg 08/05/2021   Venous stasis 08/12/2016   Assessment: Darren SAUNDERS  Hunt is a 73 y.o. year old male admitted on 06/17/2024 with concern for sepsis and found to have. 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 Previously remote history treated with warfarin for hx of DVT PE in 2023. Of note, [facility] 11/22 with evolving right MCA territory infarct with decreased edema, decreased multifocal hemorrhage and decreased leftward midline shift (now trace). Pharmacy consulted to dose heparin  (stroke protocol).  Pharmacy consulted to switch heparin  gtt > eliquis  . Per consult proceed  without Eliquis  loading dose.    Goal of Therapy:  Heparin  level 0.3-0.5 units/ml Monitor platelets by anticoagulation protocol: Yes   Plan:  Stop heparin  gtt  Give first dose of Eliquis  at the time of heparin  infusion discontinuation  Eliquis  5 BID  F/u s/sx bleeding and CBC   Sharyne Glatter, PharmD, BCCCP Critical Care Clinical Pharmacist 06/20/2024 6:20 PM

## 2024-06-20 NOTE — Plan of Care (Signed)
 Discussed with Dr. Royal, pt seems tolerating heparin  IV for the last 2 days, will switch to eliquis  today. Pt wife also wanted to know if Dr. Rosemarie can see the CT we did 2 days ago, and Dr. Royal will let her know that Dr. Rosemarie should be able to view the CT in the system. I will also route this note to Dr. Rosemarie to let him know.   Ary Cummins, MD PhD Stroke Neurology 06/20/2024 11:51 AM

## 2024-06-20 NOTE — Telephone Encounter (Signed)
 Patient Product/process development scientist completed.    The patient is insured through [employer]. Patient has Medicare and is not eligible for a copay card, but may be able to apply for patient assistance or Medicare RX Payment Plan (Patient Must reach out to their plan, if eligible for payment plan), if available.    Ran test claim for Eliquis 5 mg and the current 30 day co-pay is $0.00.  Ran test claim for Xarelto 20 mg and the current 30 day co-pay is $0.00.    This test claim was processed through [facility]- copay amounts may vary at other pharmacies due to pharmacy/plan contracts, or as the patient moves through the different stages of their insurance plan.     Reyes Sharps, CPHT Pharmacy Technician Patient Advocate Specialist Lead [facility] Patient Advocate Team Direct Number: [phone]  Fax: [phone]

## 2024-06-21 ENCOUNTER — Other Ambulatory Visit (HOSPITAL_COMMUNITY): Payer: Self-pay

## 2024-06-21 ENCOUNTER — Telehealth (HOSPITAL_COMMUNITY): Payer: Self-pay

## 2024-06-21 DIAGNOSIS — A419 Sepsis, unspecified organism: Secondary | ICD-10-CM | POA: Diagnosis not present

## 2024-06-21 LAB — CBC WITH DIFFERENTIAL/PLATELET
Abs Immature Granulocytes: 0.02 K/uL (ref 0.00–0.07)
Basophils Absolute: 0 K/uL (ref 0.0–0.1)
Basophils Relative: 1 %
Eosinophils Absolute: 0.2 K/uL (ref 0.0–0.5)
Eosinophils Relative: 3 %
HCT: 37.8 % — ABNORMAL LOW (ref 39.0–52.0)
Hemoglobin: 12.9 g/dL — ABNORMAL LOW (ref 13.0–17.0)
Immature Granulocytes: 0 %
Lymphocytes Relative: 32 %
Lymphs Abs: 2.1 K/uL (ref 0.7–4.0)
MCH: 30.6 pg (ref 26.0–34.0)
MCHC: 34.1 g/dL (ref 30.0–36.0)
MCV: 89.8 fL (ref 80.0–100.0)
Monocytes Absolute: 0.7 K/uL (ref 0.1–1.0)
Monocytes Relative: 10 %
Neutro Abs: 3.6 K/uL (ref 1.7–7.7)
Neutrophils Relative %: 54 %
Platelets: 230 K/uL (ref 150–400)
RBC: 4.21 MIL/uL — ABNORMAL LOW (ref 4.22–5.81)
RDW: 14.7 % (ref 11.5–15.5)
WBC: 6.6 K/uL (ref 4.0–10.5)
nRBC: 0 % (ref 0.0–0.2)

## 2024-06-21 LAB — MAGNESIUM: Magnesium: 1.7 mg/dL (ref 1.7–2.4)

## 2024-06-21 LAB — COMPREHENSIVE METABOLIC PANEL WITH GFR
ALT: 14 U/L (ref 0–44)
AST: 17 U/L (ref 15–41)
Albumin: 2.8 g/dL — ABNORMAL LOW (ref 3.5–5.0)
Alkaline Phosphatase: 51 U/L (ref 38–126)
Anion gap: 11 (ref 5–15)
BUN: 10 mg/dL (ref 8–23)
CO2: 27 mmol/L (ref 22–32)
Calcium: 8.4 mg/dL — ABNORMAL LOW (ref 8.9–10.3)
Chloride: 99 mmol/L (ref 98–111)
Creatinine, Ser: 0.8 mg/dL (ref 0.61–1.24)
GFR, Estimated: >60 mL/min (ref >60)
Glucose, Bld: 149 mg/dL — ABNORMAL HIGH (ref 70–99)
Potassium: 3.8 mmol/L (ref 3.5–5.1)
Sodium: 137 mmol/L (ref 135–145)
Total Bilirubin: 1.2 mg/dL (ref 0.0–1.2)
Total Protein: 6.1 g/dL — ABNORMAL LOW (ref 6.5–8.1)

## 2024-06-21 LAB — GLUCOSE, CAPILLARY
Glucose-Capillary: 161 mg/dL — ABNORMAL HIGH (ref 70–99)
Glucose-Capillary: 178 mg/dL — ABNORMAL HIGH (ref 70–99)
Glucose-Capillary: 162 mg/dL — ABNORMAL HIGH (ref 70–99)
Glucose-Capillary: 174 mg/dL — ABNORMAL HIGH (ref 70–99)

## 2024-06-21 MED ORDER — LEVOFLOXACIN 500 MG PO TABS
500.0000 mg | ORAL_TABLET | Freq: Every day | ORAL | Status: DC
  Administered 2024-06-22: 500 mg via ORAL
  Filled 2024-06-21: qty 1

## 2024-06-21 MED ORDER — POTASSIUM CHLORIDE CRYS ER 20 MEQ PO TBCR
40.0000 meq | EXTENDED_RELEASE_TABLET | Freq: Every day | ORAL | Status: DC
  Administered 2024-06-21 – 2024-06-22 (×2): 40 meq via ORAL
  Filled 2024-06-21 (×2): qty 2

## 2024-06-21 MED ORDER — MAGNESIUM OXIDE -MG SUPPLEMENT 400 (240 MG) MG PO TABS
400.0000 mg | ORAL_TABLET | Freq: Two times a day (BID) | ORAL | Status: DC
Start: 2024-06-21 — End: 2024-06-22
  Administered 2024-06-21 – 2024-06-22 (×3): 400 mg via ORAL
  Filled 2024-06-21 (×3): qty 1

## 2024-06-21 NOTE — Progress Notes (Addendum)
 [facility]   ROUNDING   NOTE Darren Hunt FMW:[id number]  DOB: 08/26/1950  DOA: 06/17/2024  PCP: Nadine Columbus, FNP  06/21/2024,11:05 AM  LOS: 4 days    Code Status: DNR     from: Home   73 year old male currently bedbound  known chronic smoking COPD respiratory failure on 3 L  bilateral carotid artery stent 2014 Previous DVT PE 2023 aortoiliac disease with left iliac aneurysm DM TY 2 Fournier's gangrene 2019 CAD with CTO RCA with collaterals from left to right on cath 2013 at [facility] on aspirin  325 before all of his previous admissions Recent aortic stenosis diagnosis Last echo performed 05/09/2024 showed EF 55-60% degenerative mitral valve moderate aortic stenosis grade 1 diastolic dysfunction  Recent admission [facility] outside [facility] 9/28-10/10) as family wanted a second opinion and was brought here by private ambulance  [ischemic CVA cytotoxic edema with midline shift-antiplatelets therapy held-left popliteal DVT/PE noted as well] He also has a chronic indwelling Foley catheter after he developed left-sided sudden hemiplegia hemianopsia and was treated with IV thrombolytics developing hemorrhagic transformation and had an IVC filter placed during the hospital stay NIH was 10  He did have slightly more midline shift on 10/13 Pradaxa  was held repeat CT 10/14 showed stable changes and was told to follow-up in stroke clinic   On follow-up 11/12 saw Dr Rosemarie neurology in the office-was scheduled for CT head to see the progression or resolution of his midline shift/hemorrhage and is still not on anticoagulation--CT was never accomplished  11/21 return to hospital with weakness fever  and a cough since 11/20 sats in the 70s CBG 250--some element of postnasal drip taking Zyrtec intermittent subjective fevers-nurse found him cyanotic and was brought to the ED  According to report he had a Foley catheter exchanged recently and was being treated for E. coli positive  cultures growing ESBL per 11/4    Pertinent imaging/studies till date  11/21 CXR cardiomegaly pulmonary vascular congestion perihilar interstitial changes edema small left pleural effusion CT head without contrast evolving right MCA infarct with decreased edema decreased multifocal hemorrhage and decreased leftward midline shift now trace 11/23 CXR repeated = resolution of mild pulmonary edema, small left pleural effusion persistent left basilar opacity?  Infection   Assessment  & Plan :    Sepsis on admission secondary to pneumonia Rhinovirus positive on RVP Recent treated ESBL 11/4 UA this admission negative- Vancomycin  and cefepime  narrowed to ceftriaxone  and now will switch to Levaquin  p.o. 500 daily and can complete the Levaquin  on 11/26 stop date placed Supportive management for rhinoviral illness  Right lower extremity blisters sustained while at nursing home Not significant only occlusive dressing at this time  Acute superimposed on chronic respiratory failure--- is on 3 L of oxygen at home has COPD as well Nasal stuffiness as well as sinus issues Claritin  reordered humidify oxygen continue Flonase  1 spray daily Can continue empiric Vicks vapor rub OSA COPD Ensure compliance on BiPAP he has nasal PAP that he uses Continuing DuoNeb 3 mL every 6 as needed--was not on Spiriva but says he was at home--- ordering Incruse Ellipta  now  Acute superimposed on chronic heart failure Was not taking diuretics at home has cardiologist Dr Madireddy who saw him in June Increase Lasix  to IV 80 twice from 40 mg ---currently is -5.1, weight is down well into the 130 KG range--- note that weight 10/11 with 330 pounds--- he is not sure what his dry weight is because of  all of his hospital stays Volume restriction 2000 cc--- standing weight if at all able it sounds like he is pretty much bedbound Will CC his cardiologist in [facility] to ensure that he has a TOC visit for November-will need to be  seen within 2 weeks from discharge  Hypokalemia secondary to diuresis Oral replacement given aggressively now potassium 3.8 Magnesium  sulfate IV given earlier now give Mag-Ox 400 twice daily  Recent hemorrhagic CVA midline shift Saw Dr Rosemarie in the outpatient setting as above Per my discussion with Dr. Jerri was placed on heparin  and I have discussed this fully with the family-he was transition to DOAC Eliquis  on 11/25 and has remained stable without any acute issue Dysphagia is resolving from dysphagia 3 and was graduated to regular diet --continue Zetia  10 atorvastatin  40  DVT/PE 2023 Resuming DOAC as above  Diabetes mellitus type 2 A1c 6.7 Resuming metformin  500 twice daily BGs ranging 170-190 Continue sliding scale as above moderate coverage Do not anticipate needing insulin  at home  Acute urinary retention Had catheter exchanged in the ED as above with negative culture Outpatient voiding trial  Data Reviewed today:   Sodium 140 potassium 3.1 chloride 101 bicarb 24 BUN/creatinine 7/0.6 WBC 6.2 hemoglobin 12.0  DVT prophylaxis: SCD  Status is: Inpatient Inpatient pending resolution of pneumonia likely may need skilled therapy to see-PT eval recommends potential therapy/skilled placement    Dispo/Global plan: See above   Discussed with family on 11/24 and updated them fully  Time 60   Subjective:   He looks much better today he is off of his nasal CPAP He seems shocked that the rhinovirus can cause so much issues He was up out of bed with therapy but is still a little bit winded and quite debilitated and it seems that he requires close to max assist to get out of bed No chest pain no fever no headache no blurred vision no double vision   Objective + exam Vitals:   06/20/24 2046 06/20/24 2051 06/21/24 0455 06/21/24 0740  BP: (!) 140/80  (!) 128/53 (!) 134/57  Pulse: 93 93 81 90  Resp: 18  18 18   Temp: 98.8 F (37.1 C)  98.2 F (36.8 C) 97.9 F (36.6 C)   TempSrc:    Oral  SpO2: 96% 96% 93% 90%  Weight:      Height:       Filed Weights   06/18/24 0602 06/19/24 0500 06/20/24 0500  Weight: (!) 149.7 kg 135.2 kg 130.2 kg     Examination:  Coherent awake oriented no ethmoidal or frontal tenderness Chest is clear with no wheeze or rales posterior laterally Abdomen is obese nontender 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 sole bleb on the right foot Neurologically is intact moving 4 limbs equally   Scheduled Meds:  apixaban   5 mg Oral BID   atorvastatin   40 mg Oral Daily   Chlorhexidine  Gluconate Cloth  6 each Topical Daily   DULoxetine   30 mg Oral Daily   ezetimibe   10 mg Oral Daily   fluticasone   1 spray Each Nare Daily   furosemide   80 mg Intravenous BID   insulin  aspart  0-15 Units Subcutaneous TID WC   [START ON 06/22/2024] levofloxacin   500 mg Oral Daily   loratadine   10 mg Oral Daily   magnesium  oxide  400 mg Oral BID   metFORMIN   500 mg Oral BID WC   pantoprazole   40 mg Oral Daily  potassium chloride   40 mEq Oral Daily   umeclidinium bromide   1 puff Inhalation Daily   Continuous Infusions:   acetaminophen  **OR** acetaminophen , bisacodyl , HYDROcodone -acetaminophen , ipratropium-albuterol , Muscle Rub, ondansetron  **OR** ondansetron  (ZOFRAN ) IV, senna-docusate  Colen Grimes, MD  [facility]

## 2024-06-21 NOTE — Telephone Encounter (Signed)
 Pharmacy Patient Advocate Encounter  Received notification from [employer] that Prior Authorization for Incruse Ellipta  62.75mcg has been APPROVED from 06/21/25 to 07/27/25. Ran test claim, Copay is $0. This test claim was processed through [facility]- copay amounts may vary at other pharmacies due to pharmacy/plan contracts, or as the patient moves through the different stages of their insurance plan.   PA #/Case ID/Reference #: [id number]

## 2024-06-21 NOTE — Progress Notes (Signed)
 Physical Therapy Treatment Patient Details Name: Darren Hunt MRN: [id number] DOB: 1951-02-28 Today's Date: 06/21/2024   History of Present Illness Pt is a 73 y.o. male admitted 11/21 with sepsis. 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 Pt performed LE HEP seated. Attempted sit to stand with RW, but pt unable to power up from low surface. Stedy retrieved. Sit to stand mod assist +2 safety in stedy.  Dependent stedy transfer recliner to bed. Mod assist sit to supine. VSS on RA. Current POC remains appropriate.   If plan is discharge home, recommend the following: A lot of help with walking and/or transfers;A lot of help with bathing/dressing/bathroom;Help with stairs or ramp for entrance;Assistance with cooking/housework   Can travel by private vehicle        Equipment Recommendations  None recommended by PT    Recommendations for Other Services       Precautions / Restrictions Precautions Precautions: Fall;Other (comment) Precaution/Restrictions Comments: foley, L hemi     Mobility  Bed Mobility Overal bed mobility: Needs Assistance Bed Mobility: Sit to Supine       Sit to supine: Mod assist, HOB elevated, Used rails   General bed mobility comments: assist with trunk and LLE back to bed    Transfers Overall transfer level: Needs assistance   Transfers: Sit to/from Stand, Bed to chair/wheelchair/BSC Sit to Stand: +2 safety/equipment, Mod assist           General transfer comment: Unable to power up to stand with RW from low recliner. Mod assist +2 safety sit to stand from recliner using  stedy.    Ambulation/Gait               General Gait Details: nonamb at baseline   Stairs             Wheelchair Mobility     Tilt Bed    Modified Rankin (Stroke Patients Only)       Balance Overall balance assessment: Needs assistance Sitting-balance support: Feet supported, No upper extremity supported Sitting balance-Leahy Scale: Good     Standing balance support: During functional activity, Reliant on assistive device for balance Standing balance-Leahy Scale: Poor                              Communication Communication Communication: No apparent difficulties  Cognition Arousal: Alert Behavior During Therapy: WFL for tasks assessed/performed   PT - Cognitive impairments: No family/caregiver present to determine baseline, Memory, Problem solving, Safety/Judgement, Sequencing                         Following commands: Intact      Cueing Cueing Techniques: Verbal cues  Exercises General Exercises - Lower Extremity Ankle Circles/Pumps: AROM, Both, 15 reps, Seated Gluteal Sets: AROM, Both, 10 reps, Seated Long Arc Quad: AROM, Right, Left, 10 reps, Seated    General Comments General comments (skin integrity, edema, etc.): VSS on RA      Pertinent Vitals/Pain Pain Assessment Pain Assessment: No/denies pain    Home Living  Prior Function            PT Goals (current goals can now be found in the care plan section) Acute Rehab PT Goals Patient Stated Goal: home Progress towards PT goals: Progressing toward goals    Frequency    Min 2X/week      PT Plan      Co-evaluation              AM-PAC PT 6 Clicks Mobility   Outcome Measure  Help needed turning from your back to your side while in a flat bed without using bedrails?: A Little Help needed moving from lying on your back to sitting on the side of a flat bed without using bedrails?: A Little Help needed moving  to and from a bed to a chair (including a wheelchair)?: Total Help needed standing up from a chair using your arms (e.g., wheelchair or bedside chair)?: Total Help needed to walk in hospital room?: Total Help needed climbing 3-5 steps with a railing? : Total 6 Click Score: 10    End of Session Equipment Utilized During Treatment: Gait belt Activity Tolerance: Patient tolerated treatment well Patient left: in bed;with call bell/phone within reach;with bed alarm set Nurse Communication: Mobility status PT Visit Diagnosis: Other abnormalities of gait and mobility (R26.89);Muscle weakness (generalized) (M62.81)     Time: 9175-9147 PT Time Calculation (min) (ACUTE ONLY): 28 min  Charges:    $Therapeutic Exercise: 8-22 mins $Therapeutic Activity: 8-22 mins PT General Charges $$ ACUTE PT VISIT: 1 Visit                     Sari MATSU., PT  Office # [phone]    Erven Sari Shaker 06/21/2024, 11:27 AM

## 2024-06-21 NOTE — Progress Notes (Signed)
 Speech Language Pathology Treatment: Dysphagia  Patient Details Name: Darren Hunt MRN: [id number] DOB: Jan 17, 1951 Today's Date: 06/21/2024 Time: 8480-8470 SLP Time Calculation (min) (ACUTE ONLY): 10 min  Assessment / Plan / Recommendation Clinical Impression  Pt demonstrates independence with feeding. He clears unsensed oral residue with a liquid wash given Min cueing. No s/s of aspiration were observed with consecutive sips of thin liquids. Ongoing SLP f/u with HH is recommended but he has no further acute needs. Will sign off.    HPI HPI: Darren Hunt is a 73 y.o. male admitted 11/21 with sepsis. Head CT evolving right MCA territory infarct with decreased edema, decreased multifocal hemorrhage and decreased leftward midline shift. CXR small left pleural effusion with persistent left basilar opacity, which may reflect atelectasis or residual infection. Being treated for pna. BSE 04/2024 mild oral dysphagia, Dys 3/thin recommended-wife reluctant to make changes to diet due to d/c soon. MBSS completed on 06/20/24 observed to be grossly WNL. PMH:  CVA 03/2024 with residual left hemiparesis, CAD s/p PCI, T2DM, HLD, bilateral carotid artery stent, OSA on CPAP at home, tobacco use disorder, COPD, chronic nocturnal hypoxic respiratory failure on 2-3 L [place], HLD, hx of PE/DVT not on OAC due to brain hemorrhages, urinary retention  s/p indwelling foley catheter, morbid obesity, HTN and HFpEF.      SLP Plan  All goals met          Recommendations  Diet recommendations: Regular;Thin liquid Liquids provided via: Cup;Straw Medication Administration: Whole meds with liquid Supervision: Patient able to self feed Compensations: Minimize environmental distractions;Slow rate;Small sips/bites;Monitor for anterior loss Postural Changes and/or Swallow Maneuvers: Seated upright 90 degrees                  Oral care BID   Intermittent Supervision/Assistance Dysphagia, unspecified (R13.10)     All  goals met     Darren Hunt, M.A., CCC-SLP Speech Language Pathology, Acute Rehabilitation Services  Secure Chat preferred [phone]   06/21/2024, 4:37 PM

## 2024-06-21 NOTE — Care Management Important Message (Signed)
 Important Message  Patient Details  Name: Darren Hunt MRN: [id number] Date of Birth: Oct 23, 1950   Important Message Given:  Yes - Medicare IM     Vonzell Arrie Sharps 06/21/2024, 12:53 PM

## 2024-06-21 NOTE — Discharge Instructions (Signed)

## 2024-06-21 NOTE — Progress Notes (Signed)
 Occupational Therapy Treatment Patient Details Name: Darren Hunt MRN: [id number] DOB: Jun 09, 1951 Today's Date: 06/21/2024   History of present illness Pt is a 73 y.o. male admitted 11/21 with sepsis. 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 Pt able to complete seated grooming task with min A seated in chair, provided with green foam cube for L hand strengthening and pt able to demo. Pt presenting with impairments listed below, will follow acutely. Continue to recommend HHOT at d/c.       If plan is discharge home, recommend the following:  Two people to help with walking and/or transfers;A lot of help with bathing/dressing/bathroom;Assistance with cooking/housework;Direct supervision/assist for medications management;Direct supervision/assist for financial management;Assist for transportation;Help with stairs or ramp for entrance   Equipment Recommendations  None recommended by OT    Recommendations for Other Services PT consult    Precautions / Restrictions Precautions Precautions: Fall;Other (comment) Precaution/Restrictions Comments: foley Restrictions Weight Bearing Restrictions Per Provider Order: No       Mobility Bed Mobility Overal bed mobility: Needs Assistance Bed Mobility: Supine to Sit     Supine to sit: Min assist     General bed mobility comments: incr time to come to EOB    Transfers Overall transfer level: Needs assistance Equipment used: Rolling walker (2 wheels) Transfers: Sit to/from Stand, Bed to  chair/wheelchair/BSC Sit to Stand: Mod assist, +2 physical assistance Stand pivot transfers: Mod assist, +2 physical assistance               Balance Overall balance assessment: Needs assistance Sitting-balance support: Feet supported, No upper extremity supported Sitting balance-Leahy Scale: Good     Standing balance support: During functional activity, Reliant on assistive device for balance Standing balance-Leahy Scale: Poor Standing balance comment: heavy relaince on external support                           ADL either performed or assessed with clinical judgement   ADL Overall ADL's : Needs assistance/impaired     Grooming: Sitting;Minimal assistance Grooming Details (indicate cue type and reason): brushing teeth seated in chair                 Toilet Transfer: Moderate assistance;+2 for physical assistance;Stand-pivot;Rolling walker (2 wheels)   Toileting- Clothing Manipulation and Hygiene: Total assistance Toileting - Clothing Manipulation Details (indicate cue type and reason): catheter     Functional mobility during ADLs: Moderate assistance;+2 for physical assistance;Rolling walker (2 wheels)      Extremity/Trunk Assessment Upper Extremity Assessment Upper Extremity Assessment: Right hand dominant LUE Deficits / Details: 3/5 globally, difficulty sustaining grasp, decr FMC LUE Sensation: decreased light touch;decreased proprioception LUE Coordination: decreased fine motor;decreased gross motor   Lower Extremity Assessment Lower Extremity Assessment: Defer to PT evaluation        Vision   Additional Comments: not formally assessed   Perception Perception Perception: Not tested   Praxis Praxis Praxis: Not tested   Communication Communication Communication: No apparent difficulties   Cognition Arousal:  Alert Behavior During Therapy: WFL for tasks assessed/performed                                 Following  commands: Intact        Cueing   Cueing Techniques: Verbal cues  Exercises Exercises: Other exercises Other Exercises Other Exercises: Foam squeeze x5 LUE with green foam cube    Shoulder Instructions       General Comments VSS    Pertinent Vitals/ Pain       Pain Assessment Pain Assessment: No/denies pain  Home Living                                          Prior Functioning/Environment              Frequency  Min 2X/week        Progress Toward Goals  OT Goals(current goals can now be found in the care plan section)  Progress towards OT goals: Progressing toward goals  Acute Rehab OT Goals Patient Stated Goal: did not state OT Goal Formulation: With patient Time For Goal Achievement: 07/02/24 Potential to Achieve Goals: Good ADL Goals Pt Will Transfer to Toilet: squat pivot transfer;stand pivot transfer;bedside commode;with contact guard assist Pt/caregiver will Perform Home Exercise Program: Increased ROM;Increased strength;Left upper extremity;With minimal assist;With written HEP provided Additional ADL Goal #1: pt will perform bed mobility with mod I in prep for seated ADLs  Plan      Co-evaluation                 AM-PAC OT 6 Clicks Daily Activity     Outcome Measure   Help from another person eating meals?: A Little Help from another person taking care of personal grooming?: A Little Help from another person toileting, which includes using toliet, bedpan, or urinal?: A Lot Help from another person bathing (including washing, rinsing, drying)?: A Lot Help from another person to put on and taking off regular upper body clothing?: A Lot Help from another person to put on and taking off regular lower body clothing?: A Lot 6 Click Score: 14    End of Session Equipment Utilized During Treatment: Gait belt;Rolling walker (2 wheels);Oxygen  OT Visit Diagnosis: Unsteadiness on feet (R26.81);Other abnormalities of gait and  mobility (R26.89);Muscle weakness (generalized) (M62.81)   Activity Tolerance Patient tolerated treatment well   Patient Left in chair;with call bell/phone within reach;with chair alarm set   Nurse Communication Mobility status; use of stedy for back to bed, wound on L foot        Time: 9253-9189 OT Time Calculation (min): 24 min  Charges: OT General Charges $OT Visit: 1 Visit OT Treatments $Self Care/Home Management : 8-22 mins $Therapeutic Exercise: 8-22 mins  Weslie Pretlow K, OTD, OTR/L SecureChat Preferred Acute Rehab [phone]   Laneta POUR Koonce 06/21/2024, 8:43 AM

## 2024-06-22 DIAGNOSIS — J189 Pneumonia, unspecified organism: Secondary | ICD-10-CM

## 2024-06-22 LAB — CBC WITH DIFFERENTIAL/PLATELET
Abs Immature Granulocytes: 0.03 K/uL (ref 0.00–0.07)
Basophils Absolute: 0 K/uL (ref 0.0–0.1)
Basophils Relative: 1 %
Eosinophils Absolute: 0.2 K/uL (ref 0.0–0.5)
Eosinophils Relative: 3 %
HCT: 38.9 % — ABNORMAL LOW (ref 39.0–52.0)
Hemoglobin: 13.3 g/dL (ref 13.0–17.0)
Immature Granulocytes: 0 %
Lymphocytes Relative: 35 %
Lymphs Abs: 2.6 K/uL (ref 0.7–4.0)
MCH: 31 pg (ref 26.0–34.0)
MCHC: 34.2 g/dL (ref 30.0–36.0)
MCV: 90.7 fL (ref 80.0–100.0)
Monocytes Absolute: 0.7 K/uL (ref 0.1–1.0)
Monocytes Relative: 9 %
Neutro Abs: 3.8 K/uL (ref 1.7–7.7)
Neutrophils Relative %: 52 %
Platelets: 258 K/uL (ref 150–400)
RBC: 4.29 MIL/uL (ref 4.22–5.81)
RDW: 14.9 % (ref 11.5–15.5)
WBC: 7.3 K/uL (ref 4.0–10.5)
nRBC: 0 % (ref 0.0–0.2)

## 2024-06-22 LAB — CULTURE, BLOOD (ROUTINE X 2)
Culture: NO GROWTH
Culture: NO GROWTH
Special Requests: ADEQUATE

## 2024-06-22 LAB — COMPREHENSIVE METABOLIC PANEL WITH GFR
ALT: 17 U/L (ref 0–44)
AST: 18 U/L (ref 15–41)
Albumin: 2.9 g/dL — ABNORMAL LOW (ref 3.5–5.0)
Alkaline Phosphatase: 51 U/L (ref 38–126)
Anion gap: 11 (ref 5–15)
BUN: 11 mg/dL (ref 8–23)
CO2: 27 mmol/L (ref 22–32)
Calcium: 8.6 mg/dL — ABNORMAL LOW (ref 8.9–10.3)
Chloride: 99 mmol/L (ref 98–111)
Creatinine, Ser: 0.89 mg/dL (ref 0.61–1.24)
GFR, Estimated: >60 mL/min (ref >60)
Glucose, Bld: 168 mg/dL — ABNORMAL HIGH (ref 70–99)
Potassium: 3.6 mmol/L (ref 3.5–5.1)
Sodium: 137 mmol/L (ref 135–145)
Total Bilirubin: 0.9 mg/dL (ref 0.0–1.2)
Total Protein: 6.2 g/dL — ABNORMAL LOW (ref 6.5–8.1)

## 2024-06-22 LAB — GLUCOSE, CAPILLARY
Glucose-Capillary: 170 mg/dL — ABNORMAL HIGH (ref 70–99)
Glucose-Capillary: 156 mg/dL — ABNORMAL HIGH (ref 70–99)

## 2024-06-22 LAB — MAGNESIUM: Magnesium: 1.5 mg/dL — ABNORMAL LOW (ref 1.7–2.4)

## 2024-06-22 MED ORDER — MAGNESIUM SULFATE 2 GM/50ML IV SOLN
2.0000 g | Freq: Once | INTRAVENOUS | Status: AC
  Administered 2024-06-22: 2 g via INTRAVENOUS
  Filled 2024-06-22: qty 50

## 2024-06-22 MED ORDER — FUROSEMIDE 40 MG PO TABS: 40.0000 mg | ORAL_TABLET | Freq: Every day | ORAL | Status: DC

## 2024-06-22 MED ORDER — POTASSIUM CHLORIDE CRYS ER 20 MEQ PO TBCR: 20.0000 meq | EXTENDED_RELEASE_TABLET | Freq: Every day | ORAL | 0 refills | Status: AC

## 2024-06-22 MED ORDER — IPRATROPIUM-ALBUTEROL 0.5-2.5 (3) MG/3ML IN SOLN: 3.0000 mL | Freq: Four times a day (QID) | RESPIRATORY_TRACT | 0 refills | Status: AC | PRN

## 2024-06-22 MED ORDER — MAGNESIUM OXIDE -MG SUPPLEMENT 400 (240 MG) MG PO TABS: 400.0000 mg | ORAL_TABLET | Freq: Two times a day (BID) | ORAL | 0 refills | Status: AC

## 2024-06-22 MED ORDER — APIXABAN 5 MG PO TABS: 5.0000 mg | ORAL_TABLET | Freq: Two times a day (BID) | ORAL | 0 refills | Status: DC

## 2024-06-22 MED ORDER — FLUTICASONE PROPIONATE 50 MCG/ACT NA SUSP: 1.0000 | Freq: Every day | NASAL | 2 refills | Status: AC

## 2024-06-22 MED ORDER — FUROSEMIDE 40 MG PO TABS: 40.0000 mg | ORAL_TABLET | Freq: Every day | ORAL | 1 refills | Status: AC

## 2024-06-22 MED ORDER — UMECLIDINIUM BROMIDE 62.5 MCG/ACT IN AEPB: 1.0000 | INHALATION_SPRAY | Freq: Every day | RESPIRATORY_TRACT | 0 refills | Status: AC

## 2024-06-22 MED ORDER — LEVOFLOXACIN 500 MG PO TABS: 500.0000 mg | ORAL_TABLET | Freq: Every day | ORAL | 0 refills | Status: AC

## 2024-06-22 NOTE — Telephone Encounter (Signed)
 Called pt's wife and let her know Per Dr. Rosemarie he can us  the CT Scan of the head that pt got done at the hospital due to it being the same scan that he ordered.

## 2024-06-22 NOTE — Discharge Summary (Signed)
 Physician Discharge Summary   Patient: Darren Hunt MRN: [id number] DOB: [birth year]  Admit date:     06/17/2024  Discharge date: 06/22/24  Discharge Physician: Owen DELENA Lore   PCP: Nadine Columbus, FNP   Recommendations at discharge:    Needs Bmet, and Mg to follow level.  Needs follow up with cardiology for further management of HF.  Needs to follow up with neurology   Discharge Diagnoses: Principal Problem:   Sepsis (HCC) Active Problems:   OSA on CPAP   Acute on chronic diastolic CHF (congestive heart failure) (HCC)   Acute on chronic hypoxic respiratory failure (HCC)   Type 2 diabetes mellitus with hyperglycemia (HCC)   History of CVA (cerebrovascular accident)   Morbid obesity (HCC)  Resolved Problems:   * No resolved hospital problems. [facility] Course: 73 year old bedbound, no chronic smoking, COPD on 3 L of oxygen at night, bilateral carotid artery stent 2014, previous DVT PE 2023, aortoiliac disease with left iliac aneurysm, diabetes type 2, history of Fournier's gangrene 2019, CAD with CTO RCA with collaterals from left to right on cath 2013 at [facility] stenosis, last echo performed 10/13 showed ejection fraction 55 to 60% moderate aortic stenosis grade 1 diastolic dysfunction, recent admission to [facility] via outside [facility] 9/28 until 10/10 as family wanted a second opinion was brought here by private ambulance diagnosed at that time with ischemic CVA cytotoxic edema with midline shift, antiplatelet therapy Hello popliteal DVT PE noted as well).  He also has chronic indwelling Foley catheter, during that hospitalization he developed left-sided sudden hemiplegia and hemianopsia and was treated with IV thrombolytics, developed hemorrhagic transformation and had an IVC filter placed during that hospitalization his state. He follow-up with Dr. Saidi 11/12 and was supposed to have his repeated CT head to follow-up on evaluation.  Patient  presented with weakness, fever, cough hypoxia recently treated for E. coli ESBL.  Admitted with sepsis secondary to pneumonia, rhinovirus positive.  He has been transition to Levaquin  to complete 7 days on 11/27.  Also treated with IV diuresis for acute on chronic diastolic heart failure.  Currently -6 L.  Appears euvolemic.  He will be transition to oral Lasix .  Assessment and Plan: 1-Sepsis present on admission secondary to pneumonia Rhino Virus positive -UA negative during this hospitalization Treated initially with vancomycin  and cefepime  and subsequently transition to Levaquin .  He will be completing 7 days of antibiotics 11/27.  Acute on chronic hypoxic respiratory failure on 3 L of oxygen at home at night COPD as well. Acute on chronic heart failure exacerbation. He was started on Lasix  80 mg IV twice daily.  -6 L.  Currently on room air. Stable for discharge on oral Lasix  daily. Continue nebulizers Continue CPAP at bedtime  Right lower extremity blisters sustained while at nursing home Continue local care  Hypomagnesemia; in setting diuretics.  Replaced IV prior to discharge. Discharge on oral supplement.   Hypokalemia replaced   Recent hemorrhagic CVA midline shift Dr. Royal discussed with Dr. Michiel and patient was placed on heparin  subsequently transition on Eliquis  and he has remained stable.   DVT/PE 2023 DOAC resumed this hospitalization  Diabetes type 2: Resume metformin   Acute urinary retention: Catheter was exchanged in the ED       Consultants: neurology  Procedures performed: ct Disposition: Home Diet recommendation:  Discharge Diet Orders (From admission, onward)     Start     Ordered   06/22/24 0000  Diet - low  sodium heart healthy        06/22/24 1113           Cardiac diet DISCHARGE MEDICATION: Allergies as of 06/22/2024   No Active Allergies      Medication List     TAKE these medications    albuterol  108 (90 Base) MCG/ACT  inhaler Commonly known as: VENTOLIN  HFA Inhale 2 puffs into the lungs every 4 (four) hours as needed for wheezing or shortness of breath.   amantadine  100 MG capsule Commonly known as: SYMMETREL  Take 100 mg by mouth daily.   apixaban  5 MG Tabs tablet Commonly known as: ELIQUIS  Take 1 tablet (5 mg total) by mouth 2 (two) times daily.   atorvastatin  40 MG tablet Commonly known as: LIPITOR Take 1 tablet (40 mg total) by mouth daily.   DULoxetine  30 MG capsule Commonly known as: CYMBALTA  Take 1 capsule (30 mg total) by mouth daily.   ezetimibe  10 MG tablet Commonly known as: ZETIA  Take 10 mg by mouth daily.   fluticasone  50 MCG/ACT nasal spray Commonly known as: FLONASE  Place 1 spray into both nostrils daily. Start taking on: June 23, 2024   furosemide  40 MG tablet Commonly known as: LASIX  Take 1 tablet (40 mg total) by mouth daily. Start taking on: June 23, 2024   HYDROcodone -acetaminophen  7.5-325 MG tablet Commonly known as: NORCO Take 1 tablet by mouth 3 (three) times daily as needed for moderate pain (pain score 4-6).   ipratropium-albuterol  0.5-2.5 (3) MG/3ML Soln Commonly known as: DUONEB Take 3 mLs by nebulization every 6 (six) hours as needed.   levofloxacin  500 MG tablet Commonly known as: LEVAQUIN  Take 1 tablet (500 mg total) by mouth daily for 1 day. Start taking on: June 23, 2024   magnesium  oxide 400 (240 Mg) MG tablet Commonly known as: MAG-OX Take 1 tablet (400 mg total) by mouth 2 (two) times daily.   metFORMIN  500 MG 24 hr tablet Commonly known as: GLUCOPHAGE -XR Take 500 mg by mouth 2 (two) times daily.   pantoprazole  40 MG tablet Commonly known as: PROTONIX  Take 40 mg by mouth daily.   potassium chloride  SA 20 MEQ tablet Commonly known as: KLOR-CON  M Take 1 tablet (20 mEq total) by mouth daily. Start taking on: June 23, 2024   TYLENOL  PO Take 2 tablets by mouth as needed.   umeclidinium bromide  62.5 MCG/ACT  Aepb Commonly known as: INCRUSE ELLIPTA  Inhale 1 puff into the lungs daily. Start taking on: June 23, 2024   ZYRTEC PO Take 1 tablet by mouth daily.               Durable Medical Equipment  (From admission, onward)           Start     Ordered   06/22/24 1115  For home use only DME Nebulizer machine  Once       Question Answer Comment  Patient needs a nebulizer to treat with the following condition COPD (chronic obstructive pulmonary disease) (HCC)   Length of Need Lifetime   Additional equipment included Administration kit      06/22/24 1114            Contact information for follow-up providers     Chrisco, Danny, FNP Follow up in 1 week(s).   Specialty: Family Medicine Contact information: [address] [place] [address] [phone]              Contact information for after-discharge care  Home Medical Care     [facility] .   Service: Home Health Services Contact information: [address] [place]  [address] [phone]                    Discharge Exam: Fredricka Weights   06/19/24 0500 06/20/24 0500 06/22/24 9297  Weight: 135.2 kg 130.2 kg 133.2 kg   General; NAD  Condition at discharge: stable  The results of significant diagnostics from this hospitalization (including imaging, microbiology, ancillary and laboratory) are listed below for reference.   Imaging Studies: DG Swallowing Func-Speech Pathology Result Date: 06/20/2024 Table formatting from the original result was not included. Modified Barium Swallow Study Patient Details Name: Darren Hunt MRN: [id number] Date of Birth: October 21, 1950 Today's Date: 06/20/2024 HPI/PMH: HPI: Mr. Luka is a 73 y.o. male admitted 11/21 with sepsis. Head CT evolving right MCA territory infarct with decreased edema, decreased multifocal hemorrhage and decreased leftward midline shift. CXR small left pleural effusion with  persistent left basilar opacity, which may reflect atelectasis or residual infection. Being treated for pna. BSE 04/2024 mild oral dysphagia, Dys 3/thin recommended-wife reluctant to make changes to diet due to d/c soon. MBSS completed on 06/20/24 observed to be grossly WNL. PMH:  CVA 03/2024 with residual left hemiparesis, CAD s/p PCI, T2DM, HLD, bilateral carotid artery stent, OSA on CPAP at home, tobacco use disorder, COPD, chronic nocturnal hypoxic respiratory failure on 2-3 L [place], HLD, hx of PE/DVT not on OAC due to brain hemorrhages, urinary retention  s/p indwelling foley catheter, morbid obesity, HTN and HFpEF. Clinical Impression: Pt presents with a grossly functional oropharyngeal swallow per MBSS completed today. There was occasional instances of transient, shallow penetration of thin liquids during the swallow, primarily during sequential straw sip trials. No aspiration observed across trials. Oral phase significant for piecemeal swallow of pudding and regular solid, though appeared to clear bolus effectively. There were occasional instances of posterior loss of liquids to the pyriform sinuses before the swallow, which is considered WNL for pt's age. Pt exhibited difficulty with oral transit of barium tablet and tablet was eventually expectorated. He denied concerns with swallowing his current medications with liquids and did verbalize that the barium tablet was large. Pharyngeal swallow significant for reduced laryngeal vestibule closure during the swallow, which contributed to intermittent penetration events. Otherwise, pharyngeal swallow appeared WNL. Recommend continue current regular diet and thin liquids as tolerated. PO meds as tolerated. Plan: SLP will follow up to assess diet tolerance and sign off if there are no concerns for aspiration with PO intake. Factors that may increase risk of adverse event in presence of aspiration Noe & Lianne 2021): Factors that may increase risk of adverse event  in presence of aspiration Noe & Lianne 2021): Frail or deconditioned Recommendations/Plan: Swallowing Evaluation Recommendations Swallowing Evaluation Recommendations Recommendations: PO diet PO Diet Recommendation: Regular; Thin liquids (Level 0) Liquid Administration via: Cup; Straw Medication Administration: -- (as tolerated) Supervision: Patient able to self-feed Swallowing strategies  : Slow rate; Small bites/sips Postural changes: Position pt fully upright for meals Oral care recommendations: Oral care BID (2x/day) Treatment Plan Treatment Plan Treatment recommendations: Therapy as outlined in treatment plan below Follow-up recommendations: No SLP follow up Functional status assessment: Patient has had a recent decline in their functional status and demonstrates the ability to make significant improvements in function in a reasonable and predictable amount of time. Treatment frequency: Min 1x/week Treatment duration: 1 week Interventions: Diet toleration  management by SLP Recommendations Recommendations for follow up therapy are one component of a multi-disciplinary discharge planning process, led by the attending physician.  Recommendations may be updated based on patient status, additional functional criteria and insurance authorization. Assessment: Orofacial Exam: Orofacial Exam Oral Cavity: Oral Hygiene: WFL Oral Cavity - Dentition: Missing dentition Orofacial Anatomy: WFL Oral Motor/Sensory Function: WFL Anatomy: Anatomy: WFL Boluses Administered: Boluses Administered Boluses Administered: Thin liquids (Level 0); Mildly thick liquids (Level 2, nectar thick); Puree; Solid  Oral Impairment Domain: Oral Impairment Domain Lip Closure: No labial escape Tongue control during bolus hold: Cohesive bolus between tongue to palatal seal Bolus preparation/mastication: Timely and efficient chewing and mashing Bolus transport/lingual motion: Brisk tongue motion Oral residue: Trace residue lining oral structures  Location of oral residue : Tongue Initiation of pharyngeal swallow : Pyriform sinuses (WNL for pt's age)  Pharyngeal Impairment Domain: Pharyngeal Impairment Domain Soft palate elevation: No bolus between soft palate (SP)/pharyngeal wall (PW) Laryngeal elevation: Complete superior movement of thyroid cartilage with complete approximation of arytenoids to epiglottic petiole Anterior hyoid excursion: Complete anterior movement Epiglottic movement: Complete inversion Laryngeal vestibule closure: Incomplete, narrow column air/contrast in laryngeal vestibule Pharyngeal stripping wave : Present - complete Pharyngeal contraction (A/P view only): N/A Pharyngoesophageal segment opening: Complete distension and complete duration, no obstruction of flow Tongue base retraction: No contrast between tongue base and posterior pharyngeal wall (PPW) Pharyngeal residue: Complete pharyngeal clearance  Esophageal Impairment Domain: Esophageal Impairment Domain Esophageal clearance upright position: Complete clearance, esophageal coating Pill: Pill Consistency administered: Thin liquids (Level 0) Thin liquids (Level 0): Impaired (see clinical impressions) Penetration/Aspiration Scale Score: Penetration/Aspiration Scale Score 1.  Material does not enter airway: Thin liquids (Level 0); Mildly thick liquids (Level 2, nectar thick); Puree; Solid 2.  Material enters airway, remains ABOVE vocal cords then ejected out: Thin liquids (Level 0) Compensatory Strategies: Compensatory Strategies Compensatory strategies: No   General Information: Caregiver present: No  Diet Prior to this Study: Regular; Thin liquids (Level 0)   Temperature : Normal   Respiratory Status: WFL   Supplemental O2: Nasal cannula   History of Recent Intubation: No  Behavior/Cognition: Alert; Cooperative; Pleasant mood Self-Feeding Abilities: Able to self-feed Baseline vocal quality/speech: Normal No data recorded Volitional Swallow: Able to elicit Exam Limitations: No  limitations Goal Planning: Prognosis for improved oropharyngeal function: Good No data recorded No data recorded Patient/Family Stated Goal: none stated Consulted and agree with results and recommendations: Patient Pain: Pain Assessment Pain Assessment: No/denies pain End of Session: Start Time:SLP Start Time (ACUTE ONLY): 1316 Stop Time: SLP Stop Time (ACUTE ONLY): 1330 Time Calculation:SLP Time Calculation (min) (ACUTE ONLY): 14 min Charges: SLP Evaluations $ SLP Speech Visit: 1 Visit SLP Evaluations $BSS Swallow: 1 Procedure $MBS Swallow: 1 Procedure SLP visit diagnosis: SLP Visit Diagnosis: -- (r/o dysphagia) Past Medical History: Past Medical History: Diagnosis Date  Abduction deformity of foot, right 05/10/2018  Abscess 02/06/2022  Aneurysm of iliac artery 08/12/2016  Aortic root dilatation 08/27/2021  Aortic valve stenosis 10/14/2023  At risk for heart failure 01/29/2022  Bilateral carotid artery stenosis 08/12/2016  CAD (coronary artery disease) 01/11/2024  Candidal intertrigo 02/06/2022  Cellulitis of right leg 08/05/2021  Chronic diarrhea 05/26/2017  Chronic hypoxemic respiratory failure (HCC) 08/11/2023  Chronic obstructive lung disease (HCC) 08/27/2021  Chronic pain of both hips 12/29/2018  Chronic pain of both knees 07/11/2016  Chronic pain of both shoulders 12/29/2018  Chronic ulcer of right foot (HCC) 08/27/2021  Congestive heart failure (HCC) 07/06/2019  Continuous opioid  dependence (HCC) 08/10/2023  Contracture of both Achilles tendons 02/01/2018  Essential hypertension 02/10/2012  Hardening of the aorta (main artery of the heart) 12/03/2021  Heart murmur 09/03/2023  History of pulmonary embolism 08/27/2021  Joint pain 08/27/2021  Microalbuminuria due to type 2 diabetes mellitus (HCC) 07/25/2022  Mixed hyperlipidemia 07/11/2016  Morbid obesity (HCC) 07/06/2019  Obstructive lung disease (HCC) 07/06/2019  Obstructive sleep apnea (adult) (pediatric) 05/26/2017  Onychomycosis due to dermatophyte  09/28/2018  Osteoarthritis of right knee 12/24/2021  Other specified personal risk factors, not elsewhere classified 12/03/2021  Pre-ulcerative calluses 02/01/2018  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 (HCC) 08/27/2021  Pulmonary embolism (HCC) 07/06/2019  In 2005 after he broke both his legs in an accident. On coumadin for 3 months.     Last Assessment & Plan:   Relevant Hx:  Course:  Daily Update:  Today's Plan:    Shortness of breath 02/10/2012  Smoking addiction 01/11/2024  Statin not tolerated 12/31/2021  Tobacco abuse 07/06/2019  Active tobacco use 2 packs/day x 34 years    Type 2 diabetes mellitus (HCC) 02/10/2012  A. With retinopathy and neuropathy  B. Long-term use of insulin     Type 2 diabetes mellitus with diabetic neuropathy, with long-term current use of insulin  (HCC) 07/11/2016  Ulcer of midfoot due to diabetes mellitus (HCC) 12/03/2021  Venous insufficiency of right leg 08/05/2021  Venous stasis 08/12/2016 Past Surgical History: Past Surgical History: Procedure Laterality Date  IVC FILTER INSERTION  05/04/2024  [facility], [address] [place], [place] [address] Peyton JINNY Rummer 06/20/2024, 4:09 PM  DG Chest 2 View Result Date: 06/19/2024 EXAM: 2 VIEW(S) XRAY OF THE CHEST 06/19/2024 05:19:00 AM COMPARISON: 06/17/2024 CLINICAL HISTORY: Pneumonia FINDINGS: LINES, TUBES AND DEVICES: Electronic device in left upper chest. LUNGS AND PLEURA: Resolution of mild pulmonary edema. Small left pleural effusion and persistent left basilar opacity. No pneumothorax. HEART AND MEDIASTINUM: Cardiomegaly. BONES AND SOFT TISSUES: No acute osseous abnormality. IMPRESSION: 1. Resolution of mild pulmonary edema. 2. Small left pleural effusion with persistent left basilar opacity, which may reflect atelectasis or residual infection. Electronically signed by: Waddell Calk MD 06/19/2024 05:25 AM EST RP Workstation: [id number]   CT HEAD WO CONTRAST  ( ) Result Date: 06/18/2024 EXAM: CT HEAD WITHOUT CONTRAST 06/18/2024 02:23:20 AM TECHNIQUE: CT of the head was performed without the administration of intravenous contrast. Automated exposure control, iterative reconstruction, and/or weight based adjustment of the mA/kV was utilized to reduce the radiation dose to as low as reasonably achievable. COMPARISON: CT Head May 10, 2024 CLINICAL HISTORY: Stroke, follow up; History of CVA with hemorrhagic conversion, assessing for clearance of bleed FINDINGS: BRAIN AND VENTRICLES: Evolving right MCA territory infarct with decreased edema, decreased multifocal hemorrhage and decreased leftward midline shift (now trace). No evidence of new/interval infarct. No hydrocephalus. No mass effect or midline shift. ORBITS: No acute abnormality. SINUSES: No acute abnormality. SOFT TISSUES AND SKULL: No acute soft tissue abnormality. No skull fracture. IMPRESSION: 1. Evolving right MCA territory infarct with decreased edema, decreased multifocal hemorrhage and decreased leftward midline shift (now trace). Electronically signed by: Gilmore Molt MD 06/18/2024 02:31 AM EST RP Workstation: [id number]   DG Chest Port 1 View Result Date: 06/17/2024 EXAM: 1 VIEW(S) XRAY OF THE CHEST 06/17/2024 06:01:00 PM COMPARISON: Comparison with 05/07/2024. CLINICAL HISTORY: Questionable sepsis - evaluate for abnormality. FINDINGS: LUNGS AND PLEURA: Pulmonary vascular congestion and perihilar/peripheral interstitial changes, likely edema. This is developing since the previous study. Small left pleural  effusion with basilar atelectasis. No pneumothorax. HEART AND MEDIASTINUM: Cardiac enlargement. BONES AND SOFT TISSUES: No acute osseous abnormality. IMPRESSION: 1. Cardiomegaly with pulmonary vascular congestion and perihilar/peripheral interstitial changes, likely edema, developing since the prior study. 2. Small left pleural effusion with basilar atelectasis. 3. No pneumothorax. Electronically  signed by: Elsie Gravely MD 06/17/2024 06:12 PM EST RP Workstation: [id number]    Microbiology: Results for orders placed or performed during the hospital encounter of 06/17/24  Resp panel by RT-PCR (RSV, Flu A&B, Covid) Anterior Nasal Swab     Status: None   Collection Time: 06/17/24  5:27 PM   Specimen: Anterior Nasal Swab  Result Value Ref Range Status   SARS Coronavirus 2 by RT PCR NEGATIVE NEGATIVE Final   Influenza A by PCR NEGATIVE NEGATIVE Final   Influenza B by PCR NEGATIVE NEGATIVE Final    Comment: (NOTE) The Xpert Xpress SARS-CoV-2/FLU/RSV plus assay is intended as an aid in the diagnosis of influenza from Nasopharyngeal swab specimens and should not be used as a sole basis for treatment. Nasal washings and aspirates are unacceptable for Xpert Xpress SARS-CoV-2/FLU/RSV testing.  Fact Sheet for Patients: bloggercourse.com  Fact Sheet for Healthcare Providers: seriousbroker.it  This test is not yet approved or cleared by the [place]  FDA and has been authorized for detection and/or diagnosis of SARS-CoV-2 by FDA under an Emergency Use Authorization (EUA). This EUA will remain in effect (meaning this test can be used) for the duration of the COVID-19 declaration under Section 564(b)(1) of the Act, 21 U.S.C. section 360bbb-3(b)(1), unless the authorization is terminated or revoked.     Resp Syncytial Virus by PCR NEGATIVE NEGATIVE Final    Comment: (NOTE) Fact Sheet for Patients: bloggercourse.com  Fact Sheet for Healthcare Providers: seriousbroker.it  This test is not yet approved or cleared by the [place]  FDA and has been authorized for detection and/or diagnosis of SARS-CoV-2 by FDA under an Emergency Use Authorization (EUA). This EUA will remain in effect (meaning this test can be used) for the duration of the COVID-19 declaration under Section  564(b)(1) of the Act, 21 U.S.C. section 360bbb-3(b)(1), unless the authorization is terminated or revoked.  Performed at [facility], [address] [place], [place] [address]   Blood Culture (routine x 2)     Status: None   Collection Time: 06/17/24  5:27 PM   Specimen: BLOOD  Result Value Ref Range Status   Specimen Description BLOOD LEFT ANTECUBITAL  Final   Special Requests   Final    BOTTLES DRAWN AEROBIC AND ANAEROBIC Blood Culture adequate volume   Culture   Final    NO GROWTH 5 DAYS Performed at [facility], [address], [place], [place] [address]    Report Status 06/22/2024 FINAL  Final  Respiratory (~20 pathogens) panel by PCR     Status: Abnormal   Collection Time: 06/17/24  5:27 PM   Specimen: Nasopharyngeal Swab; Respiratory  Result Value Ref Range Status   Adenovirus NOT DETECTED NOT DETECTED Final   Coronavirus 229E NOT DETECTED NOT DETECTED Final    Comment: (NOTE) The Coronavirus on the Respiratory Panel, DOES NOT test for the novel  Coronavirus (2019 nCoV)    Coronavirus HKU1 NOT DETECTED NOT DETECTED Final   Coronavirus NL63 NOT DETECTED NOT DETECTED Final   Coronavirus OC43 NOT DETECTED NOT DETECTED Final   Metapneumovirus NOT DETECTED NOT DETECTED Final   Rhinovirus / Enterovirus DETECTED (A) NOT DETECTED Final   Influenza A  NOT DETECTED NOT DETECTED Final   Influenza B NOT DETECTED NOT DETECTED Final   Parainfluenza Virus 1 NOT DETECTED NOT DETECTED Final   Parainfluenza Virus 2 NOT DETECTED NOT DETECTED Final   Parainfluenza Virus 3 NOT DETECTED NOT DETECTED Final   Parainfluenza Virus 4 NOT DETECTED NOT DETECTED Final   Respiratory Syncytial Virus NOT DETECTED NOT DETECTED Final   Bordetella pertussis NOT DETECTED NOT DETECTED Final   Bordetella Parapertussis NOT DETECTED NOT DETECTED Final   Chlamydophila pneumoniae NOT DETECTED NOT DETECTED Final   Mycoplasma pneumoniae NOT DETECTED NOT DETECTED Final    Comment: Performed at  [facility], [address], [place], [place] [address]  Blood Culture (routine x 2)     Status: None   Collection Time: 06/17/24  5:32 PM   Specimen: BLOOD RIGHT FOREARM  Result Value Ref Range Status   Specimen Description BLOOD RIGHT FOREARM  Final   Special Requests   Final    AEROBIC BOTTLE ONLY Blood Culture results may not be optimal due to an inadequate volume of blood received in culture bottles   Culture   Final    NO GROWTH 5 DAYS Performed at [facility], [address] [place], [place] [address]    Report Status 06/22/2024 FINAL  Final  MRSA Next Gen by PCR, Nasal     Status: None   Collection Time: 06/18/24  2:43 AM   Specimen: Nasopharyngeal Swab; Nasal Swab  Result Value Ref Range Status   MRSA by PCR Next Gen NOT DETECTED NOT DETECTED Final    Comment: (NOTE) The GeneXpert MRSA Assay (FDA approved for NASAL specimens only), is one component of a comprehensive MRSA colonization surveillance program. It is not intended to diagnose MRSA infection nor to guide or monitor treatment for MRSA infections. Test performance is not FDA approved in patients less than 50 years old. Performed at [facility], [address] [place], [place] [address]     Labs: CBC: Recent Labs  Lab 06/17/24 1727 06/18/24 0352 06/19/24 0428 06/20/24 0422 06/21/24 0407 06/22/24 0237  WBC 9.8 10.3 6.2 6.8 6.6 7.3  NEUTROABS 8.2*  --  3.7 3.9 3.6 3.8  HGB 13.7 12.0* 12.0* 13.5 12.9* 13.3  HCT 39.9 35.3* 35.2* 39.4 37.8* 38.9*  MCV 93.2 92.4 92.4 91.0 89.8 90.7  PLT 205 176 163 202 230 258   Basic Metabolic Panel: Recent Labs  Lab 06/18/24 0352 06/19/24 0428 06/20/24 0422 06/21/24 0407 06/22/24 0237  NA 134* 140 138 137 137  K 3.2* 3.1* 3.1* 3.8 3.6  CL 97* 101 98 99 99  CO2 24 24 27 27 27   GLUCOSE 192* 142* 139* 149* 168*  BUN 6* 7* 7* 10 11  CREATININE 0.68 0.61 0.60* 0.80 0.89  CALCIUM  8.1* 8.0* 8.4* 8.4* 8.6*  MG 1.1* 1.2* 1.5* 1.7 1.5*   Liver  Function Tests: Recent Labs  Lab 06/17/24 1727 06/20/24 0422 06/21/24 0407 06/22/24 0237  AST 22 21 17 18   ALT 16 15 14 17   ALKPHOS 70 52 51 51  BILITOT 1.3* 1.6* 1.2 0.9  PROT 6.6 6.2* 6.1* 6.2*  ALBUMIN 3.2* 2.7* 2.8* 2.9*   CBG: Recent Labs  Lab 06/21/24 1151 06/21/24 1651 06/21/24 2021 06/22/24 0745 06/22/24 1131  GLUCAP 178* 162* 174* 170* 156*    Discharge time spent: greater than 30 minutes.  Signed: Owen DELENA Lore, MD [facility] 06/22/2024

## 2024-06-22 NOTE — Progress Notes (Signed)
 Darren Hunt to be D/C'd Home per MD order.  Discussed with the patient and all questions fully answered.  IV catheters discontinued intact. Site without signs and symptoms of complications. Dressing and pressure applied.  An After Visit Summary was printed and given to the patient. Patient received nebulizer. Patient prescriptions sent to his pharmacy.  D/c education completed with patient/family including follow up instructions, medication list, d/c activities limitations if indicated, with other d/c instructions as indicated by MD - patient able to verbalize understanding, all questions fully answered.   Patient instructed to return to ED, call 911, or call MD for any changes in condition.   Patient escorted via WC, and D/C home via private auto.  Darren Hunt 06/22/2024 1:04 PM

## 2024-06-22 NOTE — TOC Transition Note (Signed)
 Transition of Care [facility]) - Discharge Note   Patient Details  Name: Darren Hunt MRN: [id number] Date of Birth: [birth year]  [facility] CM/SW Contact:  Tom-Johnson, Harvest Muskrat, RN Phone Number: 06/22/2024, 11:48 AM   Clinical Narrative:     Patient is scheduled for discharge today.  Readmission Risk Assessment done. Home health info, Outpatient f/u, hospital f/u and discharge instructions on AVS. Nebulizer Machine ordered from [employer] and [facility] to deliver to patient at bedside.  Wife, [name] to transport at discharge.  No further ICM needs noted.       Final next level of care: Home w Home Health Services Barriers to Discharge: Barriers Resolved   Patient Goals and CMS Choice Patient states their goals for this hospitalization and ongoing recovery are:: To return home CMS Medicare.gov Compare Post Acute Care list provided to:: Patient Choice offered to / list presented to : Patient      Discharge Placement                Patient to be transferred to facility by: Wife Name of family member notified: [facility]    Discharge Plan and Services Additional resources added to the After Visit Summary for                  DME Arranged: Nebulizer machine DME Agency: [employer] Date DME Agency Contacted: 06/22/24 Time DME Agency Contacted: 1132 Representative spoke with at DME Agency: [facility] HH Arranged: PT, OT, RN, Speech Therapy HH Agency: [facility] Date [facility] Agency Contacted: 06/20/24 Time HH Agency Contacted: 1605 Representative spoke with at [facility] Agency: Darleene  Social Drivers of Health (SDOH) Interventions SDOH Screenings   Food Insecurity: No Food Insecurity (06/18/2024)  Housing: Low Risk  (06/18/2024)  Transportation Needs: No Transportation Needs (06/18/2024)  Utilities: Not At Risk (06/18/2024)  Social Connections: Socially Integrated (06/18/2024)  Recent Concern: Social Connections - Moderately Isolated (05/08/2024)   Tobacco Use: High Risk (06/17/2024)     Readmission Risk Interventions    06/20/2024    3:35 PM  Readmission Risk Prevention Plan  Transportation Screening Complete  PCP or Specialist Appt within 5-7 Days Complete  Home Care Screening Complete  Medication Review (RN CM) Referral to Pharmacy

## 2024-06-22 NOTE — Progress Notes (Signed)
 Physical Therapy Treatment Patient Details Name: Darren Hunt MRN: [id number] DOB: 1950/11/22 Today's Date: 06/22/2024   History of Present Illness Pt is a 73 y.o. male admitted 11/21 with sepsis. 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 Min assist bed mobility, mod assist sit to stand in stedy, and dependent stedy transfer bed to recliner. Pt in recliner with feet elevated at end of session. Current POC remains appropriate.     If plan is discharge home, recommend the following: A lot of help with walking and/or transfers;A lot of help with bathing/dressing/bathroom;Help with stairs or ramp for entrance;Assistance with cooking/housework   Can travel by private vehicle        Equipment Recommendations  None recommended by PT    Recommendations for Other Services       Precautions / Restrictions Precautions Precautions: Fall;Other (comment) Precaution/Restrictions Comments: foley, L hemi     Mobility  Bed Mobility Overal bed mobility: Needs Assistance Bed Mobility: Supine to Sit     Supine to sit: Min assist, Used rails, HOB elevated     General bed mobility comments: increased time    Transfers Overall transfer level: Needs assistance   Transfers: Sit to/from Stand, Bed to chair/wheelchair/BSC Sit to Stand: Mod assist           General transfer comment: mod assist to power up in stedy. Cues for sequencing and hand placement. Dependent stedy transfer bed to recliner. Transfer via Lift Equipment: Stedy  Ambulation/Gait                   Stairs             Wheelchair  Mobility     Tilt Bed    Modified Rankin (Stroke Patients Only)       Balance Overall balance assessment: Needs assistance Sitting-balance support: Feet supported, No upper extremity supported Sitting balance-Leahy Scale: Good     Standing balance support: During functional activity, Reliant on assistive device for balance Standing balance-Leahy Scale: Poor                              Communication Communication Communication: No apparent difficulties  Cognition Arousal: Alert Behavior During Therapy: WFL for tasks assessed/performed   PT - Cognitive impairments: No family/caregiver present to determine baseline, Memory, Problem solving, Safety/Judgement, Sequencing                         Following commands: Intact      Cueing Cueing Techniques: Verbal cues  Exercises      General Comments General comments (skin integrity, edema, etc.): VSS on RA      Pertinent Vitals/Pain Pain Assessment Pain Assessment: No/denies pain    Home Living                          Prior Function            PT Goals (current goals can now be found in the care plan section) Acute Rehab PT Goals  Patient Stated Goal: home Progress towards PT goals: Progressing toward goals    Frequency    Min 2X/week      PT Plan      Co-evaluation              AM-PAC PT 6 Clicks Mobility   Outcome Measure  Help needed turning from your back to your side while in a flat bed without using bedrails?: A Little Help needed moving from lying on your back to sitting on the side of a flat bed without using bedrails?: A Little Help needed moving to and from a bed to a chair (including a wheelchair)?: Total Help needed standing up from a chair using your arms (e.g., wheelchair or bedside chair)?: A Lot Help needed to walk in hospital room?: Total Help needed climbing 3-5 steps with a railing? : Total 6 Click Score: 11    End of Session Equipment  Utilized During Treatment: Gait belt Activity Tolerance: Patient tolerated treatment well Patient left: in chair;with call bell/phone within reach;with chair alarm set Nurse Communication: Mobility status PT Visit Diagnosis: Other abnormalities of gait and mobility (R26.89);Muscle weakness (generalized) (M62.81)     Time: 9164-9141 PT Time Calculation (min) (ACUTE ONLY): 23 min  Charges:    $Therapeutic Activity: 23-37 mins PT General Charges $$ ACUTE PT VISIT: 1 Visit                     Sari MATSU., PT  Office # [phone]    Darren Hunt 06/22/2024, 9:16 AM

## 2024-07-07 NOTE — Telephone Encounter (Signed)
 done

## 2024-07-07 NOTE — Addendum Note (Signed)
 Addended by: HILLIARD HEATHER CROME on: 07/07/2024 05:09 PM   Modules accepted: Orders

## 2024-07-08 ENCOUNTER — Ambulatory Visit: Admitting: Cardiology

## 2024-07-15 ENCOUNTER — Ambulatory Visit: Attending: Neurology

## 2024-07-18 ENCOUNTER — Ambulatory Visit: Payer: Self-pay | Admitting: Neurology

## 2024-07-18 DIAGNOSIS — I63411 Cerebral infarction due to embolism of right middle cerebral artery: Secondary | ICD-10-CM

## 2024-08-08 DIAGNOSIS — F17210 Nicotine dependence, cigarettes, uncomplicated: Secondary | ICD-10-CM | POA: Insufficient documentation

## 2024-08-08 DIAGNOSIS — Z794 Long term (current) use of insulin: Secondary | ICD-10-CM | POA: Insufficient documentation

## 2024-08-15 ENCOUNTER — Ambulatory Visit

## 2024-08-15 VITALS — BP 140/66 | HR 88 | Ht 73.0 in | Wt 279.1 lb

## 2024-08-15 DIAGNOSIS — E782 Mixed hyperlipidemia: Secondary | ICD-10-CM | POA: Diagnosis not present

## 2024-08-15 DIAGNOSIS — I350 Nonrheumatic aortic (valve) stenosis: Secondary | ICD-10-CM | POA: Diagnosis not present

## 2024-08-15 DIAGNOSIS — Z9889 Other specified postprocedural states: Secondary | ICD-10-CM

## 2024-08-15 DIAGNOSIS — Z8673 Personal history of transient ischemic attack (TIA), and cerebral infarction without residual deficits: Secondary | ICD-10-CM

## 2024-08-15 DIAGNOSIS — Z95828 Presence of other vascular implants and grafts: Secondary | ICD-10-CM

## 2024-08-15 DIAGNOSIS — I2510 Atherosclerotic heart disease of native coronary artery without angina pectoris: Secondary | ICD-10-CM

## 2024-08-15 DIAGNOSIS — R221 Localized swelling, mass and lump, neck: Secondary | ICD-10-CM | POA: Insufficient documentation

## 2024-08-15 MED ORDER — APIXABAN 5 MG PO TABS: 5.0000 mg | ORAL_TABLET | Freq: Two times a day (BID) | ORAL | 3 refills | Status: AC

## 2024-08-15 NOTE — Assessment & Plan Note (Signed)
 Continue atorvastatin  40 mg once daily and Zetia  10 mg once daily. Lipid panel 05/08/2024 total cholesterol 62, HDL 22, LDL 25 and triglycerides 74. Was an acute inpatient setting. Will follow-up tentatively in 6 months at his next visit.

## 2024-08-15 NOTE — Assessment & Plan Note (Signed)
 Left side of the neck, about the left clavicle swelling mobile, nontender, 2 x 3 cm in size, nonpulsatile.  Noticed by patient few weeks ago, unchanged in size. Advised to continue monitoring and follow-up closely with his PCP.

## 2024-08-15 NOTE — Assessment & Plan Note (Signed)
 Last cath at [facility] from August 2013 reportedly CTO RCA with distal left to right collaterals. Right heart cath at the time with cardiac index 2.7, PCWP 17 and mean PA pressure 24 mmHg.   Functional status previously limited due to COPD and morbid obesity. Now further limited after his recent stroke with left hemiparesis.  Not on aspirin  at this time since starting Eliquis  in November 2025. Tolerating well. Remains on atorvastatin  40 mg once daily, tolerating well. Also on Zetia  10 mg once daily.

## 2024-08-15 NOTE — Assessment & Plan Note (Addendum)
 History of CVA September 2025 while in [place] . Left hand paresis and hemianopsia. S/p thrombolytics in [place] . Hemorrhagic conversion. Further complicated with DVT/PE. S/p IVC filter placement.  Has been started on Eliquis  5 mg twice daily in November after clearance from neurologist with regards to follow-up CT brain imaging.  Has been off aspirin .  30-day event monitor was prematurely stopped as he was readmitted.  Will repeat 30-day event monitor to rule out any significant atrial arrhythmias.  Will refill his Eliquis  5 mg twice daily.

## 2024-08-15 NOTE — Progress Notes (Signed)
 "  Cardiology Consultation:    Date:  08/15/2024   ID:  Darren Hunt, DOB [birth year], MRN [id number]  PCP:  Darren Columbus, FNP  Cardiologist:  Darren JONELLE Kobus, MD   Referring MD: Darren Columbus, FNP   No chief complaint on file.    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  Inpatient stay also noted with left popliteal DVT and small right lower lobe PE on CT chest, s/p IVC filter placement September 2025 in [place] .  Anticoagulants were held.  Has residual left-sided weakness and peripheral vision loss.  Neurology on follow-up visit 06/08/2024 planned resuming anticoagulation if hemorrhagic changes on follow-up CT imaging resolved for both DVT/PE and secondary stroke prevention.   Had another admission at [facility] between 06/17/2024 through 06/22/2024 for sepsis in the setting of pneumonia.  Eliquis  for anticoagulation started during his inpatient stay prior to discharge.  Here for follow-up visit.  Problem List Items Addressed This Visit       Cardiovascular and Mediastinum   Aortic valve stenosis    Moderate aortic stenosis, last echocardiogram at [facility] 05/09/2024 V-max 3.5 m/s, mean gradient 32 mmHg.  [V-max was higher on a prior echocardiogram]  Continue follow-up.  Will tentatively schedule for repeat echocardiogram in June 2026 keep close watch given the discrepancy and lower velocities on recent aortic valve V-max in comparison to prior study.  Reviewed potential interventions for severe aortic stenosis.      CAD (coronary artery disease) - Primary   Last cath at [facility] from August 2013 reportedly CTO RCA with distal left to right collaterals. Right heart cath at the time with cardiac index 2.7, PCWP 17 and mean PA pressure 24 mmHg.   Functional status previously limited due to COPD and morbid obesity. Now further limited after his recent stroke with left hemiparesis.  Not on aspirin  at this time since starting Eliquis  in November 2025. Tolerating well. Remains on atorvastatin  40 mg once daily, tolerating well. Also on Zetia  10 mg once daily.          Other   Mixed hyperlipidemia   Continue atorvastatin  40 mg once daily and Zetia  10 mg once daily. Lipid panel 05/08/2024 total cholesterol 62, HDL 22, LDL 25 and triglycerides 74. Was an acute inpatient setting. Will follow-up tentatively in 6 months at his next visit.       History of common carotid artery stent placement   Will follow-up with his vascular surgeon.       History of CVA (cerebrovascular accident)   History of CVA September 2025 while in [place] . Left hand paresis and hemianopsia. S/p thrombolytics in [place] . Hemorrhagic conversion. Further complicated with DVT/PE. S/p IVC filter placement.  Has been started on Eliquis  5  mg twice daily in November after clearance from neurologist with regards to follow-up CT brain imaging.  Has been off aspirin .  30-day event monitor was prematurely stopped as he was readmitted.  Will repeat 30-day event monitor to rule out any  significant atrial arrhythmias.  Will refill his Eliquis  5 mg twice daily.       Localized swelling, mass and lump, neck   Left side of the neck, about the left clavicle swelling mobile, nontender, 2 x 3 cm in size, nonpulsatile.  Noticed by patient few weeks ago, unchanged in size. Advised to continue monitoring and follow-up closely with his PCP.       Return to clinic tentatively in 6 months.   History of Present Illness:    Darren Hunt is a 74 y.o. male who is being seen today for follow-up visit. PCP is Chrisco, Salomon, FNP. Last visit with me in the office was 01/11/2024. Follows up with vascular surgery Dr. Serene last visit 03/14/2024. Followed up with neurologist Dr. Rosemarie.  Here for the visit today accompanied by his wife and daughter.  Currently using a wheelchair for ambulation.  Undergoing physical therapy and Occupational Therapy at home.  Using a bedside commode due to small door to his restroom.  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  Recent CVA left-sided weakness and hemianopsia while in [place]  September/2025 s/p thrombolytic therapy and hemorrhagic transformation, transferred to [facility] and discharged 05/06/2024.  Inpatient stay also noted with left popliteal DVT and small right lower lobe PE on CT chest, s/p IVC filter placement September 2025 in [place] .  Anticoagulants were held.   Has residual left-sided weakness and peripheral vision loss.  Neurology on follow-up visit 06/08/2024 planned resuming anticoagulation if hemorrhagic changes on follow-up CT imaging resolved for both DVT/PE and secondary stroke prevention.   Had another admission at [facility] between 06/17/2024 through 06/22/2024 for sepsis in the setting of pneumonia.  Eliquis  for anticoagulation started during his inpatient stay prior to discharge.  Recent heart monitor from 06/13/2024, 30-day study but data available for only 7 days noted [as he got readmitted to the hospital on 06/17/2024] predominantly sinus rhythm, average heart rate 86/min, no evidence of atrial fibrillation.  7 auto triggered events, 1 of which correlated with a short run of SVT lasting 13 beats.  No high-grade AV blocks or sustained arrhythmias.  Rare ventricular and supraventricular ectopy burden less than 1%.  Ultrasound carotids 03/14/2024 with external carotid artery left greater than 50% stenosis, patent stent.  Right internal carotid less than 40% stenosis.  Left subclavian artery stenotic. Vascular bilateral ABIs 03/14/2024 abnormal toe brachial indicis.  Pleasant man.  Here for the visit today accompanied by his family members. Denies any cardiac symptoms of chest pain, shortness of breath, orthopnea or paroxysmal nocturnal dyspnea. Bilateral lower extremity edema well-controlled on current dose of Lasix  furosemide  40 mg once daily.  Denies any palpitations, lightheadedness. Left side upper and lower extremity weakness has gradually improved, significant  improvement in his lower extremity compared to the upper.  Has residual left peripheral vision loss. Denies any diplopia, vertigo.  Denies any blood in urine or stools. Denies any falls.  Good compliance with medications. Requesting refill for his Eliquis  5 mg twice daily.  Has been nontender, nonpulsatile small 2 x 3 cm swelling over the left clavicular region, mobile,  appears superficial, lipoma versus lymph node.  noticed that for couple weeks.  No significant increase in size.  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   Aortic root dilatation 08/27/2021   Aortic valve stenosis 10/14/2023   At risk for heart failure 01/29/2022   Bilateral carotid artery stenosis 08/12/2016   CAD (coronary artery disease) 01/11/2024   Candidal intertrigo 02/06/2022   Cellulitis of right leg 08/05/2021   Chronic diarrhea 05/26/2017   Chronic hypoxemic respiratory failure (HCC) 08/11/2023   Chronic obstructive lung disease (HCC) 08/27/2021   Chronic pain of both hips 12/29/2018   Chronic pain of both knees 07/11/2016   Chronic pain of both shoulders 12/29/2018   Chronic ulcer of right foot (HCC) 08/27/2021   Congestive heart failure (HCC) 07/06/2019   Continuous opioid dependence (HCC) 08/10/2023   Contracture of both Achilles tendons 02/01/2018   Essential hypertension 02/10/2012   Hardening of the aorta (main artery of the heart) 12/03/2021   Heart murmur 09/03/2023   History of pulmonary embolism 08/27/2021   Joint pain 08/27/2021   Microalbuminuria due to type 2 diabetes mellitus (HCC) 07/25/2022   Mixed hyperlipidemia 07/11/2016   Morbid obesity (HCC) 07/06/2019   Obstructive lung disease (HCC) 07/06/2019   Obstructive sleep apnea (adult) (pediatric) 05/26/2017   Onychomycosis due to dermatophyte 09/28/2018   Osteoarthritis of right knee 12/24/2021   Other specified personal risk factors, not elsewhere classified 12/03/2021   Pre-ulcerative calluses 02/01/2018   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 On coumadin for 3 months.     Last Assessment & Plan:   Relevant Hx:  Course:  Daily Update:   Today's Plan:     Shortness of breath 02/10/2012   Smoking addiction 01/11/2024   Statin not tolerated 12/31/2021   Tobacco abuse 07/06/2019   Active tobacco use 2 packs/day x 34 years     Type 2 diabetes mellitus (HCC) 02/10/2012   A. With retinopathy and neuropathy  B. Long-term use of insulin      Type 2 diabetes mellitus with diabetic neuropathy, with long-term current use of insulin  (HCC) 07/11/2016   Ulcer of midfoot due to diabetes mellitus (HCC) 12/03/2021   Venous insufficiency of right leg 08/05/2021   Venous stasis 08/12/2016    Past Surgical History:  Procedure Laterality Date   IVC FILTER INSERTION  05/04/2024   [facility], [address] [place], [place] [address]    Current Medications: Active Medications[1]   Allergies:   Patient has no known allergies.   Social History   Socioeconomic History   Marital status: Married    Spouse name: Not on file   Number of children: Not on file   Years of education: Not on file   Highest education level: Not on file  Occupational History   Not on file  Tobacco Use   Smoking status: Every Day  Current packs/day: 2.00    Types: Cigarettes   Smokeless tobacco: Never  Vaping Use   Vaping status: Never Used  Substance and Sexual Activity   Alcohol use: Not on file   Drug use: Not on file   Sexual activity: Not on file  Other Topics Concern   Not on file  Social History Narrative   Not on file   Social Drivers of Health   Tobacco Use: High Risk (08/15/2024)   Patient History    Smoking Tobacco Use: Every Day    Smokeless Tobacco Use: Never    Passive Exposure: Not on file  Financial Resource Strain: Not on file  Food Insecurity: No Food Insecurity (06/18/2024)   Epic    Worried About Programme Researcher, Broadcasting/film/video in the Last Year: Never true    Ran Out of Food in the Last Year: Never true  Transportation Needs: No Transportation Needs (06/18/2024)   Epic    Lack of Transportation (Medical): No    Lack  of Transportation (Non-Medical): No  Physical Activity: Not on file  Stress: Not on file  Social Connections: Socially Integrated (06/18/2024)   Social Connection and Isolation Panel    Frequency of Communication with Friends and Family: Three times a week    Frequency of Social Gatherings with Friends and Family: Twice a week    Attends Religious Services: More than 4 times per year    Active Member of [employer] or Organizations: Yes    Attends Banker Meetings: 1 to 4 times per year    Marital Status: Married  Recent Concern: Social Connections - Moderately Isolated (05/08/2024)   Social Connection and Isolation Panel    Frequency of Communication with Friends and Family: Three times a week    Frequency of Social Gatherings with Friends and Family: Twice a week    Attends Religious Services: Never    Database Administrator or Organizations: No    Attends Banker Meetings: Never    Marital Status: Married  Depression (PHQ2-9): Not on file  Alcohol Screen: Not on file  Housing: Low Risk (06/18/2024)   Epic    Unable to Pay for Housing in the Last Year: No    Number of Times Moved in the Last Year: 0    Homeless in the Last Year: No  Utilities: Not At Risk (06/18/2024)   Epic    Threatened with loss of utilities: No  Health Literacy: Not on file     Family History: The patient's family history is not on file. ROS:   Please see the history of present illness.    All 14 point review of systems negative except as described per history of present illness.  EKGs/Labs/Other Studies Reviewed:    The following studies were reviewed today:   EKG:       Recent Labs: 06/17/2024: B Natriuretic Peptide 504.3 06/22/2024: ALT 17; BUN 11; Creatinine, Ser 0.89; Hemoglobin 13.3; Magnesium  1.5; Platelets 258; Potassium 3.6; Sodium 137  Recent Lipid Panel    Component Value Date/Time   CHOL 62 05/08/2024 0512   TRIG 74 05/08/2024 0512   HDL 22 (L) 05/08/2024 0512    CHOLHDL 2.8 05/08/2024 0512   VLDL 15 05/08/2024 0512   LDLCALC 25 05/08/2024 0512    Physical Exam:    VS:  BP (!) 140/66   Pulse 88   Ht 6' 1 (1.854 m)   Wt 279 lb 1.9 oz (126.6 kg)   BMI 36.83  kg/m     Wt Readings from Last 3 Encounters:  08/15/24 279 lb 1.9 oz (126.6 kg)  06/22/24 293 lb 10.4 oz (133.2 kg)  05/07/24 (!) 330 lb (149.7 kg)     GENERAL:  Well nourished, well developed in no acute distress Calm and comfortable. NECK: No JVD; No carotid bruits CARDIAC: RRR, S1 and S2 present, 4/6 harsh ejection systolic murmur. CHEST:  Clear to auscultation without rales, wheezing or rhonchi  Extremities: Bilateral lower extremity skin changes consistent with venous insufficiency.  Pulses bilaterally symmetric with radial 2+ and dorsalis pedis 2+.   NEUROLOGIC:  Alert and oriented x 3.  Left upper and lower extremity strength decreased compared to the right side.  Medication Adjustments/Labs and Tests Ordered: Current medicines are reviewed at length with the patient today.  Concerns regarding medicines are outlined above.  No orders of the defined types were placed in this encounter.  No orders of the defined types were placed in this encounter.   Signed, Darren reddy Adriell Polansky, MD, MPH, [facility]. 08/15/2024 2:03 PM    [facility]     [1]  Current Meds  Medication Sig   Acetaminophen  (TYLENOL  PO) Take 2 tablets by mouth as needed.   albuterol  (VENTOLIN  HFA) 108 (90 Base) MCG/ACT inhaler Inhale 2 puffs into the lungs every 4 (four) hours as needed for wheezing or shortness of breath.   amantadine  (SYMMETREL ) 100 MG capsule Take 100 mg by mouth daily.   apixaban  (ELIQUIS ) 5 MG TABS tablet Take 1 tablet (5 mg total) by mouth 2 (two) times daily.   atorvastatin  (LIPITOR) 40 MG tablet Take 1 tablet (40 mg total) by mouth daily.   Cetirizine HCl (ZYRTEC PO) Take 1 tablet by mouth daily.   DULoxetine  (CYMBALTA ) 30 MG capsule Take 1 capsule (30 mg  total) by mouth daily.   ezetimibe  (ZETIA ) 10 MG tablet Take 10 mg by mouth daily.   fluticasone  (FLONASE ) 50 MCG/ACT nasal spray Place 1 spray into both nostrils daily.   furosemide  (LASIX ) 40 MG tablet Take 1 tablet (40 mg total) by mouth daily.   HYDROcodone -acetaminophen  (NORCO) 7.5-325 MG tablet Take 1 tablet by mouth 3 (three) times daily as needed for moderate pain (pain score 4-6).   ipratropium-albuterol  (DUONEB) 0.5-2.5 (3) MG/3ML SOLN Take 3 mLs by nebulization every 6 (six) hours as needed.   magnesium  oxide (MAG-OX) 400 (240 Mg) MG tablet Take 1 tablet (400 mg total) by mouth 2 (two) times daily.   metFORMIN  (GLUCOPHAGE -XR) 500 MG 24 hr tablet Take 500 mg by mouth 2 (two) times daily.   pantoprazole  (PROTONIX ) 40 MG tablet Take 40 mg by mouth daily.   potassium chloride  SA (KLOR-CON  M) 20 MEQ tablet Take 1 tablet (20 mEq total) by mouth daily.   tamsulosin (FLOMAX) 0.4 MG CAPS capsule Take 0.4 mg by mouth daily.   umeclidinium bromide  (INCRUSE ELLIPTA ) 62.5 MCG/ACT AEPB Inhale 1 puff into the lungs daily.   "

## 2024-08-15 NOTE — Patient Instructions (Signed)
 Medication Instructions:  Your physician recommends that you continue on your current medications as directed. Please refer to the Current Medication list given to you today.  *If you need a refill on your cardiac medications before your next appointment, please call your pharmacy*  Lab Work: None If you have labs (blood work) drawn today and your tests are completely normal, you will receive your results only by: MyChart Message (if you have MyChart) OR A paper copy in the mail If you have any lab test that is abnormal or we need to change your treatment, we will call you to review the results.  Testing/Procedures: Your physician has requested that you have an echocardiogram. Echocardiography is a painless test that uses sound waves to create images of your heart. It provides your doctor with information about the size and shape of your heart and how well your hearts chambers and valves are working. This procedure takes approximately one hour. There are no restrictions for this procedure. Please do NOT wear cologne, perfume, aftershave, or lotions (deodorant is allowed). Please arrive 15 minutes prior to your appointment time.  Please note: We ask at that you not bring children with you during ultrasound (echo/ vascular) testing. Due to room size and safety concerns, children are not allowed in the ultrasound rooms during exams. Our front office staff cannot provide observation of children in our lobby area while testing is being conducted. An adult accompanying a patient to their appointment will only be allowed in the ultrasound room at the discretion of the ultrasound technician under special circumstances. We apologize for any inconvenience.  Your physician has recommended that you wear an event monitor. Event monitors are medical devices that record the hearts electrical activity. Doctors most often us  these monitors to diagnose arrhythmias. Arrhythmias are problems with the speed or  rhythm of the heartbeat. The monitor is a small, portable device. You can wear one while you do your normal daily activities. This is usually used to diagnose what is causing palpitations/syncope (passing out).   Follow-Up: At [facility], you and your health needs are our priority.  As part of our continuing mission to provide you with exceptional heart care, our providers are all part of one team.  This team includes your primary Cardiologist (physician) and Advanced Practice Providers or APPs (Physician Assistants and Nurse Practitioners) who all work together to provide you with the care you need, when you need it.  Your next appointment:   6 month(s)  Provider:   Alean Kobus, MD    We recommend signing up for the patient portal called MyChart.  Sign up information is provided on this After Visit Summary.  MyChart is used to connect with patients for Virtual Visits (Telemedicine).  Patients are able to view lab/test results, encounter notes, upcoming appointments, etc.  Non-urgent messages can be sent to your provider as well.   To learn more about what you can do with MyChart, go to [email or web address].   Other Instructions None

## 2024-08-15 NOTE — Assessment & Plan Note (Signed)
 Moderate aortic stenosis, last echocardiogram at [facility] 05/09/2024 V-max 3.5 m/s, mean gradient 32 mmHg.  [V-max was higher on a prior echocardiogram]  Continue follow-up.  Will tentatively schedule for repeat echocardiogram in June 2026 keep close watch given the discrepancy and lower velocities on recent aortic valve V-max in comparison to prior study.  Reviewed potential interventions for severe aortic stenosis.

## 2024-08-15 NOTE — Assessment & Plan Note (Signed)
 Will follow-up with his vascular surgeon.

## 2024-08-17 ENCOUNTER — Ambulatory Visit

## 2024-08-17 DIAGNOSIS — Z8673 Personal history of transient ischemic attack (TIA), and cerebral infarction without residual deficits: Secondary | ICD-10-CM

## 2024-08-17 DIAGNOSIS — Z95828 Presence of other vascular implants and grafts: Secondary | ICD-10-CM

## 2024-08-17 DIAGNOSIS — I2510 Atherosclerotic heart disease of native coronary artery without angina pectoris: Secondary | ICD-10-CM

## 2024-08-17 DIAGNOSIS — I350 Nonrheumatic aortic (valve) stenosis: Secondary | ICD-10-CM

## 2024-08-17 DIAGNOSIS — E782 Mixed hyperlipidemia: Secondary | ICD-10-CM

## 2024-08-17 DIAGNOSIS — R221 Localized swelling, mass and lump, neck: Secondary | ICD-10-CM

## 2024-08-18 ENCOUNTER — Telehealth: Payer: Self-pay

## 2024-08-18 NOTE — Patient Outreach (Signed)
 Telephone outreach to patient's Wife to obtain mRS was successfully completed. MRS= 5  Shereen Gin [facility] VBCI Assistant Direct Dial: [phone]  Fax: [phone] Website: [email or web address]

## 2024-08-18 NOTE — Telephone Encounter (Signed)
 Daugher Lessie) wants a call back regarding patient's heart monitor.

## 2024-08-18 NOTE — Telephone Encounter (Signed)
 Spoke with Arland per DPR who states that a screen came up on the monitor phone regarding testing. Advised that she needed to call preventice for guidance. Arland verbalized understanding and had no additional questions.
